# Patient Record
Sex: Male | Born: 1960 | Race: Asian | Hispanic: No | Marital: Single | Smoking: Current every day smoker
Health system: Southern US, Community
[De-identification: ages and names within clinical notes are randomized; demographics above are authoritative.]

## PROBLEM LIST (undated history)

## (undated) DIAGNOSIS — B20 Human immunodeficiency virus [HIV] disease: Secondary | ICD-10-CM

## (undated) DIAGNOSIS — A150 Tuberculosis of lung: Secondary | ICD-10-CM

## (undated) DIAGNOSIS — R519 Headache, unspecified: Secondary | ICD-10-CM

## (undated) DIAGNOSIS — M1990 Unspecified osteoarthritis, unspecified site: Secondary | ICD-10-CM

## (undated) DIAGNOSIS — Z21 Asymptomatic human immunodeficiency virus [HIV] infection status: Secondary | ICD-10-CM

## (undated) DIAGNOSIS — E119 Type 2 diabetes mellitus without complications: Secondary | ICD-10-CM

## (undated) HISTORY — DX: Type 2 diabetes mellitus without complications: E11.9

## (undated) HISTORY — DX: Unspecified osteoarthritis, unspecified site: M19.90

## (undated) HISTORY — DX: Headache, unspecified: R51.9

---

## 2002-12-25 ENCOUNTER — Encounter: Payer: Self-pay | Admitting: General Practice

## 2002-12-25 ENCOUNTER — Encounter: Admission: RE | Admit: 2002-12-25 | Discharge: 2002-12-25 | Payer: Self-pay | Admitting: General Practice

## 2008-01-08 ENCOUNTER — Inpatient Hospital Stay (HOSPITAL_COMMUNITY): Admission: EM | Admit: 2008-01-08 | Discharge: 2008-01-17 | Payer: Self-pay | Admitting: Family Medicine

## 2008-01-08 ENCOUNTER — Ambulatory Visit: Payer: Self-pay | Admitting: Pulmonary Disease

## 2008-01-08 DIAGNOSIS — R7611 Nonspecific reaction to tuberculin skin test without active tuberculosis: Secondary | ICD-10-CM | POA: Insufficient documentation

## 2008-01-09 ENCOUNTER — Encounter (INDEPENDENT_AMBULATORY_CARE_PROVIDER_SITE_OTHER): Payer: Self-pay | Admitting: Internal Medicine

## 2008-01-09 ENCOUNTER — Ambulatory Visit: Payer: Self-pay | Admitting: Infectious Diseases

## 2008-01-13 ENCOUNTER — Encounter: Payer: Self-pay | Admitting: Infectious Diseases

## 2008-01-14 ENCOUNTER — Encounter (INDEPENDENT_AMBULATORY_CARE_PROVIDER_SITE_OTHER): Payer: Self-pay | Admitting: Internal Medicine

## 2008-01-15 ENCOUNTER — Encounter (INDEPENDENT_AMBULATORY_CARE_PROVIDER_SITE_OTHER): Payer: Self-pay | Admitting: Pulmonary Disease

## 2008-02-04 ENCOUNTER — Encounter: Admission: RE | Admit: 2008-02-04 | Discharge: 2008-02-04 | Payer: Self-pay | Admitting: Infectious Diseases

## 2008-02-04 ENCOUNTER — Ambulatory Visit: Payer: Self-pay | Admitting: Infectious Diseases

## 2008-02-04 DIAGNOSIS — K219 Gastro-esophageal reflux disease without esophagitis: Secondary | ICD-10-CM

## 2008-02-04 DIAGNOSIS — F17200 Nicotine dependence, unspecified, uncomplicated: Secondary | ICD-10-CM | POA: Insufficient documentation

## 2008-02-04 LAB — CONVERTED CEMR LAB
ALT: 30 units/L (ref 0–53)
AST: 30 units/L (ref 0–37)
Albumin: 4.6 g/dL (ref 3.5–5.2)
Alkaline Phosphatase: 101 units/L (ref 39–117)
BUN: 20 mg/dL (ref 6–23)
CO2: 23 meq/L (ref 19–32)
Calcium: 9.5 mg/dL (ref 8.4–10.5)
Chloride: 104 meq/L (ref 96–112)
Creatinine, Ser: 0.93 mg/dL (ref 0.40–1.50)
Glucose, Bld: 82 mg/dL (ref 70–99)
HCT: 43.7 % (ref 39.0–52.0)
HIV 1 RNA Quant: 24700 copies/mL — ABNORMAL HIGH (ref <50)
HIV-1 RNA Quant, Log: 4.39 — ABNORMAL HIGH (ref <1.70)
Hemoglobin: 14.5 g/dL (ref 13.0–17.0)
MCHC: 33.2 g/dL (ref 30.0–36.0)
MCV: 81.8 fL (ref 78.0–100.0)
Platelets: 270 10*3/uL (ref 150–400)
Potassium: 4.3 meq/L (ref 3.5–5.3)
RBC: 5.34 M/uL (ref 4.22–5.81)
RDW: 14.6 % (ref 11.5–15.5)
Sodium: 137 meq/L (ref 135–145)
Total Bilirubin: 0.3 mg/dL (ref 0.3–1.2)
Total Protein: 8.5 g/dL — ABNORMAL HIGH (ref 6.0–8.3)
WBC: 11.2 10*3/uL — ABNORMAL HIGH (ref 4.0–10.5)

## 2008-02-22 ENCOUNTER — Encounter: Payer: Self-pay | Admitting: Infectious Diseases

## 2008-04-30 ENCOUNTER — Telehealth: Payer: Self-pay | Admitting: Infectious Diseases

## 2008-05-07 ENCOUNTER — Encounter: Payer: Self-pay | Admitting: Infectious Diseases

## 2008-05-09 ENCOUNTER — Encounter: Admission: RE | Admit: 2008-05-09 | Discharge: 2008-05-09 | Payer: Self-pay | Admitting: Infectious Diseases

## 2008-05-09 ENCOUNTER — Ambulatory Visit: Payer: Self-pay | Admitting: Infectious Diseases

## 2008-05-09 DIAGNOSIS — A150 Tuberculosis of lung: Secondary | ICD-10-CM | POA: Insufficient documentation

## 2008-05-09 LAB — CONVERTED CEMR LAB
ALT: 18 units/L (ref 0–53)
AST: 24 units/L (ref 0–37)
Basophils Absolute: 0 10*3/uL (ref 0.0–0.1)
Basophils Relative: 1 % (ref 0–1)
CO2: 26 meq/L (ref 19–32)
Chloride: 107 meq/L (ref 96–112)
HCV Ab: NEGATIVE
HIV 1 RNA Quant: 34000 copies/mL — ABNORMAL HIGH (ref <50)
Hep B S Ab: NEGATIVE
Lymphocytes Relative: 29 % (ref 12–46)
Neutro Abs: 2.9 10*3/uL (ref 1.7–7.7)
Platelets: 251 10*3/uL (ref 150–400)
RDW: 14.2 % (ref 11.5–15.5)
Sodium: 142 meq/L (ref 135–145)
Total Bilirubin: 0.3 mg/dL (ref 0.3–1.2)
Total Protein: 7.5 g/dL (ref 6.0–8.3)

## 2008-05-12 ENCOUNTER — Telehealth: Payer: Self-pay

## 2008-06-05 ENCOUNTER — Telehealth: Payer: Self-pay | Admitting: Infectious Diseases

## 2008-07-22 ENCOUNTER — Telehealth (INDEPENDENT_AMBULATORY_CARE_PROVIDER_SITE_OTHER): Payer: Self-pay | Admitting: *Deleted

## 2008-07-31 ENCOUNTER — Telehealth: Payer: Self-pay | Admitting: Infectious Diseases

## 2008-08-03 ENCOUNTER — Encounter: Payer: Self-pay | Admitting: Infectious Diseases

## 2008-08-05 ENCOUNTER — Telehealth: Payer: Self-pay

## 2008-08-06 ENCOUNTER — Ambulatory Visit: Payer: Self-pay | Admitting: Internal Medicine

## 2008-08-06 ENCOUNTER — Encounter: Payer: Self-pay | Admitting: Infectious Diseases

## 2008-08-06 DIAGNOSIS — R51 Headache: Secondary | ICD-10-CM

## 2008-08-06 DIAGNOSIS — R519 Headache, unspecified: Secondary | ICD-10-CM | POA: Insufficient documentation

## 2008-08-06 DIAGNOSIS — R42 Dizziness and giddiness: Secondary | ICD-10-CM

## 2008-08-14 ENCOUNTER — Ambulatory Visit (HOSPITAL_COMMUNITY): Admission: RE | Admit: 2008-08-14 | Discharge: 2008-08-14 | Payer: Self-pay | Admitting: Internal Medicine

## 2008-09-08 ENCOUNTER — Ambulatory Visit: Payer: Self-pay | Admitting: Infectious Diseases

## 2008-09-08 LAB — CONVERTED CEMR LAB
ALT: 23 units/L (ref 0–53)
AST: 27 units/L (ref 0–37)
Alkaline Phosphatase: 80 units/L (ref 39–117)
BUN: 17 mg/dL (ref 6–23)
Basophils Absolute: 0 10*3/uL (ref 0.0–0.1)
Basophils Relative: 1 % (ref 0–1)
Chloride: 102 meq/L (ref 96–112)
Creatinine, Ser: 0.97 mg/dL (ref 0.40–1.50)
Eosinophils Absolute: 0.3 10*3/uL (ref 0.0–0.7)
Eosinophils Relative: 5 % (ref 0–5)
Hemoglobin: 14.6 g/dL (ref 13.0–17.0)
MCHC: 32.8 g/dL (ref 30.0–36.0)
MCV: 84.1 fL (ref 78.0–100.0)
Monocytes Absolute: 0.6 10*3/uL (ref 0.1–1.0)
Monocytes Relative: 10 % (ref 3–12)
Neutro Abs: 2.7 10*3/uL (ref 1.7–7.7)
RDW: 14.1 % (ref 11.5–15.5)
Total Bilirubin: 0.4 mg/dL (ref 0.3–1.2)

## 2009-01-07 ENCOUNTER — Ambulatory Visit: Payer: Self-pay | Admitting: Infectious Diseases

## 2009-01-07 LAB — CONVERTED CEMR LAB
ALT: 11 units/L (ref 0–53)
AST: 21 units/L (ref 0–37)
Albumin: 4.6 g/dL (ref 3.5–5.2)
BUN: 18 mg/dL (ref 6–23)
Band Neutrophils: 0 % (ref 0–10)
CO2: 24 meq/L (ref 19–32)
Calcium: 9.4 mg/dL (ref 8.4–10.5)
Chloride: 104 meq/L (ref 96–112)
Cholesterol: 173 mg/dL (ref 0–200)
Creatinine, Ser: 1.08 mg/dL (ref 0.40–1.50)
Eosinophils Absolute: 0.4 10*3/uL (ref 0.0–0.7)
Eosinophils Relative: 6 % — ABNORMAL HIGH (ref 0–5)
HCT: 42.9 % (ref 39.0–52.0)
HDL: 36 mg/dL — ABNORMAL LOW (ref >39)
HIV-1 RNA Quant, Log: 4.55 — ABNORMAL HIGH (ref <1.68)
Hemoglobin: 14 g/dL (ref 13.0–17.0)
Hepatitis B Surface Ag: NEGATIVE
Lymphocytes Relative: 35 % (ref 12–46)
Lymphs Abs: 2.5 10*3/uL (ref 0.7–4.0)
MCHC: 32.6 g/dL (ref 30.0–36.0)
MCV: 83.6 fL (ref 78.0–100.0)
Monocytes Relative: 14 % — ABNORMAL HIGH (ref 3–12)
Potassium: 4.2 meq/L (ref 3.5–5.3)
RBC: 5.13 M/uL (ref 4.22–5.81)
WBC: 7.1 10*3/uL (ref 4.0–10.5)

## 2009-04-20 ENCOUNTER — Ambulatory Visit: Payer: Self-pay | Admitting: Infectious Diseases

## 2009-04-20 ENCOUNTER — Encounter (INDEPENDENT_AMBULATORY_CARE_PROVIDER_SITE_OTHER): Payer: Self-pay | Admitting: Licensed Clinical Social Worker

## 2009-04-20 LAB — CONVERTED CEMR LAB
ALT: 13 units/L (ref 0–53)
AST: 20 units/L (ref 0–37)
Albumin: 4.5 g/dL (ref 3.5–5.2)
Alkaline Phosphatase: 76 units/L (ref 39–117)
Basophils Absolute: 0 10*3/uL (ref 0.0–0.1)
Basophils Relative: 1 % (ref 0–1)
Eosinophils Absolute: 0.3 10*3/uL (ref 0.0–0.7)
Eosinophils Relative: 5 % (ref 0–5)
HCT: 41.3 % (ref 39.0–52.0)
HIV-1 RNA Quant, Log: 4.3 — ABNORMAL HIGH (ref <1.68)
Hemoglobin: 13.4 g/dL (ref 13.0–17.0)
Lymphocytes Relative: 28 % (ref 12–46)
MCHC: 32.4 g/dL (ref 30.0–36.0)
MCV: 82.1 fL (ref 78.0–100.0)
Monocytes Absolute: 0.7 10*3/uL (ref 0.1–1.0)
Platelets: 186 10*3/uL (ref 150–400)
Potassium: 4 meq/L (ref 3.5–5.3)
RDW: 14.1 % (ref 11.5–15.5)
Sodium: 141 meq/L (ref 135–145)
Total Bilirubin: 0.2 mg/dL — ABNORMAL LOW (ref 0.3–1.2)
Total Protein: 7.3 g/dL (ref 6.0–8.3)

## 2009-05-06 ENCOUNTER — Ambulatory Visit: Payer: Self-pay | Admitting: Infectious Diseases

## 2009-05-06 DIAGNOSIS — B20 Human immunodeficiency virus [HIV] disease: Secondary | ICD-10-CM | POA: Insufficient documentation

## 2009-06-15 ENCOUNTER — Ambulatory Visit: Payer: Self-pay | Admitting: Infectious Diseases

## 2009-06-16 ENCOUNTER — Ambulatory Visit: Payer: Self-pay | Admitting: Infectious Diseases

## 2009-09-09 ENCOUNTER — Ambulatory Visit: Payer: Self-pay | Admitting: Infectious Diseases

## 2009-09-09 DIAGNOSIS — R197 Diarrhea, unspecified: Secondary | ICD-10-CM

## 2009-09-09 DIAGNOSIS — K029 Dental caries, unspecified: Secondary | ICD-10-CM

## 2009-09-09 LAB — CONVERTED CEMR LAB
Albumin: 4.3 g/dL (ref 3.5–5.2)
Alkaline Phosphatase: 79 units/L (ref 39–117)
BUN: 11 mg/dL (ref 6–23)
Basophils Absolute: 0 10*3/uL (ref 0.0–0.1)
Calcium: 9 mg/dL (ref 8.4–10.5)
Chloride: 106 meq/L (ref 96–112)
Eosinophils Absolute: 0.2 10*3/uL (ref 0.0–0.7)
Eosinophils Relative: 3 % (ref 0–5)
Glucose, Bld: 93 mg/dL (ref 70–99)
HCT: 43.6 % (ref 39.0–52.0)
Hemoglobin: 13.7 g/dL (ref 13.0–17.0)
Lymphocytes Relative: 34 % (ref 12–46)
MCV: 85.3 fL (ref >=78.0)
Monocytes Absolute: 0.8 10*3/uL (ref 0.1–1.0)
Potassium: 4.2 meq/L (ref 3.5–5.3)
RDW: 13.6 % (ref 11.5–15.5)

## 2009-09-11 LAB — CONVERTED CEMR LAB: HIV-1 RNA Quant, Log: 4.98 — ABNORMAL HIGH (ref <1.68)

## 2009-09-15 ENCOUNTER — Ambulatory Visit: Payer: Self-pay | Admitting: Infectious Diseases

## 2009-12-09 ENCOUNTER — Ambulatory Visit: Payer: Self-pay | Admitting: Infectious Diseases

## 2009-12-09 LAB — CONVERTED CEMR LAB
AST: 22 units/L (ref 0–37)
Albumin: 4.2 g/dL (ref 3.5–5.2)
Alkaline Phosphatase: 76 units/L (ref 39–117)
BUN: 17 mg/dL (ref 6–23)
Bacteria, UA: NONE SEEN
Basophils Absolute: 0 10*3/uL (ref 0.0–0.1)
HDL: 39 mg/dL — ABNORMAL LOW (ref >39)
HIV 1 RNA Quant: 15500 copies/mL — ABNORMAL HIGH (ref <48)
HIV-1 RNA Quant, Log: 4.19 — ABNORMAL HIGH (ref <1.68)
Hemoglobin, Urine: NEGATIVE
Ketones, ur: NEGATIVE mg/dL
LDL Cholesterol: 96 mg/dL (ref 0–99)
Leukocytes, UA: NEGATIVE
Lymphocytes Relative: 44 % (ref 12–46)
Neutro Abs: 2.5 10*3/uL (ref 1.7–7.7)
Neutrophils Relative %: 37 % — ABNORMAL LOW (ref 43–77)
Platelets: 206 10*3/uL (ref 150–400)
Potassium: 4.4 meq/L (ref 3.5–5.3)
Protein, ur: NEGATIVE mg/dL
RBC / HPF: NONE SEEN (ref <3)
RDW: 14.3 % (ref 11.5–15.5)
Sodium: 140 meq/L (ref 135–145)
Total Protein: 7.3 g/dL (ref 6.0–8.3)
Urine Glucose: NEGATIVE mg/dL
WBC, UA: NONE SEEN cells/hpf (ref <3)

## 2009-12-23 ENCOUNTER — Encounter: Payer: Self-pay | Admitting: Infectious Diseases

## 2010-01-05 ENCOUNTER — Ambulatory Visit: Payer: Self-pay | Admitting: Infectious Diseases

## 2010-01-05 LAB — CONVERTED CEMR LAB: HIV-1 RNA Quant, Log: 3.51 — ABNORMAL HIGH (ref <1.68)

## 2010-01-06 ENCOUNTER — Ambulatory Visit: Payer: Self-pay | Admitting: Infectious Diseases

## 2010-01-07 ENCOUNTER — Encounter: Payer: Self-pay | Admitting: Infectious Diseases

## 2010-01-12 ENCOUNTER — Telehealth: Payer: Self-pay | Admitting: Infectious Diseases

## 2010-02-17 ENCOUNTER — Ambulatory Visit: Payer: Self-pay | Admitting: Infectious Diseases

## 2010-02-17 LAB — CONVERTED CEMR LAB
AST: 18 units/L (ref 0–37)
Alkaline Phosphatase: 81 units/L (ref 39–117)
BUN: 18 mg/dL (ref 6–23)
Basophils Absolute: 0 10*3/uL (ref 0.0–0.1)
Basophils Relative: 0 % (ref 0–1)
Calcium: 9 mg/dL (ref 8.4–10.5)
Creatinine, Ser: 0.87 mg/dL (ref 0.40–1.50)
Eosinophils Absolute: 0.3 10*3/uL (ref 0.0–0.7)
Eosinophils Relative: 3 % (ref 0–5)
Glucose, Bld: 80 mg/dL (ref 70–99)
HCT: 41.3 % (ref 39.0–52.0)
HIV-1 RNA Quant, Log: 4.71 — ABNORMAL HIGH (ref <1.68)
MCHC: 32.9 g/dL (ref 30.0–36.0)
MCV: 84.6 fL (ref 78.0–100.0)
Monocytes Absolute: 0.7 10*3/uL (ref 0.1–1.0)
Platelets: 176 10*3/uL (ref 150–400)
RDW: 13.7 % (ref 11.5–15.5)

## 2010-07-22 ENCOUNTER — Encounter (INDEPENDENT_AMBULATORY_CARE_PROVIDER_SITE_OTHER): Payer: Self-pay | Admitting: *Deleted

## 2010-11-21 ENCOUNTER — Encounter: Payer: Self-pay | Admitting: Infectious Diseases

## 2010-11-30 NOTE — Progress Notes (Signed)
Summary: [facility]- for assistance  Phone Note Outgoing Call   Call placed by: Tomasita Morrow RN,  January 12, 2010 9:52 AM Call placed to: [facility]- [facility] Summary of Call: Left message on voicemail:   Please call, we need you assistance in treating this patient. Initial call taken by: Tomasita Morrow RN,  January 12, 2010 9:53 AM  Follow-up for Phone Call        Made addition attempt to reach [facility] at [facility] . Pt will need  assistance with getting medication and treatment for parasites. Follow-up by: Tomasita Morrow RN,  January 15, 2010 1:52 PM  Additional Follow-up for Phone Call Additional follow up Details #1::        I spoke with [facility] from [facility].  The patient has completed his treatment and they no longer to see him.  She stated they would not be able to assit with the medication and should be called to a local pharmacy.  I will try to contact the patient again via phone.  Pt needs to be notified of lab results and med needs to be called to pharmacy of his choice.  Tomasita Morrow RN  January 25, 2010 10:01 AM     Additional Follow-up for Phone Call Additional follow up Details #2::    I have not been able to reach this patient. Multiple phone calls made and messages left.  He has not been treated for his parasites. Tomasita Morrow RN  April 05, 2010 11:19 AM

## 2010-11-30 NOTE — Assessment & Plan Note (Signed)
Summary: FU VISIT/DS-CARE MGT CALLED   CC:  follow-up visit and worrying about dx of hookworm.  Preventive Screening-Counseling & Management  Alcohol-Tobacco     Alcohol drinks/day: 0     Smoking Status: current     Smoking Cessation Counseling: yes     Packs/Day: 1.0     Year Quit: 2009     Tobacco Counseling: to quit use of tobacco products  Caffeine-Diet-Exercise     Caffeine use/day: coffee     Does Patient Exercise: yes     Type of exercise: walking     Exercise (avg: min/session): 30-60     Times/week: <3      Sexual History:  currently monogamous.        Drug Use:  crack cocaine.     Current Allergies (reviewed today): No known allergies  Social History: Drug Use:  crack cocaine Sexual History:  currently monogamous Flu Vaccine Consent Questions     Do you have a history of severe allergic reactions to this vaccine? no    Any prior history of allergic reactions to egg and/or gelatin? no    Do you have a sensitivity to the preservative Thimersol? no    Do you have a past history of Guillan-Barre Syndrome? no    Do you currently have an acute febrile illness? no    Have you ever had a severe reaction to latex? no    Vaccine information given and explained to patient? yes    Are you currently pregnant? no    Lot [id number] P   Exp Date:02/17/2010   Manufacturer: [employer]    Site Given  Left Deltoid IM Wendall Mola CMA ( AAMA)  January 05, 2010 9:52 AM   Vital Signs:  Patient profile:   50 year old male Height:      64 inches (162.56 cm) Weight:      110.3 pounds (50.14 kg) BMI:     19.00 Temp:     95.8 degrees F (35.44 degrees C) oral Pulse rate:   59 / minute BP sitting:   129 / 77  (left arm)  Vitals Entered By: Wendall Mola CMA Duncan Dull) (January 05, 2010 9:12 AM) CC: follow-up visit, worrying about dx of hookworm Is Patient Diabetic? No Pain Assessment Patient in pain? no      Nutritional Status BMI of < 19 =  underweight Nutritional Status Detail appetite "good"  Does patient need assistance? Functional Status Self care Ambulation Normal Comments missed meds one day    Other Orders: Admin 1st Vaccine (37628) Flu Vaccine 69yrs + (31517)

## 2010-11-30 NOTE — Miscellaneous (Signed)
Summary: RW update  Clinical Lists Changes  Observations: Added new observation of RWPARTICIP: Yes (07/22/2010 9:44)

## 2010-11-30 NOTE — Assessment & Plan Note (Signed)
Summary: 1 month f/u vs   CC:  1 month follow up.  History of Present Illness: 50 yo M Eric Jefferson who was adm to [facility] on 01-08-08 with a 10 day history of cough, yellow sputum and wt loss. He had a CXR and CT scan which showed a cavitrary LUL mass. He had 3 afbs negative as well as a bronchoscopy (01-15-08) which grew a pansensitive M tuberculosis. As part of his workup he had a HIV elisa that was + and VL 52,400 and CD4 510. Genotype y181c, l210w, t215d, m46l. Completed his TB therapy 11-09. Last CD4 520 and VL 15,500 (12-09-09).  Previously started on DRVr/TRV.  Noted to have tenia eggs in stool at last visit but did not pick up deworming agent.  has been doing well otherwise. would like more medicine as he only has 2 days left.   Preventive Screening-Counseling & Management  Alcohol-Tobacco     Alcohol drinks/day: 0     Smoking Status: current     Smoking Cessation Counseling: yes     Packs/Day: 1.0     Year Quit: 2009     Tobacco Counseling: to quit use of tobacco products  Caffeine-Diet-Exercise     Caffeine use/day: coffee     Does Patient Exercise: yes     Type of exercise: walking and soccer     Exercise (avg: min/session): 30-60     Times/week: <3  Safety-Violence-Falls     Seat Belt Use: yes   Updated Prior Medication List: PRILOSEC 40 MG CAPDR (OMEPRAZOLE) Take one (1) by mouth once a day NORVIR 100 MG CAPS (RITONAVIR) 1 by mouth once daily PREZISTA 400 MG TABS (DARUNAVIR ETHANOLATE) 2 tab by mouth once daily TRUVADA 200-300 MG TABS (EMTRICITABINE-TENOFOVIR) Take 1 tablet by mouth once a day BILTRICIDE 600 MG TABS (PRAZIQUANTEL) 2 tab by mouth x 1  Current Allergies (reviewed today): No known allergies  Past History:  Past medical, surgical, family and social histories (including risk factors) reviewed, and no changes noted (except as noted below).  Past Medical History: GERD HIV disease   Genotype 01-05-10 RT: Y181C,L210W,T215D;    PR:M46L Pulmonary TB  6-09 Tenia in Stool  Family History: Reviewed history from 02/04/2008 and no changes required. 7 children  Social History: Reviewed history from 02/04/2008 and no changes required. Married Current Smoker Alcohol use-no previously in [place] [facility] in [place] since 2003  Review of Systems       no dizzyness, having stomach pain, diarrhea.   Vital Signs:  Patient profile:   50 year old male Height:      64 inches (162.56 cm) Weight:      111.8 pounds (50.82 kg) BMI:     19.26 Temp:     98.0 degrees F (36.67 degrees C) oral Pulse rate:   69 / minute BP sitting:   121 / 84  (right arm)  Vitals Entered By: Baxter Hire) (February 17, 2010 9:31 AM) CC: 1 month follow up Pain Assessment Patient in pain? no      Nutritional Status BMI of 19 -24 = normal Nutritional Status Detail appetite is good per patient  Does patient need assistance? Functional Status Self care Ambulation Normal   Physical Exam  General:  well-developed, well-nourished, well-hydrated, and underweight appearing.   Eyes:  pupils equal, pupils round, and pupils reactive to light.   Mouth:  pharynx pink and moist and no exudates.   Neck:  no masses.   Lungs:  normal respiratory effort  and normal breath sounds.   Heart:  normal rate, regular rhythm, and no murmur.   Abdomen:  soft, non-tender, and normal bowel sounds.          Medication Adherence: 02/17/2010   Adherence to medications reviewed with patient. Counseling to provide adequate adherence provided   Prevention For Positives: 02/17/2010   Safe sex practices discussed with patient. Condoms offered.                             Impression & Recommendations:  Problem # 1:  HIV DISEASE (ICD-042) doing well clinically. he cont to be non-suppressed. ? add ISN. recheck his labs today.  given condoms. return to clinic 3-4 months  Problem # 2:  DIARRHEA (ICD-787.91) will give him rx for praziquantel as prev written.   Other  Orders: Est. Patient Level IV [id number]) T-CD4SP ([facility]) (CD4SP) T-HIV Viral Load [phone]) T-Comprehensive Metabolic Panel [id number]) T-CBC w/Diff ([id number])  Prescriptions: BILTRICIDE 600 MG TABS (PRAZIQUANTEL) 2 tab by mouth x 1  #2 x 0   Entered and Authorized by:   Johny Sax MD   Signed by:   Johny Sax MD on 02/17/2010   Method used:   Electronically to        [facility] [address] [phone]* (retail)       [address]       [place], [place]  [address]       Ph: [phone] or [phone]       Fax: [phone]   RxID:   [id number] TRUVADA 200-300 MG TABS (EMTRICITABINE-TENOFOVIR) Take 1 tablet by mouth once a day  #90 x 3   Entered and Authorized by:   Johny Sax MD   Signed by:   Johny Sax MD on 02/17/2010   Method used:   Electronically to        [facility] [address] [phone]* (retail)       [address]       [place], [place]  [address]       Ph: [phone] or [phone]       Fax: [phone]   RxID:   [id number] PREZISTA 400 MG TABS (DARUNAVIR ETHANOLATE) 2 tab by mouth once daily  #90 x 3   Entered and Authorized by:   Johny Sax MD   Signed by:   Johny Sax MD on 02/17/2010   Method used:   Electronically to        [facility] [address] [phone]* (retail)       [address]       [place], [place]  [address]       Ph: [phone] or [phone]       Fax: [phone]   RxID:   [id number] NORVIR 100 MG CAPS (RITONAVIR) 1 by mouth once daily  #90 x 3   Entered and Authorized by:   Johny Sax MD   Signed by:   Johny Sax MD on 02/17/2010   Method used:   Electronically to        [facility] [address] [phone]* (retail)       [address]       [place], [place]  [address]  Ph: [phone] or [phone]       Fax: [phone]   RxID:   [id number]  Process Orders Check Orders Results:     [facility]: ABN not required for this insurance Tests Sent for requisitioning (February 17, 2010 10:12 AM):     02/17/2010: [facility] -- T-HIV Viral Load [phone] (signed)     02/17/2010: [facility] -- T-Comprehensive Metabolic Panel [[id number]] (signed)     02/17/2010: [facility] -- [facility] w/Diff [[id number]] (signed)

## 2010-11-30 NOTE — Assessment & Plan Note (Signed)
Summary: 3 MONTH CHECKUP/CH   History of Present Illness: 50 yo M montangard who was adm to [facility] on 01-08-08 with a 10 day history of cough, yellow sputum and wt loss. He had a CXR and CT scan which showed a cavitrary LUL mass. He had 3 afbs negative as well as a bronchoscopy (01-15-08) which grew a pansensitive M tuberculosis. As part of his workup he had a HIV elisa that was + and VL 52,400 and CD4 510. Genotype y181c, l210w, t215d, m46l. Completed his TB therapy 11-09. Last CD4 520 and VL 15,500 (12-09-09).  Previously started on DRVr/TRV.  Was noted to have worm in stool after deworming medicine in August of this year. His interpreter would like this eval.  Apetite is better. No problems with HIV meds. No probs with BM. have some occas diarrhea.    Updated Prior Medication List: PRILOSEC 40 MG CAPDR (OMEPRAZOLE) Take one (1) by mouth once a day NORVIR 100 MG CAPS (RITONAVIR) 1 by mouth once daily PREZISTA 400 MG TABS (DARUNAVIR ETHANOLATE) 2 tab by mouth once daily TRUVADA 200-300 MG TABS (EMTRICITABINE-TENOFOVIR) Take 1 tablet by mouth once a day  Current Allergies: No known allergies  Past History:  Past medical, surgical, family and social histories (including risk factors) reviewed, and no changes noted (except as noted below).  Past Medical History: Reviewed history from 05/06/2009 and no changes required. GERD HIV disease Pulmonary TB 6-09  Current Medications (verified): 1)  Prilosec 40 Mg Capdr (Omeprazole) .... Take One (1) By Mouth Once A Day 2)  Norvir 100 Mg Caps (Ritonavir) .Marland Kitchen.. 1 By Mouth Once Daily 3)  Prezista 400 Mg Tabs (Darunavir Ethanolate) .... 2 Tab By Mouth Once Daily 4)  Truvada 200-300 Mg Tabs (Emtricitabine-Tenofovir) .... Take 1 Tablet By Mouth Once A Day  Allergies: No Known Drug Allergies   Family History: Reviewed history from 02/04/2008 and no changes required. 7 children  Social History: Reviewed history from 02/04/2008 and no changes  required. Married Current Smoker Alcohol use-no previously in [place] jail in [place] since 2003  Vital Signs:  Patient profile:   51 year old male Height:      64 inches Weight:      110.3 pounds BMI:     19.00 Temp:     95.8 degrees F Pulse rate:   59 / minute BP sitting:   129 / 77  (left arm)  Physical Exam  General:  underweight appearing.   Eyes:  pupils equal, pupils round, and pupils reactive to light.   Mouth:  pharynx pink and moist and no exudates.   Neck:  no masses.   Lungs:  normal respiratory effort and normal breath sounds.   Heart:  normal rate, regular rhythm, and no murmur.   Abdomen:  soft, non-tender, and normal bowel sounds.          Medication Adherence: 01/05/2010   Adherence to medications reviewed with patient. Counseling to provide adequate adherence provided   Prevention For Positives: 01/05/2010   Safe sex practices discussed with patient. Condoms offered.                             Impression & Recommendations:  Problem # 1:  HIV DISEASE (ICD-042)  will recheck his CD4  and VL as well as geno today. not sure if he is taking his meds. he is given condoms. needs flu shot. return to clinic 1 month  Orders:  T- * Misc. Laboratory test [phone]) T-Culture, Stool ([id number])  Problem # 2:  DENTAL CARIES (ICD-521.00) will try to get him appt made today.   Problem # 3:  DIARRHEA (ICD-787.91) will recheck his O & P. presumed 2dary to RTV?  Other Orders: T-CD4SP ([facility]) (CD4SP) T-HIV Genotype [phone]) T-HIV Viral Load [phone]) Est. Patient Level IV ([address])  Process Orders Check Orders Results:     [facility]: ABN not required for this insurance Tests Sent for requisitioning (January 05, 2010 9:43 AM):     12/23/2009: [facility] -- T-HIV Genotype [[id number]] (signed)     12/23/2009: [facility] -- T-HIV Viral Load [phone] (signed)     12/23/2009: [facility] -- T- * Misc. Laboratory test [phone] (signed)     12/23/2009: [facility] -- T-Culture, Stool [[id number]] (signed)   Appended Document: Orders Update    Clinical Lists Changes  Orders: Added new Service order of Est. Patient Level IV (60630) - Signed

## 2010-11-30 NOTE — Miscellaneous (Signed)
Summary: Lab orders  Clinical Lists Changes  Problems: Added new problem of ENCOUNTER FOR LONG-TERM USE OF OTHER MEDICATIONS (ICD-V58.69) Orders: Added new Test order of T-CBC w/Diff [phone]) - Signed Added new Test order of T-CD4SP [facility]) (CD4SP) - Signed Added new Test order of T-Chlamydia  Probe, urine [phone]) - Signed Added new Test order of T-GC Probe, urine [phone]) - Signed Added new Test order of T-Comprehensive Metabolic Panel [phone]) - Signed Added new Test order of T-HIV Viral Load [phone]) - Signed Added new Test order of T-Lipid Profile ([phone]) - Signed Added new Test order of T-RPR (Syphilis) ([id number]) - Signed Added new Test order of T-Urinalysis ([id number]) - Signed     Process Orders Check Orders Results:     [facility]: ABN not required for this insurance Order queued for requisitioning for [facility]: December 09, 2009 8:59 AM  Tests Sent for requisitioning (December 09, 2009 8:59 AM):     12/09/2009: [facility] -- T-CBC w/Diff [[id number]] (signed)     12/09/2009: [facility] -- [employer], urine [phone] (signed)     12/09/2009: [facility] -- T-GC Probe, urine [phone] (signed)     12/09/2009: [facility] -- T-Comprehensive Metabolic Panel [[id number]] (signed)     12/09/2009: [facility] -- T-HIV Viral Load [phone] (signed)     12/09/2009: [facility] -- T-Lipid Profile [phone] (signed)     12/09/2009: [facility] -- T-RPR (Syphilis) [phone] (signed)     12/09/2009: [facility] -- T-Urinalysis [[id number]] (signed)

## 2010-12-02 ENCOUNTER — Encounter (INDEPENDENT_AMBULATORY_CARE_PROVIDER_SITE_OTHER): Payer: Self-pay | Admitting: *Deleted

## 2010-12-08 ENCOUNTER — Encounter (INDEPENDENT_AMBULATORY_CARE_PROVIDER_SITE_OTHER): Payer: Self-pay | Admitting: *Deleted

## 2010-12-08 NOTE — Miscellaneous (Signed)
  Clinical Lists Changes  Observations: Added new observation of INCOMESOURCE: UNKNOWN (12/02/2010 14:02) Added new observation of HOUSEINCOME: 0  (12/02/2010 14:02) Added new observation of #CHILD<18 IN: Unknown  (12/02/2010 14:02) Added new observation of FAMILYSIZE: 1  (12/02/2010 14:02) Added new observation of HOUSING: Unknown  (12/02/2010 14:02) Added new observation of YEARLYEXPEN: 0  (12/02/2010 14:02)

## 2010-12-09 ENCOUNTER — Encounter (INDEPENDENT_AMBULATORY_CARE_PROVIDER_SITE_OTHER): Payer: Self-pay | Admitting: *Deleted

## 2010-12-16 NOTE — Miscellaneous (Signed)
  Clinical Lists Changes  Observations: Added new observation of PAYOR: No Insurance (12/09/2010 14:27)

## 2010-12-16 NOTE — Miscellaneous (Signed)
  Clinical Lists Changes 

## 2010-12-22 ENCOUNTER — Encounter (INDEPENDENT_AMBULATORY_CARE_PROVIDER_SITE_OTHER): Payer: Self-pay | Admitting: *Deleted

## 2010-12-23 ENCOUNTER — Encounter (INDEPENDENT_AMBULATORY_CARE_PROVIDER_SITE_OTHER): Payer: Self-pay | Admitting: *Deleted

## 2010-12-24 ENCOUNTER — Encounter: Payer: Self-pay | Admitting: Infectious Diseases

## 2010-12-28 NOTE — Miscellaneous (Signed)
  Clinical Lists Changes  Observations: Added new observation of HOUSING: stable/permanent (10/19/2010 10:16)

## 2010-12-28 NOTE — Miscellaneous (Signed)
  Clinical Lists Changes  Observations: Added new observation of HOUSING: Stable/permanent (12/22/2010 17:24)

## 2010-12-28 NOTE — Miscellaneous (Addendum)
  Clinical Lists Changes  Orders: Added new Test order of T-CBC w/Diff [phone]) - Signed Added new Test order of T-CD4SP [facility]) (CD4SP) - Signed Added new Test order of T-Comprehensive Metabolic Panel [phone]) - Signed Added new Test order of T-HIV Viral Load [phone]) - Signed     Process Orders Check Orders Results:     [facility]: ABN not required for this insurance Tests Sent for requisitioning (December 24, 2010 8:46 AM):     12/24/2010: [facility] -- T-CBC w/Diff [[id number]] (signed)     12/24/2010: [facility] -- T-Comprehensive Metabolic Panel [[id number]] (signed)     12/24/2010: [facility] -- T-HIV Viral Load [phone] (signed)

## 2011-01-10 ENCOUNTER — Other Ambulatory Visit: Payer: Self-pay

## 2011-01-10 ENCOUNTER — Ambulatory Visit: Payer: Self-pay

## 2011-01-11 ENCOUNTER — Encounter: Payer: Self-pay | Admitting: Infectious Disease

## 2011-01-18 LAB — T-HELPER CELL (CD4) - (RCID CLINIC ONLY): CD4 % Helper T Cell: 17 % — ABNORMAL LOW (ref 33–55)

## 2011-01-19 LAB — T-HELPER CELL (CD4) - (RCID CLINIC ONLY): CD4 T Cell Abs: 520 uL (ref 400–2700)

## 2011-01-23 LAB — T-HELPER CELL (CD4) - (RCID CLINIC ONLY): CD4 % Helper T Cell: 22 % — ABNORMAL LOW (ref 33–55)

## 2011-01-24 ENCOUNTER — Ambulatory Visit: Payer: Self-pay | Admitting: Infectious Diseases

## 2011-01-28 ENCOUNTER — Ambulatory Visit: Payer: Self-pay | Admitting: Infectious Diseases

## 2011-02-07 LAB — T-HELPER CELL (CD4) - (RCID CLINIC ONLY)
CD4 % Helper T Cell: 21 % — ABNORMAL LOW (ref 33–55)
CD4 T Cell Abs: 340 uL — ABNORMAL LOW (ref 400–2700)

## 2011-02-10 LAB — T-HELPER CELL (CD4) - (RCID CLINIC ONLY)
CD4 % Helper T Cell: 14 % — ABNORMAL LOW (ref 33–55)
CD4 T Cell Abs: 410 uL (ref 400–2700)

## 2011-03-15 NOTE — Consult Note (Signed)
NAME:  Eric Jefferson, Eric Jefferson NO.:  [id number]   MEDICAL RECORD NO.:  [id number]          PATIENT TYPE:  INP   LOCATION:  5114                         FACILITY:  [facility]   PHYSICIAN:  Felipa Evener, MD  DATE OF BIRTH:  26-Jul-1961   DATE OF CONSULTATION:  DATE OF DISCHARGE:                                 CONSULTATION   REASON FOR CONSULTATION:  Left upper lobe mass.   HISTORY OF PRESENT ILLNESS:  The patient is a 50 year old [place]  male patient with no significant past medical history who presents to  the hospital with pleuritic chest pains, some chills, night sweats with  no fever, also reports some cough productive of white to yellow sputum  with no hemoptysis, no weight loss, no sick contacts, no rashes, no  recent travel.  He denies any BCG immunization and does report some  night sweats.   PAST MEDICAL HISTORY:  Negative.   PAST SURGICAL HISTORY:  Negative.   MEDICATIONS:  None.   ALLERGIES:  No known drug allergies.   FAMILY HISTORY:  Noncontributory.   SOCIAL HISTORY:  Smokes one pack per day for the past 2-3 years but has  never been a smoker before.  No significant pulmonary environmental  exposures.  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   REVIEW OF SYSTEMS:  A 12-point review of systems was performed and was  negative other than the above.   PHYSICAL EXAMINATION:  GENERAL:  Well-appearing male lying comfortably  in bed in no acute distress.  VITAL SIGNS:  Temperature is 98.6, heart rate is 78, respiratory rate is  18, saturation 99% on room air, blood pressure 138/86.  HEENT:  Normocephalic atraumatic.  Pupils are equal, round, and reactive  to light.  Extraocular movements are intact.  Oral and nasal mucosa  within normal limits.  NECK:  Revealed no thyromegaly.  No lymphadenopathy.  Normal jugular  venous pressure.  HEART:  Regular rate and rhythm.  S1 S2.  No murmurs, rubs, or  gallops.  LUNGS:  Clear to auscultation bilaterally.  ABDOMEN:  Soft, nontender, nondistended.  Positive bowel sounds.  EXTREMITIES:  No edema.  No tenderness.  NEUROLOGIC:  Grossly intact.   LABORATORY STUDIES:  Reviewed significant for chest CT that was reviewed  personally showed the left upper lobe scattered lesions with evidence of  scarring in that area suspicious for old tuberculosis infection or  potentially an active one.  The chest CT is otherwise normal.  The  patient had 3 negative AFBs already.   ASSESSMENT/PLAN:  The patient is a 50 year old male with a negative past  medical history presenting with pleuritic chest pain, cough, and night  sweats concerning for tuberculosis, even though the patient has had  three negative AFBs.  We will still keep the patient in isolation.  PPD  could be placed.  The patient is to be taken to the bronchoscopy lab in  the morning for a BAL of the left upper lobe as a confirmatory test of  active tuberculosis.  Depending on the results of the PPD, we will give  further recommendations as the need for INH or further treatment.      Felipa Evener, MD  Electronically Signed     WJY/MEDQ  D:  01/14/2008  T:  01/14/2008  Job:  [id number]

## 2011-03-15 NOTE — Discharge Summary (Signed)
Eric Jefferson, HAMMAN NO.:  [id number]   MEDICAL RECORD NO.:  [id number]          PATIENT TYPE:  INP   LOCATION:  5114                         FACILITY:  [facility]   PHYSICIAN:  Hillery Aldo, M.D.   DATE OF BIRTH:  1961/08/18   DATE OF ADMISSION:  01/08/2008  DATE OF DISCHARGE:  01/17/2008                               DISCHARGE SUMMARY   PRIMARY CARE PHYSICIAN:  None.   INFECTIOUS DISEASE PHYSICIAN:  Referred to Dr. Ninetta Jefferson for outpatient  followup.   DISCHARGE DIAGNOSES:  1. Cavitary pneumonia.  2. Newly diagnosed human immunodeficiency virus with a CD4 count of      510.   DISCHARGE MEDICATION:  Augmentin 875 mg b.i.d. x2 weeks.   CONSULTATIONS:  1. Dr. Ninetta Jefferson of Infectious Disease.  2. Dr. Molli Knock of Pulmonary Critical Care Medicine.   BRIEF ADMISSION AND HISTORY OF PRESENT ILLNESS:  The patient is a 50-  year-old [place] male who presented to the hospital with a chief  complaint of pleuritic chest pain and cough.  He also complained of  anorexia, lethargy, and weight loss.  For the full details, please see  the dictated report done by Dr. [name].   PROCEDURES AND DIAGNOSTIC STUDIES:  1. Chest x-ray on January 08, 2008, showed indeterminate peripheral      pleural parenchymal density at the left apex, the acuteness of      which was uncertain.  2. CT scan of the chest on January 08, 2008, showed left upper lobe      apical pulmonary nodules, masses, and tree-in-bud micronodules.      These findings were suspicious for Mycobacterium tuberculosis.      Other considerations included nontuberculous granulomatous disease      such as fungal infection and pulmonary neoplasm.  3. Bronchoscopy with bronchial washings sent for cytology, routine      culture, AFB smears and cultures, and fungal smears and cultures      done on January 15, 2008, by Dr. Molli Knock.   DISCHARGE LABORATORY VALUES:  To date, the patient's cultures have  remained negative, but are still  pending in terms of the final report.  All AFB smears have been negative.  HIV RNA quantitative results were  52,400 with a log of 4.72.  Hepatitis studies were negative.  GC and  Chlamydia probes were negative.  Syphilis screening was negative.   HOSPITAL COURSE BY PROBLEM:  1. Cavitary pneumonia:  The patient was admitted and initially put on      Zosyn.  Due to concerns for possibility of pulmonary tuberculosis,      the patient was kept on respiratory isolation.  Three AFB sputum      smears were negative.  Given the high index of suspicion for      pulmonary TB, a pulmonary critical care consult was requested and      kindly proved by Dr. Molli Knock who took the patient for bronchoscopy      with bronchial washings.  Preliminary reports do show any evidence      of malignancy.  Final culture data is still pending, but to date      all data has been negative.  The patient has been transitioned over      to p.o. Avelox with Augmentin and will complete an additional 2      weeks of therapy with Augmentin.  He will follow up with Dr.      Ninetta Jefferson in the ID Clinic.  2. Newly diagnosed HIV:  The patient's CD4 count 510.  He does not      need PCP or MAC prophylaxis at this time.  He will follow up with      Dr. Ninetta Jefferson in the ID Clinic.  An appointment has been made for the      patient on February 04, 2008, at 11:15 a.m.  The interpreter service      will be used to ensure the patient understands these discharge      instructions.   DISPOSITION:  The patient is medically stable and cleared for discharge  by the infection disease consultant.      Hillery Aldo, M.D.  Electronically Signed     CR/MEDQ  D:  01/17/2008  T:  01/18/2008  Job:  [id number]   cc:   Lacretia Leigh. Eric Jefferson, M.D.

## 2011-03-15 NOTE — Discharge Summary (Signed)
Eric Jefferson, Eric Jefferson NO.:  [id number]   MEDICAL RECORD NO.:  [id number]          PATIENT TYPE:  INP   LOCATION:  5114                         FACILITY:  [facility]   PHYSICIAN:  Eduard Clos, MDDATE OF BIRTH:  [birth year]   DATE OF ADMISSION:  01/08/2008  DATE OF DISCHARGE:  01/15/2008                               DISCHARGE SUMMARY   COURSE IN HOSPITAL:  A 50 year old male with no significant past medical  history who presented to the ER complaining of chest pain.  The patient  had chest pain on the right side which was pleuritic in nature.  On  admission, the patient had a CT scan of the chest which showed  left  upper lobe apical pulmonary nodules, masses tree-in-bud nodules.  Findings were highly suspicious for Mycobacterium tuberculosis. Also  consider nontuberculous  disease such as fungal infection and Carcinoma.  The patient was admitted to isolation, and PPD was done . The patient's  PPD turned out to be positive . Infectious disease was consulted.  The  patient had sputum sent which all three turned out to be negative.  At  this time with sputum AFB being negative with left lung mass, pulmonary  consult was obtained.  The patient underwent bronchoscopy, and  bronchoalveolar fluid was sent for AFB culture tests which are all  pending now during his stay.  Also ordered patient HIV test which turned  out to be positive which results were conveyed to patient through an  interpreter, and now the patient is aware of these results.  At this  moment, we are awaiting bronchioalveolar lavage results.  Until then,  the patient will be in isolation.  The patient's cardiac enzymes were  negative during this stay.   ASSESSMENT:  1. 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  2. Human immunodeficiency virus positive.  3. PPD positive.  4. Atypical chest pain.   PLAN:  Awaiting bronchioalveolar lavage results, and further  recommendations will be done at that  time.      Eduard Clos, MD  Electronically Signed     ANK/MEDQ  D:  01/15/2008  T:  01/15/2008  Job:  [id number]

## 2011-03-15 NOTE — Op Note (Signed)
Eric Jefferson, OSEI NO.:  [id number]   MEDICAL RECORD NO.:  [id number]          PATIENT TYPE:  INP   LOCATION:  5114                         FACILITY:  [facility]   PHYSICIAN:  Felipa Evener, MD  DATE OF BIRTH:  1961/02/12   DATE OF PROCEDURE:  DATE OF DISCHARGE:                               OPERATIVE REPORT   PROCEDURE PERFORMED:  1. Fiberoptic bronchoscopy.  2. Bronchioalveolar lavage.   INDICATIONS FOR PROCEDURE:  Left upper lobe lesions in a patient with  positive PPD and from an endemic area.   The patient was placed in negative pressure room after explaining the  risks and benefits of the procedure to the patient including hemothorax,  pneumothorax, vocal cord trauma, and drug reaction, the patient signed  informed consent. The procedure was performed in the negative pressure  room in the bronchoscopy suite with __________ isolation. The patient  was given 2 mg IV Versed and 1 mcg IV Fentanyl.  Bronchoscope was passed  through the patient's right naris where 5 cc of 1% lidocaine was  instilled into the cord.  The appeared fluctuant within normal limits.  The bronchoscope was passed through the cord to the carina where 5 cc of  1% lidocaine was instilled into the carina.  The bronchoscope was then  passed into the left mainstem bronchus.  Left upper lobe lingula and  lower lobe all appeared to be within normal limits.  Bronchoscope was  wedged in the left upper lobe apical segment and bronchoalveolar lavage  was performed in that area. Secretions were suctioned, bronchoscope  withdrawn through the carina and back into the right mainstem bronchus.  Right upper lobe, middle and lower lobes all appeared to be within  normal limits. The bronchoscope was then wedged in the right upper lobe  and bronchoalveolar lavage was performed in that area. All secretions  were suctioned.  Bronchoscope was withdrawn to the carina outside the  patient. The patient  tolerated the procedure well with no complications.  Samples are to be sent for bacterial, fungal, AV stain and culture as  well as cell count with differential and cytologic evaluation.      Felipa Evener, MD  Electronically Signed     WJY/MEDQ  D:  01/15/2008  T:  01/15/2008  Job:  [id number]

## 2011-03-15 NOTE — H&P (Signed)
NAME:  Eric Jefferson, Eric Jefferson NO.:  [id number]   MEDICAL RECORD NO.:  [id number]          PATIENT TYPE:  EMS   LOCATION:  MAJO                         FACILITY:  [facility]   PHYSICIAN:  Hettie Holstein, D.O.    DATE OF BIRTH:  [birth year]   DATE OF ADMISSION:  01/08/2008  DATE OF DISCHARGE:                              HISTORY & PHYSICAL   PRIMARY CARE PHYSICIAN:  Unassigned.   CHIEF COMPLAINT:  Pleuritic chest pain and cough.   HISTORY OF PRESENT ILLNESS:  Please note that all history is obtained  through [facility] interpreters, though the equipment was malfunctioning in  the emergency department today.  Only one phone was working and  reception was very limited and poor.  However, it was discerned that Mr.  Jefferson had been having complaints for the past 10 days, with a productive  cough of yellow and whitish sputum and nonbloody.  This had been  occurring for about the past 10 days.  He also reported subjective  weight loss, anorexia and lethargy.  He denies any other medical  problems.  He has had some night sweats, but no fevers.  He denies any  recent travel or sick contacts.  He otherwise has had no other  complaints.  He denies ever having been immunized, treated or tested for  tuberculosis.   PAST MEDICAL HISTORY:  Unremarkable.   MEDICATIONS:  None.   ALLERGIES:  NO KNOWN DRUG ALLERGIES.   SOCIAL HISTORY:  He smokes tobacco.  He is married with children.  He  works for a Development worker, community.   FAMILY HISTORY:  Unremarkable.   REVIEW OF SYSTEMS:  He only reports night sweats, weight loss, decreased  appetite, the pleuritic chest pain as described above and decreased  energy.  Further review of systems is unremarkable.   PHYSICAL EXAMINATION:  In the emergency department his blood pressure is  121/78, heart rate 53, respirations 80, O2 saturation 100%, temperature  96.4.  HEENT:  His head is normocephalic and atraumatic.  His extraocular  muscles are  intact.  NECK:  Supple and nontender.  There was no palpable thyromegaly or mass.  CARDIOVASCULAR:  Exam reveals normal S1 and S2.  LUNGS:  Exhibits normal effort and there is no dullness to percussion.  He does exhibit some left-sided wheezing that is very mild.  ABDOMEN:  Soft.  EXTREMITIES:  Lower extremities reveal no edema.  SKIN:  Reveals multiple tattoos on his arms, as well as his chest and  sternum.   LABORATORY DATA:  WBC 7000, hemoglobin 45, platelet count 251, MCV 83.  Sodium 140, potassium 4.4, BUN 12, creatinine 1.0, glucose 85.   CT SCAN:  This revealed left apical lung masses, lung nodules and tree  and bud micro nodules; worrisome for tuberculosis, though the  differential includes a fungal infection or bronchogenic carcinoma.   ASSESSMENT:  1. Pleuritic chest pain.  2. Left apical lung mass suspicious for tuberculosis.  3. Weight loss.  4. Anorexia.   PLAN:  [name] will be admitted.  We will induce sputum  and obtain an  AFB smear to rule out active tuberculosis.  We will also obtain cytology  and proceed from workup, following 3 sets of sputum evaluations.  I have  already contacted Dr. Sampson Goon of Infectious Disease, who will also  provide any further input.  He will be placed in airborne precautions.      Hettie Holstein, D.O.  Electronically Signed     ESS/MEDQ  D:  01/08/2008  T:  01/09/2008  Job:  [id number]

## 2011-03-15 NOTE — Consult Note (Signed)
NAME:  DELSON, DULWORTH NO.:  [id number]   MEDICAL RECORD NO.:  [id number]          PATIENT TYPE:  INP   LOCATION:  5114                         FACILITY:  [facility]   PHYSICIAN:  Felipa Evener, MD  DATE OF BIRTH:  [birth year]   DATE OF CONSULTATION:  DATE OF DISCHARGE:                                 CONSULTATION   IDENTIFICATION:  The patient is a 50 year old [place] male with no  significant past medical history __________   DICTATION ENDED      Felipa Evener, MD     WJY/MEDQ  D:  01/14/2008  T:  01/14/2008  Job:  [id number]

## 2011-07-25 LAB — DIFFERENTIAL
Basophils Relative: 1
Eosinophils Absolute: 0.3
Neutro Abs: 3.8
Neutrophils Relative %: 51

## 2011-07-25 LAB — T-HELPER CELLS (CD4) COUNT (NOT AT ARMC)
CD4 % Helper T Cell: 26 — ABNORMAL LOW
CD4 T Cell Abs: 510

## 2011-07-25 LAB — EXPECTORATED SPUTUM ASSESSMENT W GRAM STAIN, RFLX TO RESP C

## 2011-07-25 LAB — BODY FLUID CELL COUNT WITH DIFFERENTIAL
Eos, Fluid: 4
Lymphs, Fluid: 11
Lymphs, Fluid: 12
Monocyte-Macrophage-Serous Fluid: 73
Neutrophil Count, Fluid: 15
Other Cells, Fluid: 0
Total Nucleated Cell Count, Fluid: 238

## 2011-07-25 LAB — I-STAT 8, (EC8 V) (CONVERTED LAB)
Acid-base deficit: 1
Bicarbonate: 27.7 — ABNORMAL HIGH
HCT: 51
Hemoglobin: 17.3 — ABNORMAL HIGH
Operator id: 234501
Potassium: 4.4
Sodium: 140
TCO2: 29

## 2011-07-25 LAB — CBC
HCT: 37.8 — ABNORMAL LOW
Hemoglobin: 12.5 — ABNORMAL LOW
MCHC: 32.5
MCHC: 33.2
Platelets: 231
Platelets: 251
RBC: 4.96
RBC: 5.31
RDW: 14.1
RDW: 14.3
WBC: 7.4

## 2011-07-25 LAB — BASIC METABOLIC PANEL
Calcium: 8.4
GFR calc non Af Amer: >60
Glucose, Bld: 105 — ABNORMAL HIGH
Sodium: 140

## 2011-07-25 LAB — AFB CULTURE WITH SMEAR (NOT AT ARMC)
Acid Fast Smear: NONE SEEN
Acid Fast Smear: NONE SEEN

## 2011-07-25 LAB — CULTURE, RESPIRATORY W GRAM STAIN
Culture: NORMAL
Culture: NO GROWTH

## 2011-07-25 LAB — CARDIAC PANEL(CRET KIN+CKTOT+MB+TROPI)
CK, MB: 1.4
Relative Index: INVALID
Total CK: 73
Total CK: 91

## 2011-07-25 LAB — TSH: TSH: 2.178

## 2011-07-25 LAB — COMPREHENSIVE METABOLIC PANEL
Albumin: 3.5
BUN: 12
Creatinine, Ser: 1.2
Total Protein: 7.2

## 2011-07-25 LAB — FUNGUS CULTURE W SMEAR: Fungal Smear: NONE SEEN

## 2011-07-25 LAB — HIV-1 RNA QUANT-NO REFLEX-BLD
HIV 1 RNA Quant: 52400 copies/mL — ABNORMAL HIGH (ref <50)
HIV-1 RNA Quant, Log: 4.72 — ABNORMAL HIGH (ref <1.70)

## 2011-07-25 LAB — HEPATITIS PANEL, ACUTE
HCV Ab: NEGATIVE
Hepatitis B Surface Ag: NEGATIVE

## 2011-07-25 LAB — LIPID PANEL
HDL: 26 — ABNORMAL LOW
Total CHOL/HDL Ratio: 6.1
VLDL: 33

## 2011-07-25 LAB — HIV ANTIBODY (ROUTINE TESTING W REFLEX): HIV: REACTIVE — AB

## 2011-07-25 LAB — APTT: aPTT: 29

## 2011-07-25 LAB — PROTIME-INR: Prothrombin Time: 13.2

## 2011-07-25 LAB — HIV-1 GENOTYPR PLUS

## 2011-07-26 LAB — T-HELPER CELL (CD4) - (RCID CLINIC ONLY)
CD4 % Helper T Cell: 20 — ABNORMAL LOW
CD4 T Cell Abs: 600

## 2011-08-02 LAB — T-HELPER CELL (CD4) - (RCID CLINIC ONLY): CD4 T Cell Abs: 430

## 2012-05-21 ENCOUNTER — Emergency Department (HOSPITAL_COMMUNITY)
Admission: EM | Admit: 2012-05-21 | Discharge: 2012-05-21 | Disposition: A | Discharge status: Home / Self Care | Payer: Medicaid Other | Attending: Emergency Medicine | Admitting: Emergency Medicine

## 2012-05-21 ENCOUNTER — Emergency Department (HOSPITAL_COMMUNITY): Payer: Medicaid Other

## 2012-05-21 DIAGNOSIS — R1032 Left lower quadrant pain: Secondary | ICD-10-CM | POA: Insufficient documentation

## 2012-05-21 DIAGNOSIS — R197 Diarrhea, unspecified: Secondary | ICD-10-CM | POA: Insufficient documentation

## 2012-05-21 DIAGNOSIS — R109 Unspecified abdominal pain: Secondary | ICD-10-CM

## 2012-05-21 DIAGNOSIS — Z21 Asymptomatic human immunodeficiency virus [HIV] infection status: Secondary | ICD-10-CM | POA: Insufficient documentation

## 2012-05-21 LAB — URINALYSIS, ROUTINE W REFLEX MICROSCOPIC
Glucose, UA: NEGATIVE mg/dL
Leukocytes, UA: NEGATIVE
Nitrite: NEGATIVE
pH: 6 (ref 5.0–8.0)

## 2012-05-21 LAB — URINE MICROSCOPIC-ADD ON

## 2012-05-21 LAB — CBC WITH DIFFERENTIAL/PLATELET
Basophils Absolute: 0.1 10*3/uL (ref 0.0–0.1)
Lymphocytes Relative: 37 % (ref 12–46)
Lymphs Abs: 1.9 10*3/uL (ref 0.7–4.0)
MCV: 80.8 fL (ref 78.0–100.0)
Neutro Abs: 2.7 10*3/uL (ref 1.7–7.7)
Neutrophils Relative %: 51 % (ref 43–77)
Platelets: 174 10*3/uL (ref 150–400)
RBC: 4.9 MIL/uL (ref 4.22–5.81)
WBC: 5.2 10*3/uL (ref 4.0–10.5)

## 2012-05-21 LAB — COMPREHENSIVE METABOLIC PANEL
Albumin: 3.5 g/dL (ref 3.5–5.2)
BUN: 16 mg/dL (ref 6–23)
Chloride: 102 mEq/L (ref 96–112)
Creatinine, Ser: 0.95 mg/dL (ref 0.50–1.35)
GFR calc Af Amer: >90 mL/min (ref >90)
GFR calc non Af Amer: >90 mL/min (ref >90)
Glucose, Bld: 76 mg/dL (ref 70–99)
Total Bilirubin: 0.2 mg/dL — ABNORMAL LOW (ref 0.3–1.2)

## 2012-05-21 MED ORDER — SODIUM CHLORIDE 0.9 % IV BOLUS (SEPSIS)
1000.0000 mL | Freq: Once | INTRAVENOUS | Status: AC
  Administered 2012-05-21: 1000 mL via INTRAVENOUS

## 2012-05-21 MED ORDER — MORPHINE SULFATE 4 MG/ML IJ SOLN
4.0000 mg | Freq: Once | INTRAMUSCULAR | Status: AC
  Administered 2012-05-21: 4 mg via INTRAVENOUS
  Filled 2012-05-21: qty 1

## 2012-05-21 MED ORDER — DICYCLOMINE HCL 20 MG PO TABS: 20.0000 mg | ORAL_TABLET | Freq: Two times a day (BID) | ORAL | Status: DC

## 2012-05-21 MED ORDER — IOHEXOL 300 MG/ML  SOLN
80.0000 mL | Freq: Once | INTRAMUSCULAR | Status: AC | PRN
  Administered 2012-05-21: 80 mL via INTRAVENOUS

## 2012-05-21 MED ORDER — IOHEXOL 300 MG/ML  SOLN
20.0000 mL | INTRAMUSCULAR | Status: AC
  Administered 2012-05-21 (×2): 20 mL via ORAL

## 2012-05-21 NOTE — ED Notes (Signed)
Daughter/translator stated, he has had stomach pain for 3 months.

## 2012-05-21 NOTE — ED Provider Notes (Signed)
History     CSN: [id number]  Arrival date & time 05/21/12  1059   First MD Initiated Contact with Patient 05/21/12 1252      Chief Complaint  Patient presents with  . Abdominal Pain    (Consider location/radiation/quality/duration/timing/severity/associated sxs/prior treatment) HPI Comments: Family member translated for patient.  States patient has had abdominal pain x 3 months, constant, described as burning cramping and sharp, worse with eating.  Pt was seen in [place] this spring for the same problem, has report with him in [place], was on medications at the time that he ran out of last week.  States these were helping.  Family member believes his diagnosis may have been ulcers - he was not supposed to drink coffee, soda, or eat spicy or sour foods.  Has occasional nausea, and frequent blood in his stool.  Denies fever, vomiting, urinary symptoms.  No hx abdominal surgeries.    Per chart, patient has history of HIV, last CD4 count was in April 2011, at that time was 340.    The history is provided by the patient and medical records. The history is limited by a language barrier. A language interpreter was used.    No past medical history on file.  No past surgical history on file.  No family history on file.  History  Substance Use Topics  . Smoking status: Not on file  . Smokeless tobacco: Not on file  . Alcohol Use: Not on file      Review of Systems  Constitutional: Negative for fever and chills.  Respiratory: Negative for cough and shortness of breath.   Cardiovascular: Negative for chest pain.  Gastrointestinal: Positive for abdominal pain, diarrhea and blood in stool. Negative for nausea, vomiting and constipation.  Genitourinary: Negative for dysuria, urgency and frequency.    Allergies  Review of patient's allergies indicates no known allergies.  Home Medications   Current Outpatient Rx  Name Route Sig Dispense Refill  . DARUNAVIR ETHANOLATE 400 MG PO  TABS Oral Take 400 mg by mouth. Take 2 tabs by mouth once daily     . EMTRICITABINE-TENOFOVIR 200-300 MG PO TABS Oral Take 1 tablet by mouth daily.      Marland Kitchen OMEPRAZOLE 40 MG PO CPDR Oral Take 40 mg by mouth daily.      Marland Kitchen PRAZIQUANTEL 600 MG PO TABS Oral Take 600 mg by mouth. Take 2 tabs by mouth once     . RITONAVIR 100 MG PO CAPS Oral Take 100 mg by mouth daily.        BP 94/71  Pulse 85  Temp 99.2 F (37.3 C) (Oral)  Resp 20  SpO2 97%  Physical Exam  Nursing note and vitals reviewed. Constitutional: He is oriented to person, place, and time. He appears well-developed and well-nourished. No distress.  HENT:  Head: Normocephalic and atraumatic.  Neck: Neck supple.  Cardiovascular: Normal rate, regular rhythm and normal heart sounds.   Pulmonary/Chest: Breath sounds normal. No respiratory distress. He has no wheezes. He has no rales. He exhibits no tenderness.  Abdominal: Soft. Bowel sounds are normal. He exhibits no distension and no mass. There is tenderness in the left lower quadrant. There is no rigidity, no rebound and no guarding.  Neurological: He is alert and oriented to person, place, and time.  Skin: He is not diaphoretic.    ED Course  Procedures (including critical care time)  Labs Reviewed  COMPREHENSIVE METABOLIC PANEL - Abnormal; Notable for the following:  Potassium 5.7 (*)     Total Protein 8.5 (*)     AST 38 (*)  HEMOLYSIS AT THIS LEVEL MAY AFFECT RESULT   Total Bilirubin 0.2 (*)     All other components within normal limits  URINALYSIS, ROUTINE W REFLEX MICROSCOPIC - Abnormal; Notable for the following:    Color, Urine AMBER (*)  BIOCHEMICALS MAY BE AFFECTED BY COLOR   Bilirubin Urine SMALL (*)     Protein, ur 30 (*)     All other components within normal limits  CBC WITH DIFFERENTIAL  URINE MICROSCOPIC-ADD ON   No results found.  1:24 PM Patient not in room.  3:54 PM Patient reports he is currently comfortable, no needs at this time, drinking  contrast for CT scan.  Concern for possible diverticulitis, colitis. Discussed patient with Dr Juleen China who assumes care of patient at change of shift.     No diagnosis found.    MDM  Patient with hx HIV with 3 months of lower abdominal pain, occasional blood diarrhea.  Pt with hx HIV, unclear whether he is still taking medications or not.  Last CD4 was in 2011.  TTP LLQ.  Pt is afebrile, nontoxic, tolerating PO.  Pt signed out to Dr Juleen China at change of shift pending CT abd/pelvis.          Dillard Cannon [place], [place] 05/21/12 [phone]

## 2012-05-21 NOTE — ED Notes (Signed)
Family at bedside. 

## 2012-05-27 NOTE — ED Provider Notes (Signed)
Medical screening examination/treatment/procedure(s) were performed by non-physician practitioner and as supervising physician I was immediately available for consultation/collaboration.   Gwyneth Sprout, MD 05/27/12 2134

## 2012-06-12 ENCOUNTER — Emergency Department (HOSPITAL_COMMUNITY): Payer: Medicaid Other

## 2012-06-12 ENCOUNTER — Emergency Department (HOSPITAL_COMMUNITY)
Admission: EM | Admit: 2012-06-12 | Discharge: 2012-06-12 | Disposition: A | Discharge status: Home / Self Care | Payer: Medicaid Other | Attending: Emergency Medicine | Admitting: Emergency Medicine

## 2012-06-12 ENCOUNTER — Encounter (HOSPITAL_COMMUNITY): Payer: Self-pay | Admitting: *Deleted

## 2012-06-12 DIAGNOSIS — Z21 Asymptomatic human immunodeficiency virus [HIV] infection status: Secondary | ICD-10-CM | POA: Insufficient documentation

## 2012-06-12 DIAGNOSIS — F17200 Nicotine dependence, unspecified, uncomplicated: Secondary | ICD-10-CM | POA: Insufficient documentation

## 2012-06-12 DIAGNOSIS — R51 Headache: Secondary | ICD-10-CM | POA: Insufficient documentation

## 2012-06-12 DIAGNOSIS — Z760 Encounter for issue of repeat prescription: Secondary | ICD-10-CM | POA: Insufficient documentation

## 2012-06-12 DIAGNOSIS — R109 Unspecified abdominal pain: Secondary | ICD-10-CM | POA: Insufficient documentation

## 2012-06-12 HISTORY — DX: Asymptomatic human immunodeficiency virus (hiv) infection status: Z21

## 2012-06-12 HISTORY — DX: Human immunodeficiency virus (HIV) disease: B20

## 2012-06-12 HISTORY — DX: Tuberculosis of lung: A15.0

## 2012-06-12 LAB — POCT I-STAT, CHEM 8
Calcium, Ion: 1.19 mmol/L (ref 1.12–1.23)
Chloride: 107 mEq/L (ref 96–112)
Creatinine, Ser: 1.1 mg/dL (ref 0.50–1.35)
Glucose, Bld: 105 mg/dL — ABNORMAL HIGH (ref 70–99)
HCT: 41 % (ref 39.0–52.0)
Hemoglobin: 13.9 g/dL (ref 13.0–17.0)

## 2012-06-12 LAB — CBC WITH DIFFERENTIAL/PLATELET
Basophils Absolute: 0 10*3/uL (ref 0.0–0.1)
Basophils Relative: 1 % (ref 0–1)
Eosinophils Absolute: 0.1 10*3/uL (ref 0.0–0.7)
HCT: 36.6 % — ABNORMAL LOW (ref 39.0–52.0)
Hemoglobin: 12.4 g/dL — ABNORMAL LOW (ref 13.0–17.0)
MCH: 26.9 pg (ref 26.0–34.0)
MCHC: 33.9 g/dL (ref 30.0–36.0)
Monocytes Absolute: 0.7 10*3/uL (ref 0.1–1.0)
Monocytes Relative: 14 % — ABNORMAL HIGH (ref 3–12)
Neutro Abs: 2.4 10*3/uL (ref 1.7–7.7)
RDW: 14.7 % (ref 11.5–15.5)

## 2012-06-12 MED ORDER — DICYCLOMINE HCL 20 MG PO TABS: 20.0000 mg | ORAL_TABLET | Freq: Two times a day (BID) | ORAL | Status: DC

## 2012-06-12 NOTE — ED Provider Notes (Signed)
History     CSN: [id number]  Arrival date & time 06/12/12  0910   First MD Initiated Contact with Patient 06/12/12 1244      Chief Complaint  Patient presents with  . Medication Refill  . Headache    (Consider location/radiation/quality/duration/timing/severity/associated sxs/prior treatment) HPI Comments: Family interpreting. Patient with h/o HIV, TB, no infectious disease visits since 2011 per records and family -- presents with c/o abdominal pain, HA. Patient was seen in 04/2012 for abdominal pain, had neg CT and was d/c home with bentyl. Bentyl improved symptoms, but patient is now out and is requesting refill. HA is all over head, dull. It is intermittent but everyday for 2 months. No fever, vomiting, neck pain. No confusion. It is associated with bilateral blurry vision, generalized weakness. No focal weakness in arms or legs. Onset of headache gradual. Course intermittent. Pain does not radiate. Tylenol helps temporarily. Nothing makes it worse.   Patient is a 51 y.o. male presenting with headaches. The history is provided by the patient and a relative.  Headache  This is a new problem. The current episode started more than 1 week ago. The problem occurs constantly. The problem has not changed since onset.The headache is associated with nothing. The pain is located in the frontal region. The quality of the pain is described as dull. The patient is experiencing no pain. The pain does not radiate. Pertinent negatives include no fever, no nausea and no vomiting. He has tried acetaminophen for the symptoms. The treatment provided mild relief.    Past Medical History  Diagnosis Date  . HIV (human immunodeficiency virus infection)   . TB (pulmonary tuberculosis)     History reviewed. No pertinent past surgical history.  No family history on file.  History  Substance Use Topics  . Smoking status: Current Everyday Smoker  . Smokeless tobacco: Not on file  . Alcohol Use: Yes   former      Review of Systems  Constitutional: Positive for fatigue. Negative for fever.  HENT: Negative for congestion, sore throat, rhinorrhea, neck pain, neck stiffness and dental problem.   Eyes: Positive for visual disturbance. Negative for photophobia.  Respiratory: Negative for cough.   Cardiovascular: Negative for chest pain.  Gastrointestinal: Positive for abdominal pain. Negative for nausea, vomiting and diarrhea.  Musculoskeletal: Negative for myalgias.  Skin: Negative for rash.  Neurological: Positive for headaches. Negative for facial asymmetry, speech difficulty, weakness and light-headedness.  Psychiatric/Behavioral: Negative for confusion.    Allergies  Review of patient's allergies indicates no known allergies.  Home Medications   Current Outpatient Rx  Name Route Sig Dispense Refill  . ACETAMINOPHEN 325 MG PO TABS Oral Take 325 mg by mouth every 6 (six) hours as needed. For headache    . OMEPRAZOLE 40 MG PO CPDR Oral Take 40 mg by mouth daily.      Marland Kitchen DICYCLOMINE HCL 20 MG PO TABS Oral Take 1 tablet (20 mg total) by mouth 2 (two) times daily. 30 tablet 0    BP 108/72  Pulse 56  Temp 98.2 F (36.8 C) (Oral)  Resp 16  SpO2 100%  Physical Exam  Nursing note and vitals reviewed. Constitutional: He is oriented to person, place, and time. He appears well-developed. He appears cachectic. No distress.  HENT:  Head: Normocephalic and atraumatic.  Eyes: Conjunctivae are normal. Pupils are equal, round, and reactive to light. Right eye exhibits no discharge. Left eye exhibits no discharge.  Neck: Normal range of motion. Neck  supple.       No meningeal signs  Cardiovascular: Normal rate, regular rhythm and normal heart sounds.   No murmur heard. Pulmonary/Chest: Effort normal and breath sounds normal.  Abdominal: Soft. Bowel sounds are normal. There is no tenderness. There is no rebound and no guarding.  Musculoskeletal: He exhibits no edema and no tenderness.    Neurological: He is alert and oriented to person, place, and time. He has normal strength. No cranial nerve deficit or sensory deficit. Coordination normal. GCS eye subscore is 4. GCS verbal subscore is 5. GCS motor subscore is 6.  Skin: Skin is warm and dry. He is not diaphoretic.  Psychiatric: He has a normal mood and affect.    ED Course  Procedures (including critical care time)  Labs Reviewed  POCT I-STAT, CHEM 8 - Abnormal; Notable for the following:    Glucose, Bld 105 (*)     All other components within normal limits  CBC WITH DIFFERENTIAL - Abnormal; Notable for the following:    Hemoglobin 12.4 (*)     HCT 36.6 (*)     Monocytes Relative 14 (*)     All other components within normal limits   Ct Head Wo Contrast  06/12/2012  *RADIOLOGY REPORT*  Clinical Data: Headache for 2 months.  History of HIV  CT HEAD WITHOUT CONTRAST  Technique:  Contiguous axial images were obtained from the base of the skull through the vertex without contrast.  Comparison: Brain MRI, 08/14/2008  Findings: Ventricles are normal in size and configuration.  There are no parenchymal masses or mass effect.  There are no areas of abnormal parenchymal attenuation.  No extra-axial masses or abnormal fluid collections.  No evidence of a recent infarct or intracranial hemorrhage.  The visualized sinuses and mastoid air cells are clear.  IMPRESSION: Normal unenhanced CT scan of the brain.  Original Report Authenticated By: Domenic Moras, M.D.     1. Headache   2. Abdominal pain     1:12 PM Patient seen and examined. Previous records reviewed. Patient discussed with Dr. Rhunette Croft. CT ordered. CBC ordered.    Vital signs reviewed and are as follows: Filed Vitals:   06/12/12 1210  BP: 108/72  Pulse: 56  Temp:   Resp:   BP 108/72  Pulse 56  Temp 98.2 F (36.8 C) (Oral)  Resp 16  SpO2 100%  3:41 PM Findings reviewed with Dr. Rhunette Croft and patient/family.   Will refill bentyl.   Urged follow-up as  soon as possible with Dr. [name]. Referral info given.   Urged follow-up with ophthalmologist to evaluate vision change. Referral given.   The patient was urged to return to the Emergency Department immediately with worsening of current symptoms, worsening abdominal pain, persistent vomiting, blood noted in stools, fever, or any other concerns. The patient/family verbalized understanding.     MDM  Patient with HIV, ?AIDS, apparently lost to ID follow-up x 2 years.   Refill for bentyl: benign exam and labs reassuring. Possibly related to ? H/o GERD/PUD. No concerns for acute or infectious process today.  HA: Not patient's primary complaint but evaluated with CT due to h/o immunocompromise. CT neg. Normal neurological exam here. No thunderclap or concern for meningitis. Symptoms for 2 months. This can be followed by PCP. LP not indicated today.         Renne Crigler, [place] 06/12/12 1545

## 2012-06-12 NOTE — ED Notes (Signed)
PT has been having general headache for 2 months and reports blurry vision for 2 months.  Medication refill for Bentyl.

## 2012-06-12 NOTE — ED Notes (Signed)
Patient returned from CT

## 2012-06-12 NOTE — ED Notes (Signed)
Patient transported to CT 

## 2012-06-13 ENCOUNTER — Other Ambulatory Visit: Payer: Self-pay | Admitting: Infectious Diseases

## 2012-06-13 DIAGNOSIS — Z113 Encounter for screening for infections with a predominantly sexual mode of transmission: Secondary | ICD-10-CM

## 2012-06-13 DIAGNOSIS — B20 Human immunodeficiency virus [HIV] disease: Secondary | ICD-10-CM

## 2012-06-13 DIAGNOSIS — Z79899 Other long term (current) drug therapy: Secondary | ICD-10-CM

## 2012-06-14 NOTE — ED Provider Notes (Signed)
Medical screening examination/treatment/procedure(s) were conducted as a shared visit with non-physician practitioner(s) and myself.  I personally evaluated the patient during the encounter  Derwood Kaplan, MD 06/14/12 1709

## 2012-06-20 ENCOUNTER — Other Ambulatory Visit (HOSPITAL_COMMUNITY)
Admission: RE | Admit: 2012-06-20 | Discharge: 2012-06-20 | Disposition: A | Discharge status: Home / Self Care | Payer: Medicaid Other | Source: Ambulatory Visit | Attending: Infectious Diseases | Admitting: Infectious Diseases

## 2012-06-20 ENCOUNTER — Other Ambulatory Visit: Payer: Self-pay | Admitting: Infectious Diseases

## 2012-06-20 ENCOUNTER — Other Ambulatory Visit: Payer: Medicaid Other

## 2012-06-20 DIAGNOSIS — Z113 Encounter for screening for infections with a predominantly sexual mode of transmission: Secondary | ICD-10-CM | POA: Insufficient documentation

## 2012-06-20 DIAGNOSIS — B20 Human immunodeficiency virus [HIV] disease: Secondary | ICD-10-CM

## 2012-06-20 DIAGNOSIS — Z79899 Other long term (current) drug therapy: Secondary | ICD-10-CM

## 2012-06-21 LAB — T-HELPER CELL (CD4) - (RCID CLINIC ONLY)
CD4 % Helper T Cell: 14 % — ABNORMAL LOW (ref 33–55)
CD4 T Cell Abs: 250 uL — ABNORMAL LOW (ref 400–2700)

## 2012-06-21 LAB — RPR

## 2012-06-21 LAB — LIPID PANEL
Cholesterol: 192 mg/dL (ref 0–200)
VLDL: 54 mg/dL — ABNORMAL HIGH (ref 0–40)

## 2012-06-21 LAB — HIV-1 RNA ULTRAQUANT REFLEX TO GENTYP+: HIV-1 RNA Quant, Log: 4.76 {Log} — ABNORMAL HIGH (ref <1.30)

## 2012-06-27 LAB — HIV-1 GENOTYPR PLUS

## 2012-07-04 ENCOUNTER — Ambulatory Visit: Payer: Medicaid Other | Admitting: Infectious Diseases

## 2012-08-01 ENCOUNTER — Ambulatory Visit (INDEPENDENT_AMBULATORY_CARE_PROVIDER_SITE_OTHER): Payer: Medicaid Other | Admitting: Infectious Diseases

## 2012-08-01 ENCOUNTER — Encounter: Payer: Self-pay | Admitting: Infectious Diseases

## 2012-08-01 VITALS — BP 102/70 | HR 82 | Temp 98.2°F | Wt 104.0 lb

## 2012-08-01 DIAGNOSIS — R51 Headache: Secondary | ICD-10-CM

## 2012-08-01 DIAGNOSIS — R109 Unspecified abdominal pain: Secondary | ICD-10-CM | POA: Insufficient documentation

## 2012-08-01 DIAGNOSIS — Z23 Encounter for immunization: Secondary | ICD-10-CM

## 2012-08-01 DIAGNOSIS — B20 Human immunodeficiency virus [HIV] disease: Secondary | ICD-10-CM

## 2012-08-01 MED ORDER — ELVITEG-COBIC-EMTRICIT-TENOFDF 150-150-200-300 MG PO TABS: 1.0000 | ORAL_TABLET | Freq: Every day | ORAL | Status: DC

## 2012-08-01 NOTE — Progress Notes (Signed)
  Subjective:    Patient ID: Eric Jefferson, male    DOB: [birth year], 51 y.o.   MRN: [id number]  HPI 51 y.o. M montangard who was adm to [facility] on 01-08-08 with a 10 day history of cough, yellow sputum and wt loss. He had a CXR and CT scan which showed a cavitrary LUL mass. He had 3 afbs negative as well as a bronchoscopy (01-15-08) which grew a pansensitive M tuberculosis. As part of his workup he had a HIV elisa that was + and VL 52,400 and CD4 510. Genotype y181c, l210w, t215d, m46l.  Completed his TB therapy 11-09.  Previously started on DRVr/TRV. Noted to have tenia eggs in stool previously. Today complains of headache and back pain. Has tried no OTCs. Was seen in ED for this, given tylenol. CT head: neagtive.  No diarrhea, has had some abd pain. Was seen in Ed (July 2013) for this and given  CT abd: 1. No acute findings identified within the abdomen or pelvis.  2. Borderline enlarged retrocrural lymph node measuring 1.1 cm. Similar to 01/08/2008 He appears to have been given bentyl for this.    Review of Systems     Objective:   Physical Exam  Constitutional: He appears cachectic.  HENT:  Mouth/Throat: No oropharyngeal exudate.  Eyes: EOM are normal. Pupils are equal, round, and reactive to light.  Neck: Neck supple.  Cardiovascular: Normal rate, regular rhythm and normal heart sounds.   Pulmonary/Chest: Effort normal and breath sounds normal.  Abdominal: Soft. Bowel sounds are normal. He exhibits no distension. There is tenderness. There is no rebound.       Diffuse tenderness, voluntary guarding.   Lymphadenopathy:    He has no cervical adenopathy.          Assessment & Plan:

## 2012-08-01 NOTE — Assessment & Plan Note (Signed)
Continue prn OTC

## 2012-08-01 NOTE — Assessment & Plan Note (Signed)
Not sure he can get complera with Y181C. Will start him on stribilid. See him back in 6 weeks. He is offered/refused condoms. He gets flu shot today.

## 2012-08-01 NOTE — Assessment & Plan Note (Signed)
Etiology of this is unclear. i am not sure if he has IBS (he responded to Bentyl). There is a vague hx of ulcers previously. Will have him seen by GI.

## 2012-08-07 ENCOUNTER — Other Ambulatory Visit: Payer: Self-pay | Admitting: *Deleted

## 2012-08-07 DIAGNOSIS — B20 Human immunodeficiency virus [HIV] disease: Secondary | ICD-10-CM

## 2012-08-07 MED ORDER — DICYCLOMINE HCL 20 MG PO TABS: 20.0000 mg | ORAL_TABLET | Freq: Two times a day (BID) | ORAL | Status: DC

## 2012-08-07 MED ORDER — ELVITEG-COBIC-EMTRICIT-TENOFDF 150-150-200-300 MG PO TABS: 1.0000 | ORAL_TABLET | Freq: Every day | ORAL | Status: DC

## 2012-11-01 ENCOUNTER — Other Ambulatory Visit: Payer: Medicaid Other

## 2012-11-14 ENCOUNTER — Ambulatory Visit: Payer: Medicaid Other | Admitting: Infectious Diseases

## 2012-11-14 ENCOUNTER — Other Ambulatory Visit (INDEPENDENT_AMBULATORY_CARE_PROVIDER_SITE_OTHER): Payer: Medicaid Other

## 2012-11-14 DIAGNOSIS — B20 Human immunodeficiency virus [HIV] disease: Secondary | ICD-10-CM

## 2012-11-14 LAB — COMPREHENSIVE METABOLIC PANEL
CO2: 24 mEq/L (ref 19–32)
Glucose, Bld: 73 mg/dL (ref 70–99)
Sodium: 137 mEq/L (ref 135–145)
Total Bilirubin: 0.3 mg/dL (ref 0.3–1.2)
Total Protein: 8.7 g/dL — ABNORMAL HIGH (ref 6.0–8.3)

## 2012-11-15 LAB — CBC WITH DIFFERENTIAL/PLATELET
Lymphocytes Relative: 31 % (ref 12–46)
Lymphs Abs: 1.4 10*3/uL (ref 0.7–4.0)
MCV: 75.9 fL — ABNORMAL LOW (ref 78.0–100.0)
Neutrophils Relative %: 55 % (ref 43–77)
Platelets: 324 10*3/uL (ref 150–400)
RBC: 4.56 MIL/uL (ref 4.22–5.81)
WBC: 4.5 10*3/uL (ref 4.0–10.5)

## 2012-11-15 LAB — T-HELPER CELL (CD4) - (RCID CLINIC ONLY): CD4 T Cell Abs: 230 uL — ABNORMAL LOW (ref 400–2700)

## 2012-12-05 ENCOUNTER — Telehealth: Payer: Self-pay | Admitting: *Deleted

## 2012-12-05 ENCOUNTER — Ambulatory Visit (INDEPENDENT_AMBULATORY_CARE_PROVIDER_SITE_OTHER): Payer: Medicaid Other | Admitting: Infectious Diseases

## 2012-12-05 ENCOUNTER — Ambulatory Visit
Admission: RE | Admit: 2012-12-05 | Discharge: 2012-12-05 | Disposition: A | Discharge status: Home / Self Care | Payer: Medicaid Other | Source: Ambulatory Visit | Attending: Infectious Diseases | Admitting: Infectious Diseases

## 2012-12-05 ENCOUNTER — Encounter: Payer: Self-pay | Admitting: Infectious Diseases

## 2012-12-05 VITALS — BP 108/72 | HR 96 | Temp 97.8°F | Ht 62.0 in | Wt 102.0 lb

## 2012-12-05 DIAGNOSIS — A150 Tuberculosis of lung: Secondary | ICD-10-CM

## 2012-12-05 DIAGNOSIS — B20 Human immunodeficiency virus [HIV] disease: Secondary | ICD-10-CM

## 2012-12-05 DIAGNOSIS — R05 Cough: Secondary | ICD-10-CM | POA: Insufficient documentation

## 2012-12-05 MED ORDER — ELVITEG-COBIC-EMTRICIT-TENOFDF 150-150-200-300 MG PO TABS: 1.0000 | ORAL_TABLET | Freq: Every day | ORAL | Status: DC

## 2012-12-05 MED ORDER — AZITHROMYCIN 250 MG PO TABS: ORAL_TABLET | ORAL | Status: DC

## 2012-12-05 NOTE — Assessment & Plan Note (Signed)
Will check CXR, sputum Cx, start anbx.

## 2012-12-05 NOTE — Assessment & Plan Note (Addendum)
His adherence is unclear. Will try to have bridge/adherence counselor go see him. Encouraged him to take his ART, will continue his stribilid. Will see him back in 3 months.

## 2012-12-05 NOTE — Telephone Encounter (Signed)
Called patient via interpreter line to inform patient of appointment with Dr. Sherene Sires at [facility] for Friday 12/07/12 at 3:00 pm. Patient was unsure of directions and is going to have his own interpreter, a family member, call back to get directions. Eric Jefferson

## 2012-12-05 NOTE — Progress Notes (Signed)
  Subjective:    Patient ID: Eric Jefferson, male    DOB: Nov 22, 1960, 52 y.o.   MRN: [id number]  HPI 52 yo M Montangard who was adm to [facility] on 01-08-08 with a 10 day history of cough, yellow sputum and wt loss. He had a CXR and CT scan which showed a cavitrary LUL mass. He had 3 afbs negative as well as a bronchoscopy (01-15-08) which grew a pansensitive M tuberculosis. As part of his workup he had a HIV elisa that was + and VL 52,400 and CD4 510. Genotype y181c, l210w, t215d, m46l. (repeat 05-2012 showed same).  Completed his TB therapy 11-09.  Started on DRVr/TRV. Switched to stribilid for 1 month then switched back.  Today feels poorly, has been coughing for 3 weeks. Non-productive. No f/c. No LAN since last month.   HIV 1 RNA Quant (copies/mL)  Date Value  11/14/2012 56998*  06/20/2012 14782*  02/17/2010 51800*     CD4 T Cell Abs (cmm)  Date Value  11/14/2012 230*  06/20/2012 250*  02/17/2010 340*     Review of Systems     Objective:   Physical Exam  Constitutional: He appears well-developed and well-nourished.  HENT:  Mouth/Throat: No oropharyngeal exudate.  Eyes: EOM are normal. Pupils are equal, round, and reactive to light.  Neck: Neck supple.  Cardiovascular: Normal rate, regular rhythm and normal heart sounds.   Pulmonary/Chest: Effort normal and breath sounds normal. No respiratory distress.  Abdominal: Soft. Bowel sounds are normal. There is no tenderness. There is no rebound.  Lymphadenopathy:    He has no cervical adenopathy.          Assessment & Plan:

## 2012-12-06 ENCOUNTER — Other Ambulatory Visit: Payer: Self-pay | Admitting: Infectious Diseases

## 2012-12-06 DIAGNOSIS — A150 Tuberculosis of lung: Secondary | ICD-10-CM

## 2012-12-06 NOTE — Addendum Note (Signed)
Addended by: Mariea Clonts D on: 12/06/2012 04:32 PM   Modules accepted: Orders

## 2012-12-07 ENCOUNTER — Encounter: Payer: Self-pay | Admitting: Internal Medicine

## 2012-12-07 ENCOUNTER — Ambulatory Visit (INDEPENDENT_AMBULATORY_CARE_PROVIDER_SITE_OTHER): Payer: Medicaid Other | Admitting: Internal Medicine

## 2012-12-07 VITALS — BP 100/64 | HR 94 | Temp 98.4°F | Ht 64.0 in | Wt 105.8 lb

## 2012-12-07 DIAGNOSIS — R918 Other nonspecific abnormal finding of lung field: Secondary | ICD-10-CM

## 2012-12-07 MED ORDER — FAMOTIDINE 20 MG PO TABS: ORAL_TABLET | ORAL | Status: DC

## 2012-12-07 MED ORDER — TRAMADOL HCL 50 MG PO TABS: ORAL_TABLET | ORAL | Status: DC

## 2012-12-07 MED ORDER — OMEPRAZOLE 20 MG PO CPDR: 20.0000 mg | DELAYED_RELEASE_CAPSULE | Freq: Every day | ORAL | Status: DC

## 2012-12-07 NOTE — Patient Instructions (Addendum)
Omeprazole 20 mg (called in)  before breakfast and pepcid 20 mg one at bedtime as long as coughing   Take delsym two tsp every 12 hours and supplement if needed with  tramadol 50 mg (called in ) up to 1 every 4 hours to suppress the urge to cough. Swallowing water or using ice chips/non mint and menthol containing candies (such as lifesavers or sugarless jolly ranchers) are also effective.  You should rest your voice and avoid activities that you know make you cough.  Once you have eliminated the cough for 3 straight days try reducing the tramadol first,  then the delsym as tolerated.    I will Dr [name] and discuss with workup which may include a repeat bronchoscopy by Dr Molli Knock.

## 2012-12-07 NOTE — Progress Notes (Signed)
  Subjective:    Patient ID: Eric Jefferson, male    DOB: 20-Jul-1961  MRN: [id number]  HPI  52 yo vietnamese male with HIV and prev M TB dx at fob by Molli Knock 01/15/2008  quit smoking in 10/2012 referred by [name] for eval of cough  12/07/2012 1st pulmonary eval cc abrupt onset cough x 1 month esp at hs and early am yellow mucus assoc with chest tightness and doe but not at rest.  Extremely difficult hx even through interpreter as to what meds if any he has or takes at this point but note extremely low CD4 count in ID clinic 12/05/12 and cxr c/w nodular lung dz > upper than lower lobes.  Initial sputum afb neg  No obvious daytime variabilty or assoc  subjective wheeze overt sinus or hb symptoms. No unusual exp hx or h/o childhood pna/ asthma or premature birth to his knowledge.     Also denies any obvious fluctuation of symptoms with weather or environmental changes or other aggravating or alleviating factors except as outlined above     Review of Systems  Constitutional: Negative for fever and unexpected weight change.  HENT: Negative for ear pain, nosebleeds, congestion, sore throat, rhinorrhea, sneezing, trouble swallowing, dental problem, postnasal drip and sinus pressure.   Eyes: Negative for redness and itching.  Respiratory: Positive for cough. Negative for chest tightness, shortness of breath and wheezing.   Cardiovascular: Positive for chest pain. Negative for palpitations and leg swelling.  Gastrointestinal: Positive for abdominal pain. Negative for nausea and vomiting.  Genitourinary: Negative for dysuria.  Musculoskeletal: Negative for joint swelling.  Skin: Negative for rash.  Neurological: Positive for headaches.  Hematological: Does not bruise/bleed easily.  Psychiatric/Behavioral: Negative for dysphoric mood. The patient is not nervous/anxious.        Objective:   Physical Exam  Thin asian male nad at rest Wt Readings from Last 3 Encounters:  12/07/12 105 lb 12.8 oz (47.991 kg)   12/05/12 102 lb (46.267 kg)  08/01/12 104 lb (47.174 kg)    HEENT: nl dentition, turbinates, and orophanx. Nl external ear canals without cough reflex   NECK :  without JVD/Nodes/TM/ nl carotid upstrokes bilaterally   LUNGS: no acc muscle use, clear to A and P bilaterally without cough on insp or exp maneuvers   CV:  RRR  no s3 or murmur or increase in P2, no edema   ABD:  soft and nontender with nl excursion in the supine position. No bruits or organomegaly, bowel sounds nl  MS:  warm without deformities, calf tenderness, cyanosis or clubbing  SKIN: warm and dry without lesions    NEURO:  alert, approp, no deficits     cxr 12/05/12 Extensive bilateral airspace nodules with an upper lobe  distribution. This is most compatible with an inflammatory  process. Disseminated TB infection is in the differential  diagnosis.     Assessment & Plan:

## 2012-12-08 DIAGNOSIS — B392 Pulmonary histoplasmosis capsulati, unspecified: Secondary | ICD-10-CM | POA: Insufficient documentation

## 2012-12-08 NOTE — Assessment & Plan Note (Addendum)
Given extremely low cd 4 count ddx is atypical TB vs fungal vs met ca but note absence of fever (at least as far as I could tell by hx) - since initial smears are neg, which is what happened previously with dx of M TB which is obviously easier to dx than atypical TB,  rec proceeding to outpt fob but will defer this to Dr Molli Knock / [name] to work out and only rec add gerd rx for cough (to prevent/ treat cyclical cough ) for now.  See instructions for specific recommendations which were reviewed directly with the patient (through interpreter) and was given a copy with highlighter outlining the key components.

## 2012-12-09 LAB — RESPIRATORY CULTURE OR RESPIRATORY AND SPUTUM CULTURE

## 2012-12-11 ENCOUNTER — Ambulatory Visit: Payer: Medicaid Other | Admitting: Pharmacist Clinician (PhC)/ Clinical Pharmacy Specialist

## 2012-12-11 ENCOUNTER — Telehealth: Payer: Self-pay | Admitting: Internal Medicine

## 2012-12-11 DIAGNOSIS — B20 Human immunodeficiency virus [HIV] disease: Secondary | ICD-10-CM

## 2012-12-11 NOTE — Telephone Encounter (Signed)
Will forward to MW since looks like he wanted to speak with Dr. [name] according to pt's AVS

## 2012-12-12 NOTE — Telephone Encounter (Signed)
Will forward to Dr Molli Knock for admit and fob if he agrees

## 2012-12-17 NOTE — Progress Notes (Signed)
HPI: Eric Jefferson is a 52 y.o. male with HIV who is here to discuss problem with his meds  Allergies: No Known Allergies  Vitals:    Past Medical History: Past Medical History  Diagnosis Date  . HIV (human immunodeficiency virus infection)   . TB (pulmonary tuberculosis)   . Diabetes     Social History: History   Social History  . Marital Status: Single    Spouse Name: N/A    Number of Children: 6  . Years of Education: N/A   Occupational History  .      [employer]   Social History Main Topics  . Smoking status: Former Smoker -- 1.00 packs/day for 30 years    Types: Cigarettes    Start date: 10/01/2012  . Smokeless tobacco: Current User  . Alcohol Use: No     Comment: former  . Drug Use: No  . Sexually Active: Not on file   Other Topics Concern  . Not on file   Social History Narrative  . No narrative on file    Home Medications:  (Not in a hospital admission)  Current Regimen: None  Labs: HIV 1 RNA Quant (copies/mL)  Date Value  11/14/2012 56998*  06/20/2012 40981*  02/17/2010 51800*     CD4 T Cell Abs (cmm)  Date Value  12/05/2012 230*  11/14/2012 230*  06/20/2012 250*     Hep B S Ab (no units)  Date Value  05/09/2008 Negative      Hepatitis B Surface Ag (no units)  Date Value  01/07/2009 NEG      HCV Ab (no units)  Date Value  05/09/2008 Negative     CrCl: The CrCl is unknown because both a height and weight (above a minimum accepted value) are required for this calculation.  Lipids:    Component Value Date/Time   CHOL 192 06/20/2012 1439   TRIG 270* 06/20/2012 1439   HDL 25* 06/20/2012 1439   CHOLHDL 7.7 06/20/2012 1439   VLDL 54* 06/20/2012 1439   LDLCALC 113* 06/20/2012 1439    Assessment: Pt with HIV who was previously treated for TB. Dr [name] referred him to med for meds education. He showed up with a [facility] interpreter but he does speak vietnamese so I was able to communicate without any issues. He still doesn't have his stribild  because he told me his medicaid doesn't cover it. I called the pharmacy to confirm and they also told me this. I call the local medicaid office and they told me that he is under the family care plan which doesn't cover any meds. I discussed the case with Britta Mccreedy to see if he can apply for ADAP. He is going to bring in his paperwork next week to get his ADAP application processed.   Recommendations: F/u next week for ADAP application  Clide Cliff, PharmD Clinical Infectious Disease Pharmacist [facility] 12/17/2012, 11:11 PM'

## 2012-12-18 ENCOUNTER — Ambulatory Visit: Payer: Medicaid Other

## 2012-12-19 ENCOUNTER — Other Ambulatory Visit: Payer: Self-pay | Admitting: *Deleted

## 2012-12-19 MED ORDER — ELVITEG-COBIC-EMTRICIT-TENOFDF 150-150-200-300 MG PO TABS: 1.0000 | ORAL_TABLET | Freq: Every day | ORAL | Status: DC

## 2012-12-23 ENCOUNTER — Inpatient Hospital Stay (HOSPITAL_COMMUNITY)
Admission: EM | Admit: 2012-12-23 | Discharge: 2012-12-29 | DRG: 974 | Disposition: A | Discharge status: Home / Self Care | Payer: Medicaid Other | Attending: Internal Medicine | Admitting: Internal Medicine

## 2012-12-23 DIAGNOSIS — B392 Pulmonary histoplasmosis capsulati, unspecified: Secondary | ICD-10-CM | POA: Diagnosis present

## 2012-12-23 DIAGNOSIS — E876 Hypokalemia: Secondary | ICD-10-CM | POA: Diagnosis not present

## 2012-12-23 DIAGNOSIS — Z87891 Personal history of nicotine dependence: Secondary | ICD-10-CM

## 2012-12-23 DIAGNOSIS — R918 Other nonspecific abnormal finding of lung field: Secondary | ICD-10-CM

## 2012-12-23 DIAGNOSIS — Z79899 Other long term (current) drug therapy: Secondary | ICD-10-CM

## 2012-12-23 DIAGNOSIS — J189 Pneumonia, unspecified organism: Secondary | ICD-10-CM | POA: Diagnosis present

## 2012-12-23 DIAGNOSIS — Z791 Long term (current) use of non-steroidal anti-inflammatories (NSAID): Secondary | ICD-10-CM

## 2012-12-23 DIAGNOSIS — A150 Tuberculosis of lung: Secondary | ICD-10-CM | POA: Diagnosis present

## 2012-12-23 DIAGNOSIS — E46 Unspecified protein-calorie malnutrition: Secondary | ICD-10-CM | POA: Diagnosis present

## 2012-12-23 DIAGNOSIS — B59 Pneumocystosis: Secondary | ICD-10-CM | POA: Diagnosis present

## 2012-12-23 DIAGNOSIS — I951 Orthostatic hypotension: Secondary | ICD-10-CM | POA: Diagnosis present

## 2012-12-23 DIAGNOSIS — K029 Dental caries, unspecified: Secondary | ICD-10-CM | POA: Diagnosis present

## 2012-12-23 DIAGNOSIS — J9600 Acute respiratory failure, unspecified whether with hypoxia or hypercapnia: Secondary | ICD-10-CM

## 2012-12-23 DIAGNOSIS — R109 Unspecified abdominal pain: Secondary | ICD-10-CM | POA: Diagnosis present

## 2012-12-23 DIAGNOSIS — K219 Gastro-esophageal reflux disease without esophagitis: Secondary | ICD-10-CM

## 2012-12-23 DIAGNOSIS — D649 Anemia, unspecified: Secondary | ICD-10-CM | POA: Diagnosis present

## 2012-12-23 DIAGNOSIS — R197 Diarrhea, unspecified: Secondary | ICD-10-CM | POA: Diagnosis present

## 2012-12-23 DIAGNOSIS — R634 Abnormal weight loss: Secondary | ICD-10-CM | POA: Diagnosis present

## 2012-12-23 DIAGNOSIS — E119 Type 2 diabetes mellitus without complications: Secondary | ICD-10-CM | POA: Diagnosis present

## 2012-12-23 DIAGNOSIS — Z681 Body mass index (BMI) 19 or less, adult: Secondary | ICD-10-CM

## 2012-12-23 DIAGNOSIS — Z8611 Personal history of tuberculosis: Secondary | ICD-10-CM

## 2012-12-23 DIAGNOSIS — F17200 Nicotine dependence, unspecified, uncomplicated: Secondary | ICD-10-CM

## 2012-12-23 DIAGNOSIS — B20 Human immunodeficiency virus [HIV] disease: Principal | ICD-10-CM

## 2012-12-24 ENCOUNTER — Inpatient Hospital Stay (HOSPITAL_COMMUNITY): Payer: Medicaid Other

## 2012-12-24 ENCOUNTER — Emergency Department (HOSPITAL_COMMUNITY): Payer: Medicaid Other

## 2012-12-24 DIAGNOSIS — A419 Sepsis, unspecified organism: Secondary | ICD-10-CM

## 2012-12-24 DIAGNOSIS — J189 Pneumonia, unspecified organism: Secondary | ICD-10-CM

## 2012-12-24 DIAGNOSIS — R634 Abnormal weight loss: Secondary | ICD-10-CM | POA: Diagnosis present

## 2012-12-24 DIAGNOSIS — J9600 Acute respiratory failure, unspecified whether with hypoxia or hypercapnia: Secondary | ICD-10-CM

## 2012-12-24 DIAGNOSIS — B20 Human immunodeficiency virus [HIV] disease: Secondary | ICD-10-CM

## 2012-12-24 DIAGNOSIS — R6521 Severe sepsis with septic shock: Secondary | ICD-10-CM

## 2012-12-24 LAB — BASIC METABOLIC PANEL
CO2: 16 mEq/L — ABNORMAL LOW (ref 19–32)
Calcium: 5.9 mg/dL — CL (ref 8.4–10.5)
Potassium: 3.6 mEq/L (ref 3.5–5.1)
Sodium: 136 mEq/L (ref 135–145)

## 2012-12-24 LAB — POCT I-STAT 3, ART BLOOD GAS (G3+)
Acid-base deficit: 5 mmol/L — ABNORMAL HIGH (ref 0.0–2.0)
Bicarbonate: 18 mEq/L — ABNORMAL LOW (ref 20.0–24.0)
O2 Saturation: 94 %
Patient temperature: 98.6
pO2, Arterial: 64 mmHg — ABNORMAL LOW (ref 80.0–100.0)

## 2012-12-24 LAB — TYPE AND SCREEN
ABO/RH(D): O POS
Antibody Screen: NEGATIVE

## 2012-12-24 LAB — CREATININE, SERUM
Creatinine, Ser: 0.96 mg/dL (ref 0.50–1.35)
GFR calc Af Amer: >90 mL/min (ref >90)
GFR calc non Af Amer: >90 mL/min (ref >90)

## 2012-12-24 LAB — URINALYSIS, ROUTINE W REFLEX MICROSCOPIC
Leukocytes, UA: NEGATIVE
Nitrite: NEGATIVE
Specific Gravity, Urine: 1.007 (ref 1.005–1.030)
Urobilinogen, UA: 0.2 mg/dL (ref 0.0–1.0)

## 2012-12-24 LAB — CBC
MCH: 25.3 pg — ABNORMAL LOW (ref 26.0–34.0)
MCH: 25.4 pg — ABNORMAL LOW (ref 26.0–34.0)
MCHC: 33.9 g/dL (ref 30.0–36.0)
MCV: 74.5 fL — ABNORMAL LOW (ref 78.0–100.0)
Platelets: 201 10*3/uL (ref 150–400)
Platelets: 211 10*3/uL (ref 150–400)
RBC: 3.66 MIL/uL — ABNORMAL LOW (ref 4.22–5.81)
RBC: 4 MIL/uL — ABNORMAL LOW (ref 4.22–5.81)
WBC: 8.4 10*3/uL (ref 4.0–10.5)

## 2012-12-24 LAB — BLOOD GAS, ARTERIAL
Acid-base deficit: 10.9 mmol/L — ABNORMAL HIGH (ref 0.0–2.0)
Bicarbonate: 14 mEq/L — ABNORMAL LOW (ref 20.0–24.0)
FIO2: 0.6 %
O2 Saturation: 98.9 %
RATE: 22 resp/min
pO2, Arterial: 125 mmHg — ABNORMAL HIGH (ref 80.0–100.0)

## 2012-12-24 LAB — POCT I-STAT, CHEM 8
BUN: 17 mg/dL (ref 6–23)
Calcium, Ion: 1 mmol/L — ABNORMAL LOW (ref 1.12–1.23)
Chloride: 107 mEq/L (ref 96–112)
Glucose, Bld: 93 mg/dL (ref 70–99)
TCO2: 20 mmol/L (ref 0–100)

## 2012-12-24 LAB — GLUCOSE, CAPILLARY: Glucose-Capillary: 120 mg/dL — ABNORMAL HIGH (ref 70–99)

## 2012-12-24 LAB — CARBOXYHEMOGLOBIN
Carboxyhemoglobin: 0.5 % (ref 0.5–1.5)
Methemoglobin: 1.2 % (ref 0.0–1.5)
Total hemoglobin: 9.5 g/dL — ABNORMAL LOW (ref 13.5–18.0)

## 2012-12-24 LAB — GRAM STAIN

## 2012-12-24 LAB — LACTIC ACID, PLASMA: Lactic Acid, Venous: 1.1 mmol/L (ref 0.5–2.2)

## 2012-12-24 LAB — PHOSPHORUS: Phosphorus: 3 mg/dL (ref 2.3–4.6)

## 2012-12-24 LAB — APTT: aPTT: 33 seconds (ref 24–37)

## 2012-12-24 LAB — STREP PNEUMONIAE URINARY ANTIGEN: Strep Pneumo Urinary Antigen: NEGATIVE

## 2012-12-24 LAB — FIBRINOGEN: Fibrinogen: 331 mg/dL (ref 204–475)

## 2012-12-24 MED ORDER — SODIUM CHLORIDE 0.9 % IV BOLUS (SEPSIS)
500.0000 mL | INTRAVENOUS | Status: DC | PRN
  Administered 2012-12-24 (×2): 500 mL via INTRAVENOUS

## 2012-12-24 MED ORDER — PRO-STAT SUGAR FREE PO LIQD
30.0000 mL | Freq: Two times a day (BID) | ORAL | Status: DC
  Administered 2012-12-24 – 2012-12-26 (×6): 30 mL
  Filled 2012-12-24 (×8): qty 30

## 2012-12-24 MED ORDER — LIDOCAINE HCL (CARDIAC) 20 MG/ML IV SOLN
INTRAVENOUS | Status: AC
  Filled 2012-12-24: qty 5

## 2012-12-24 MED ORDER — SULFAMETHOXAZOLE-TMP DS 800-160 MG PO TABS
1.0000 | ORAL_TABLET | Freq: Once | ORAL | Status: AC
  Administered 2012-12-24: 1 via ORAL
  Filled 2012-12-24: qty 1

## 2012-12-24 MED ORDER — MIDAZOLAM HCL 2 MG/2ML IJ SOLN: 4.0000 mg | Freq: Once | INTRAMUSCULAR | Status: DC

## 2012-12-24 MED ORDER — PHENYLEPHRINE HCL 10 MG/ML IJ SOLN
30.0000 ug/min | Freq: Once | INTRAVENOUS | Status: AC
  Administered 2012-12-24: 30 ug/min via INTRAVENOUS
  Filled 2012-12-24: qty 1

## 2012-12-24 MED ORDER — ETOMIDATE 2 MG/ML IV SOLN
INTRAVENOUS | Status: AC
  Administered 2012-12-24: 12 mg
  Filled 2012-12-24: qty 20

## 2012-12-24 MED ORDER — NOREPINEPHRINE BITARTRATE 1 MG/ML IJ SOLN
2.0000 ug/min | INTRAVENOUS | Status: DC | PRN
  Administered 2012-12-24: 15.013 ug/min via INTRAVENOUS
  Filled 2012-12-24 (×2): qty 4

## 2012-12-24 MED ORDER — PANTOPRAZOLE SODIUM 40 MG IV SOLR
40.0000 mg | Freq: Every day | INTRAVENOUS | Status: DC
  Administered 2012-12-24: 40 mg via INTRAVENOUS
  Filled 2012-12-24 (×2): qty 40

## 2012-12-24 MED ORDER — POTASSIUM CHLORIDE 20 MEQ/15ML (10%) PO LIQD
40.0000 meq | Freq: Once | ORAL | Status: AC
  Administered 2012-12-24: 40 meq
  Filled 2012-12-24: qty 30

## 2012-12-24 MED ORDER — MIDAZOLAM HCL 5 MG/5ML IJ SOLN
INTRAMUSCULAR | Status: DC | PRN
  Administered 2012-12-24: 10 mg via INTRAVENOUS

## 2012-12-24 MED ORDER — SODIUM CHLORIDE 0.9 % IV BOLUS (SEPSIS)
1000.0000 mL | Freq: Once | INTRAVENOUS | Status: AC
  Administered 2012-12-24: 1000 mL via INTRAVENOUS

## 2012-12-24 MED ORDER — LEVOFLOXACIN IN D5W 500 MG/100ML IV SOLN
500.0000 mg | INTRAVENOUS | Status: DC
  Administered 2012-12-24 – 2012-12-27 (×4): 500 mg via INTRAVENOUS
  Filled 2012-12-24 (×5): qty 100

## 2012-12-24 MED ORDER — SODIUM CHLORIDE 0.9 % IV BOLUS (SEPSIS): 1000.0000 mL | INTRAVENOUS | Status: DC | PRN

## 2012-12-24 MED ORDER — DEXTROSE 5 % IV SOLN
2.0000 g | INTRAVENOUS | Status: DC
  Filled 2012-12-24: qty 2

## 2012-12-24 MED ORDER — MIDAZOLAM HCL 2 MG/2ML IJ SOLN
INTRAMUSCULAR | Status: AC
  Administered 2012-12-24: 2 mg
  Filled 2012-12-24: qty 2

## 2012-12-24 MED ORDER — OSELTAMIVIR PHOSPHATE 75 MG PO CAPS
75.0000 mg | ORAL_CAPSULE | Freq: Two times a day (BID) | ORAL | Status: DC
  Filled 2012-12-24 (×2): qty 1

## 2012-12-24 MED ORDER — MIDAZOLAM HCL 2 MG/2ML IJ SOLN
INTRAMUSCULAR | Status: AC
  Filled 2012-12-24: qty 4

## 2012-12-24 MED ORDER — DEXTROSE 5 % IV SOLN: 500.0000 mg | INTRAVENOUS | Status: DC

## 2012-12-24 MED ORDER — ROCURONIUM BROMIDE 50 MG/5ML IV SOLN
INTRAVENOUS | Status: AC
  Filled 2012-12-24: qty 2

## 2012-12-24 MED ORDER — HYDROCORTISONE SOD SUCCINATE 100 MG IJ SOLR
50.0000 mg | Freq: Four times a day (QID) | INTRAMUSCULAR | Status: DC
  Administered 2012-12-24 – 2012-12-25 (×7): 50 mg via INTRAVENOUS
  Filled 2012-12-24 (×9): qty 1

## 2012-12-24 MED ORDER — ACETAMINOPHEN 325 MG PO TABS
650.0000 mg | ORAL_TABLET | Freq: Once | ORAL | Status: AC
  Administered 2012-12-24: 650 mg via ORAL
  Filled 2012-12-24: qty 2

## 2012-12-24 MED ORDER — OSELTAMIVIR PHOSPHATE 6 MG/ML PO SUSR
75.0000 mg | Freq: Two times a day (BID) | ORAL | Status: DC
  Administered 2012-12-24 – 2012-12-25 (×4): 75 mg via ORAL
  Filled 2012-12-24 (×6): qty 12.5

## 2012-12-24 MED ORDER — MAGNESIUM SULFATE 40 MG/ML IJ SOLN
2.0000 g | Freq: Once | INTRAMUSCULAR | Status: AC
  Administered 2012-12-24: 2 g via INTRAVENOUS
  Filled 2012-12-24: qty 50

## 2012-12-24 MED ORDER — INSULIN ASPART 100 UNIT/ML ~~LOC~~ SOLN
2.0000 [IU] | SUBCUTANEOUS | Status: DC
  Administered 2012-12-24 (×2): 2 [IU] via SUBCUTANEOUS
  Administered 2012-12-25 (×2): 4 [IU] via SUBCUTANEOUS
  Administered 2012-12-25 (×4): 2 [IU] via SUBCUTANEOUS
  Administered 2012-12-26: 4 [IU] via SUBCUTANEOUS
  Administered 2012-12-26 (×2): 2 [IU] via SUBCUTANEOUS

## 2012-12-24 MED ORDER — PROPOFOL 10 MG/ML IV EMUL
INTRAVENOUS | Status: AC
  Administered 2012-12-24: 1000 mg
  Filled 2012-12-24: qty 100

## 2012-12-24 MED ORDER — PIPERACILLIN-TAZOBACTAM 3.375 G IVPB
3.3750 g | Freq: Once | INTRAVENOUS | Status: AC
  Administered 2012-12-24: 3.375 g via INTRAVENOUS
  Filled 2012-12-24: qty 50

## 2012-12-24 MED ORDER — CHLORHEXIDINE GLUCONATE 0.12 % MT SOLN
15.0000 mL | Freq: Two times a day (BID) | OROMUCOSAL | Status: DC
  Administered 2012-12-24 – 2012-12-27 (×7): 15 mL via OROMUCOSAL
  Filled 2012-12-24 (×8): qty 15

## 2012-12-24 MED ORDER — HYDROCORTISONE SOD SUCCINATE 100 MG IJ SOLR
50.0000 mg | Freq: Four times a day (QID) | INTRAMUSCULAR | Status: DC
  Filled 2012-12-24: qty 1

## 2012-12-24 MED ORDER — VANCOMYCIN HCL IN DEXTROSE 1-5 GM/200ML-% IV SOLN
1000.0000 mg | Freq: Once | INTRAVENOUS | Status: AC
  Administered 2012-12-24: 1000 mg via INTRAVENOUS
  Filled 2012-12-24: qty 200

## 2012-12-24 MED ORDER — DEXTROSE 5 % IV SOLN
2.0000 g | INTRAVENOUS | Status: DC
  Administered 2012-12-24: 2 g via INTRAVENOUS
  Filled 2012-12-24: qty 2

## 2012-12-24 MED ORDER — HEPARIN SODIUM (PORCINE) 5000 UNIT/ML IJ SOLN
5000.0000 [IU] | Freq: Three times a day (TID) | INTRAMUSCULAR | Status: DC
  Administered 2012-12-24 – 2012-12-29 (×16): 5000 [IU] via SUBCUTANEOUS
  Filled 2012-12-24 (×19): qty 1

## 2012-12-24 MED ORDER — SODIUM CHLORIDE 0.9 % IV SOLN
1.0000 mg/h | INTRAVENOUS | Status: DC
  Administered 2012-12-24 (×2): 2 mg/h via INTRAVENOUS
  Administered 2012-12-25: 1 mg/h via INTRAVENOUS
  Filled 2012-12-24 (×3): qty 10

## 2012-12-24 MED ORDER — OXEPA PO LIQD
1000.0000 mL | ORAL | Status: DC
  Administered 2012-12-24 – 2012-12-25 (×2): 1000 mL
  Filled 2012-12-24 (×4): qty 1000

## 2012-12-24 MED ORDER — LIDOCAINE HCL (PF) 1 % IJ SOLN
INTRAMUSCULAR | Status: AC
  Administered 2012-12-24: 5 mL
  Filled 2012-12-24: qty 10

## 2012-12-24 MED ORDER — MIDAZOLAM BOLUS VIA INFUSION
1.0000 mg | INTRAVENOUS | Status: DC | PRN
  Filled 2012-12-24: qty 2

## 2012-12-24 MED ORDER — BIOTENE DRY MOUTH MT LIQD
15.0000 mL | Freq: Four times a day (QID) | OROMUCOSAL | Status: DC
  Administered 2012-12-24 – 2012-12-29 (×18): 15 mL via OROMUCOSAL

## 2012-12-24 MED ORDER — FENTANYL CITRATE 0.05 MG/ML IJ SOLN
50.0000 ug | INTRAMUSCULAR | Status: DC | PRN
  Administered 2012-12-24: 100 ug via INTRAVENOUS
  Administered 2012-12-24 – 2012-12-25 (×5): 50 ug via INTRAVENOUS
  Filled 2012-12-24: qty 2
  Filled 2012-12-24: qty 6
  Filled 2012-12-24 (×3): qty 2

## 2012-12-24 MED ORDER — SUCCINYLCHOLINE CHLORIDE 20 MG/ML IJ SOLN
INTRAMUSCULAR | Status: AC
  Administered 2012-12-24: 60 mg
  Filled 2012-12-24: qty 1

## 2012-12-24 MED ORDER — SODIUM CHLORIDE 0.9 % IV SOLN: 250.0000 mL | INTRAVENOUS | Status: DC | PRN

## 2012-12-24 MED ORDER — PROPOFOL 10 MG/ML IV BOLUS
INTRAVENOUS | Status: AC
  Filled 2012-12-24: qty 20

## 2012-12-24 MED ORDER — SULFAMETHOXAZOLE-TRIMETHOPRIM 400-80 MG/5ML IV SOLN: 15.0000 mg/kg/d | Freq: Three times a day (TID) | INTRAVENOUS | Status: DC

## 2012-12-24 MED ORDER — DOBUTAMINE IN D5W 4-5 MG/ML-% IV SOLN
2.5000 ug/kg/min | INTRAVENOUS | Status: DC | PRN
  Filled 2012-12-24: qty 250

## 2012-12-24 MED ORDER — SODIUM CHLORIDE 0.9 % IV BOLUS (SEPSIS): 500.0000 mL | Freq: Once | INTRAVENOUS | Status: DC

## 2012-12-24 MED ORDER — ADULT MULTIVITAMIN LIQUID CH
5.0000 mL | Freq: Every day | ORAL | Status: DC
  Administered 2012-12-24 – 2012-12-26 (×3): 5 mL
  Filled 2012-12-24 (×4): qty 5

## 2012-12-24 MED ORDER — SULFAMETHOXAZOLE-TRIMETHOPRIM 400-80 MG/5ML IV SOLN
240.0000 mg | Freq: Three times a day (TID) | INTRAVENOUS | Status: DC
  Administered 2012-12-24 – 2012-12-27 (×10): 240 mg via INTRAVENOUS
  Filled 2012-12-24 (×18): qty 15

## 2012-12-24 MED ORDER — PHENYLEPHRINE HCL 10 MG/ML IJ SOLN
30.0000 ug/min | INTRAVENOUS | Status: DC
  Administered 2012-12-24: 30 ug/min via INTRAVENOUS
  Administered 2012-12-25: 25 ug/min via INTRAVENOUS
  Filled 2012-12-24 (×2): qty 1

## 2012-12-24 MED ORDER — MAGNESIUM SULFATE 50 % IJ SOLN: 2.0000 g | Freq: Once | INTRAVENOUS | Status: DC

## 2012-12-24 NOTE — ED Notes (Signed)
The patient says he has had intermittent bouts of flu-like symptoms for 6 months.  He states that the current symptoms have been present x 1 month.  The patient also reports feeling febrile, but he has never taken his temperature.

## 2012-12-24 NOTE — Procedures (Signed)
Central Venous Catheter Insertion Procedure Note Eric Jefferson [id number] 03/28/1961  Procedure: Insertion of Central Venous Catheter Indications: Assessment of intravascular volume, Drug and/or fluid administration and Frequent blood sampling  Procedure Details Consent: Unable to obtain consent because of emergent medical necessity. Time Out: Verified patient identification, verified procedure, site/side was marked, verified correct patient position, special equipment/implants available, medications/allergies/relevent history reviewed, required imaging and test results available.  Performed  Maximum sterile technique was used including antiseptics, cap, gloves, gown, hand hygiene, mask and sheet. Skin prep: Chlorhexidine; local anesthetic administered A antimicrobial bonded/coated triple lumen catheter was placed in the left subclavian vein using the Seldinger technique.  Evaluation Blood flow good Complications: No apparent complications Patient did tolerate procedure well. Chest X-ray ordered to verify placement.  CXR: normal.  Overton Mam, M.D. Pulmonary and Critical Care Medicine Call E-link with questions [phone] 12/24/2012, 3:32 AM

## 2012-12-24 NOTE — Progress Notes (Signed)
INITIAL NUTRITION ASSESSMENT  DOCUMENTATION CODES Per approved criteria  -Not Applicable   INTERVENTION: Initiate Oxepa @ 20 ml/hr via OG tube and increase by 10 ml every 4 hours to goal rate of 40 ml/hr. 30 ml Prostat BID.  At goal rate, tube feeding regimen will provide 1640 kcal (100% of needs), 90 grams of protein (>100% of needs), and 753 ml of H2O.   NUTRITION DIAGNOSIS: Inadequate oral intake related to inability to eat as evidenced by NPO status.  Goal: Pt to meet >/= 90% of their estimated nutrition needs.   Monitor:  Vent status, weight, labs, plan of care  Reason for Assessment: Ventilator  52 y.o. male  Admitting Dx: Shortness of breath and fever  ASSESSMENT: Patient is currently intubated on ventilator support for acute hypoxemic respiratory failure. Pt on sepsis protocol. Pneumonia in severely immunocompromised pt due to HIV. Pt also with hx of tuberculosis.  MV: 13.1 Temp:Temp (24hrs), Avg:99.3 F (37.4 C), Min:97.2 F (36.2 C), Max:102.3 F (39.1 C)  Propofol: none   Height: Ht Readings from Last 1 Encounters:  12/24/12 5\' 4"  (1.626 m)    Weight: Wt Readings from Last 1 Encounters:  12/24/12 110 lb 3.7 oz (50 kg)    Ideal Body Weight: 59 kg  % Ideal Body Weight: 85%  Wt Readings from Last 10 Encounters:  12/24/12 110 lb 3.7 oz (50 kg)  12/07/12 105 lb 12.8 oz (47.991 kg)  12/05/12 102 lb (46.267 kg)  08/01/12 104 lb (47.174 kg)  02/17/10 111 lb 12.8 oz (50.712 kg)  01/05/10 110 lb 4.8 oz (50.032 kg)  12/23/09 110 lb 4.8 oz (50.032 kg)  09/09/09 110 lb 14.4 oz (50.304 kg)  06/15/09 107 lb 1.6 oz (48.58 kg)  05/06/09 105 lb 8 oz (47.854 kg)    Usual Body Weight: 102-111 lb  % Usual Body Weight: within 100%  BMI:  Body mass index is 18.91 kg/(m^2).  Estimated Nutritional Needs: Kcal: 1644 Protein: 75-90 grams Fluid: > 1.7 L/day  Skin: no issues noted  Diet Order: NPO  EDUCATION NEEDS: -No education needs identified at this  time   Intake/Output Summary (Last 24 hours) at 12/24/12 0935 Last data filed at 12/24/12 0840  Gross per 24 hour  Intake 1496.02 ml  Output   1410 ml  Net  86.02 ml    Last BM: PTA   Labs:   Recent Labs Lab 12/24/12 0039 12/24/12 0232 12/24/12 0500  NA 139  --  136  K 3.5  --  3.6  CL 107  --  112  CO2  --   --  16*  BUN 17  --  13  CREATININE 1.20 0.96 0.97  CALCIUM  --   --  5.9*  MG  --   --  1.2*  PHOS  --   --  3.0  GLUCOSE 93  --  123*    CBG (last 3)   Recent Labs  12/24/12 0504 12/24/12 0818  GLUCAP 120* 108*    Scheduled Meds: . antiseptic oral rinse  15 mL Mouth Rinse QID  . cefTRIAXone (ROCEPHIN)  IV  2 g Intravenous Q24H  . chlorhexidine  15 mL Mouth Rinse BID  . heparin  5,000 Units Subcutaneous Q8H  . hydrocortisone sodium succinate  50 mg Intravenous Q6H  . insulin aspart  2-6 Units Subcutaneous Q4H  . levofloxacin (LEVAQUIN) IV  500 mg Intravenous Q24H  . lidocaine (cardiac) 100 mg/28ml      . midazolam      .  midazolam  4 mg Intravenous Once  . oseltamivir  75 mg Oral BID  . pantoprazole (PROTONIX) IV  40 mg Intravenous QHS  . phenylephrine (NEO-SYNEPHRINE) Adult infusion  30-200 mcg/min Intravenous Once  . potassium chloride  40 mEq Per Tube Once  . rocuronium      . sodium chloride  500 mL Intravenous Once  . sulfamethoxazole-trimethoprim  240 mg Intravenous Q8H    Continuous Infusions: . midazolam (VERSED) infusion Stopped (12/24/12 0830)    Past Medical History  Diagnosis Date  . HIV (human immunodeficiency virus infection)   . TB (pulmonary tuberculosis)   . Diabetes     No past surgical history on file.  Kendell Bane RD, LDN, CNSC [phone] Pager [phone] After Hours Pager

## 2012-12-24 NOTE — Progress Notes (Signed)
CRITICAL VALUE ALERT  Critical value received: Calcium Level 5.9  Date of notification:  12-24-12  Time of notification:  0555  Critical value read back:yes  Nurse who received alert:  Jamesetta So RN  MD notified (1st page):  Dr Bea Laura. [place]  Time of first page:  [phone]  Time MD responded:  [phone]

## 2012-12-24 NOTE — H&P (Signed)
PULMONARY  / CRITICAL CARE MEDICINE  Name: Eric Jefferson MRN: [id number] DOB: 15-Mar-1961    ADMISSION DATE:  12/23/2012   CHIEF COMPLAINT:  Shortness of breath and fever  BRIEF PATIENT DESCRIPTION:  52 years old vietnamese man with HIV, DM and history of pulmonary TB, presents with 2 weeks of SOB and fever.  SIGNIFICANT EVENTS / STUDIES:  - Chest X ray with bilateral diffuse interstitial and nodular infiltrates very suggestive of PCP pneumonia  LINES / TUBES: - Left subclavian CVC (12/24/12)  CULTURES: - Blood (12/24/12) - Urine (12/24/12) - Sputum (12/24/12)  ANTIBIOTICS: - Zosyn (12/24/12) - Vancomycin (12/24/12) - Bactrim (12/24/12)  HISTORY OF PRESENT ILLNESS:   52 years old vietnamese man with HIV on antiretroviral therapy, DM and history of pulmonary TB, presents with 2 weeks of SOB and fever. History limited due to poor english speaking. History taken through a family member. Patient unable to speak due to severe respiratory distress. Apparently he has been deteriorating over the last few months but two weeks ago started having fever, productive cough and progressive SOB. At admission hypotensive, tachycardic and hypoxic with PO2 of 64 on 2 L/min via [place]. In severe respiratory distress with RR of 38 to 40. Last viral load 56998 on 11/14/12.  PAST MEDICAL HISTORY :  Past Medical History  Diagnosis Date  . HIV (human immunodeficiency virus infection)   . TB (pulmonary tuberculosis)   . Diabetes    No past surgical history on file. Prior to Admission medications   Medication Sig Start Date End Date Taking? Authorizing Provider  ibuprofen (ADVIL,MOTRIN) 200 MG tablet Take 200 mg by mouth every 6 (six) hours as needed for fever.   Yes Historical Provider, MD  dicyclomine (BENTYL) 20 MG tablet Take 1 tablet (20 mg total) by mouth 2 (two) times daily. 08/07/12 08/07/13  Ginnie Smart, MD  elvitegravir-cobicistat-emtricitabine-tenofovir (STRIBILD) 150-150-200-300 MG TABS Take 1 tablet by  mouth daily with breakfast. 12/19/12   Randall Hiss, MD  famotidine (PEPCID) 20 MG tablet One at bedtime 12/07/12   Nyoka Cowden, MD  omeprazole (PRILOSEC) 20 MG capsule Take 1 capsule (20 mg total) by mouth daily. 12/07/12   Nyoka Cowden, MD  traMADol Janean Sark) 50 MG tablet 1-2 every 4 hours as needed for cough or pain 12/07/12   Nyoka Cowden, MD   No Known Allergies  FAMILY HISTORY:  No family history on file. SOCIAL HISTORY:  reports that he has quit smoking. His smoking use included Cigarettes. He started smoking about 2 months ago. He has a 30 pack-year smoking history. He uses smokeless tobacco. He reports that he does not drink alcohol or use illicit drugs.  REVIEW OF SYSTEMS:  Severe respiratory distress, unable to provide.  SUBJECTIVE:   VITAL SIGNS: Temp:  [102.3 F (39.1 C)] 102.3 F (39.1 C) (02/24 0006) Pulse Rate:  [99-123] 104 (02/24 0301) Resp:  [16-33] 16 (02/24 0301) BP: (63-102)/(38-71) 102/71 mmHg (02/24 0301) SpO2:  [94 %-100 %] 96 % (02/24 0301) FiO2 (%):  [100 %] 100 % (02/24 0301) Weight:  [100 lb (45.36 kg)] 100 lb (45.36 kg) (02/24 0006) HEMODYNAMICS:   VENTILATOR SETTINGS: Vent Mode:  [-] PRVC FiO2 (%):  [100 %] 100 % Set Rate:  [16 bmp] 16 bmp Vt Set:  [480 mL] 480 mL PEEP:  [5 cmH20] 5 cmH20 Plateau Pressure:  [22 cmH20] 22 cmH20 INTAKE / OUTPUT: Intake/Output   None     PHYSICAL EXAMINATION: General: Awake, in severe  respiratory distress. Eyes: Anicteric sclerae. ENT: Oropharynx clear. Dry mucous membranes. No thrush Lymph: No cervical, supraclavicular, or axillary lymphadenopathy. Heart: Normal S1, S2. No murmurs, rubs, or gallops appreciated. No bruits, equal pulses. Lungs: Using accessory respiratory muscles with intercostal retraction, no dullness to percussion. Good air movement bilaterally, without wheezes or crackles. Normal upper airway sounds without evidence of stridor. Abdomen: Abdomen soft, non-tender and not distended,  normoactive bowel sounds. No hepatosplenomegaly or masses. Musculoskeletal: No clubbing or synovitis. Skin: No rashes or lesions Neuro: No focal neurologic deficits. LABS:  Recent Labs Lab 12/24/12 0021 12/24/12 0039 12/24/12 0054  HGB 10.1* 10.9*  --   WBC 10.0  --   --   PLT 211  --   --   NA  --  139  --   K  --  3.5  --   CL  --  107  --   GLUCOSE  --  93  --   BUN  --  17  --   CREATININE  --  1.20  --   LATICACIDVEN  --  1.08  --   TROPONINI <0.30  --   --   PHART  --   --  7.449  PCO2ART  --   --  26.0*  PO2ART  --   --  64.0*   No results found for this basename: GLUCAP,  in the last 168 hours  CXR:  - Bilateral diffuse interstitial and nodular infiltrates suggestive of PCP pneumonia  ASSESSMENT / PLAN:  PULMONARY A: 1) Acute hypoxemic respiratory failure  2) Pneumonia in severely immunocompromised patient due to HIV. Chest X ray suggestive of PCP pneumonia 3) History of tuberculosis P:   - Admit to ICU - - Mechanical ventilation   - PRVC, Vt: 8cc/kg, PEEP: 5, RR: 16, FiO2: 100% and adjust to keep O2 sat > 94% - VAP order set - Albuterol PRN - Pneumocystis DFA, consider bronchoscopy with BAL in am. - Zosyn - Vancomycin - Bactrim - IV steroids for PCP pneumonia and hypoxemia.  CARDIOVASCULAR A:  1) Septic shock, normal lactic acid at admission. P:  - Sepsis protocol ordered - IVF resuscitation per sepsis protocol - Pressors per protocol  RENAL A:   1) No issues   GASTROINTESTINAL A:   1) No issues P:   - GI prophylaxis with protonix - NPO  HEMATOLOGIC A:   1) Anemia P:  - No indication for blood transfusion  INFECTIOUS A:   1) HIV, viral load from January 47829 2) Bilateral pneumonia, likely PCP  3) ID consult in am P:   - Will follow blood, urine and sputum cultures - Pneumocystis DFA. Consider bronchoscopy with BAL - Antibiotics as above - Sepsis protocol  ENDOCRINE A:   1) DM   P:   - ICU hyperglycemia protocol with  SQ insulin  NEUROLOGIC A:   1) No issues P:   - Continuous sedation with propofol   I have personally obtained a history, examined the patient, evaluated laboratory and imaging results, formulated the assessment and plan and placed orders. CRITICAL CARE: The patient is critically ill with multiple organ systems failure and requires high complexity decision making for assessment and support, frequent evaluation and titration of therapies, application of advanced monitoring technologies and extensive interpretation of multiple databases. Critical Care Time devoted to patient care services described in this note is 90 minutes.   Overton Mam MD Pulmonary and Critical Care Medicine [facility] Pager: [phone]  12/24/2012, 3:32  AM

## 2012-12-24 NOTE — ED Provider Notes (Signed)
History     CSN: [id number]  Arrival date & time 12/23/12  2347   First MD Initiated Contact with Patient 12/24/12 0008      Chief Complaint  Patient presents with  . URI  . Hypotension    (Consider location/radiation/quality/duration/timing/severity/associated sxs/prior treatment) HPI  History provided by family bedside to translate her patient. He is HIV positive with history of TB, low C4 counts and followed by infectious disease Dr. [name]. Family is unable to say how compliant he has or has not been with his medications. He has been feeling poorly for last 6 months but worse or lasts 24 hours with high fever and difficulty breathing. No chest pain. No vomiting. No rash. No abdominal pain. Symptoms moderate to severe, persistent and worsening tonight.  Past Medical History  Diagnosis Date  . HIV (human immunodeficiency virus infection)   . TB (pulmonary tuberculosis)   . Diabetes     No past surgical history on file.  No family history on file.  History  Substance Use Topics  . Smoking status: Former Smoker -- 1.00 packs/day for 30 years    Types: Cigarettes    Start date: 10/01/2012  . Smokeless tobacco: Current User  . Alcohol Use: No     Comment: former      Review of Systems  Constitutional: Positive for fever and chills.  HENT: Negative for neck pain.   Eyes: Negative for pain.  Respiratory: Positive for cough and shortness of breath.   Cardiovascular: Negative for leg swelling.  Gastrointestinal: Negative for vomiting and abdominal pain.  Genitourinary: Negative for flank pain.  Musculoskeletal: Negative for back pain.  Skin: Negative for rash.  Neurological: Negative for seizures and syncope.  All other systems reviewed and are negative.    Allergies  Review of patient's allergies indicates no known allergies.  Home Medications   Current Outpatient Rx  Name  Route  Sig  Dispense  Refill  . ibuprofen (ADVIL,MOTRIN) 200 MG tablet   Oral  Take 200 mg by mouth every 6 (six) hours as needed for fever.         . dicyclomine (BENTYL) 20 MG tablet   Oral   Take 1 tablet (20 mg total) by mouth 2 (two) times daily.   30 tablet   0   . elvitegravir-cobicistat-emtricitabine-tenofovir (STRIBILD) 150-150-200-300 MG TABS   Oral   Take 1 tablet by mouth daily with breakfast.   30 tablet   3   . famotidine (PEPCID) 20 MG tablet      One at bedtime   30 tablet   2   . omeprazole (PRILOSEC) 20 MG capsule   Oral   Take 1 capsule (20 mg total) by mouth daily.   30 capsule   11   . traMADol (ULTRAM) 50 MG tablet      1-2 every 4 hours as needed for cough or pain   40 tablet   0     BP 76/50  Pulse 103  Temp(Src) 102.3 F (39.1 C) (Oral)  Ht 5\' 4"  (1.626 m)  Wt 100 lb (45.36 kg)  BMI 17.16 kg/m2  SpO2 100%  Physical Exam  Constitutional:  Cachectic  HENT:  Head: Normocephalic and atraumatic.  Mildly dry mucous membranes  Eyes: EOM are normal. Pupils are equal, round, and reactive to light.  Neck: Neck supple.  Cardiovascular: Regular rhythm and intact distal pulses.   Tachycardic  Pulmonary/Chest: No stridor. He exhibits no tenderness.  Tachypnea with mild respiratory  distress and coarse bilateral breath sounds  Abdominal: Soft. He exhibits no distension. There is no tenderness.  Musculoskeletal: Normal range of motion. He exhibits no edema and no tenderness.  Neurological:  Awake, alert and interactive without focal deficits  Skin: Skin is warm and dry.    ED Course  Procedures (including critical care time)  Results for orders placed during the hospital encounter of 12/23/12  CBC      Result Value Range   WBC 10.0  4.0 - 10.5 K/uL   RBC 4.00 (*) 4.22 - 5.81 MIL/uL   Hemoglobin 10.1 (*) 13.0 - 17.0 g/dL   HCT 96.0 (*) 45.4 - 09.8 %   MCV 74.5 (*) 78.0 - 100.0 fL   MCH 25.3 (*) 26.0 - 34.0 pg   MCHC 33.9  30.0 - 36.0 g/dL   RDW 11.9  14.7 - 82.9 %   Platelets 211  150 - 400 K/uL  LACTATE  DEHYDROGENASE      Result Value Range   LDH 289 (*) 94 - 250 U/L  TROPONIN I      Result Value Range   Troponin I <0.30  <0.30 ng/mL  POCT I-STAT, CHEM 8      Result Value Range   Sodium 139  135 - 145 mEq/L   Potassium 3.5  3.5 - 5.1 mEq/L   Chloride 107  96 - 112 mEq/L   BUN 17  6 - 23 mg/dL   Creatinine, Ser 5.62  0.50 - 1.35 mg/dL   Glucose, Bld 93  70 - 99 mg/dL   Calcium, Ion 1.30 (*) 1.12 - 1.23 mmol/L   TCO2 20  0 - 100 mmol/L   Hemoglobin 10.9 (*) 13.0 - 17.0 g/dL   HCT 86.5 (*) 78.4 - 69.6 %  CG4 I-STAT (LACTIC ACID)      Result Value Range   Lactic Acid, Venous 1.08  0.5 - 2.2 mmol/L  POCT I-STAT 3, BLOOD GAS (G3+)      Result Value Range   pH, Arterial 7.449  7.350 - 7.450   pCO2 arterial 26.0 (*) 35.0 - 45.0 mmHg   pO2, Arterial 64.0 (*) 80.0 - 100.0 mmHg   Bicarbonate 18.0 (*) 20.0 - 24.0 mEq/L   TCO2 19  0 - 100 mmol/L   O2 Saturation 94.0     Acid-base deficit 5.0 (*) 0.0 - 2.0 mmol/L   Patient temperature 98.6 F     Collection site RADIAL, ALLEN'S TEST ACCEPTABLE     Drawn by RT     Sample type ARTERIAL       Dg Chest Portable 1 View  12/24/2012  *RADIOLOGY REPORT*  Clinical Data: Shortness of breath. History of pulmonary tuberculosis and HIV.  PORTABLE CHEST - 1 VIEW  Comparison: 12/05/2012 and prior chest radiographs  Findings: Diffuse bilateral interstitial/airspace nodular opacities are again noted and relatively unchanged. The cardiomediastinal silhouette is stable. There is no evidence of pleural effusion or pneumothorax. No acute bony abnormalities are present.  IMPRESSION: Unchanged diffuse bilateral interstitial/airspace nodular opacities likely representing inflammatory or infectious process.   Original Report Authenticated By: Harmon Pier, M.D.    CRITICAL CARE Performed by: Sunnie Nielsen   Total critical care time: 45  Critical care time was exclusive of separately billable procedures and treating other patients.  Critical care was necessary  to treat or prevent imminent or life-threatening deterioration.  Critical care was time spent personally by me on the following activities: development of treatment plan with patient and/or surrogate  as well as nursing, discussions with consultants, evaluation of patient's response to treatment, examination of patient, obtaining history from patient or surrogate, ordering and performing treatments and interventions, ordering and review of laboratory studies, ordering and review of radiographic studies, pulse oximetry and re-evaluation of patient's condition. Blood cultures/ broad-spectrum IV antibiotics including Bactrim for clinical PCP. code sepsis called. After 2 L IV fluids blood pressure improving, records reviewed and PCCM consult requated  1:35 AM discussed with critical care medicine will evaluate in the ED  PCCM bedside and BP now systolic 60s after 3L, plan elective intubation and PCCM admit  MDM  Respiratory failure HIV positive with fever, hypotension, and bilateral infiltrates on chest x-ray - code sepsis ICU admit        Sunnie Nielsen, MD 12/24/12 [phone]

## 2012-12-24 NOTE — Procedures (Signed)
Bronchoscopy Procedure Note Levii Hairfield [id number] [birth year]  Procedure: Bronchoscopy Indications: HIV positive patient with diffuse pulmonary infiltrates. Bronchoscopy for diagnostic reasons  Procedure Details Consent: Obtained from the daughter the patient is intubated Time Out: Verified patient identification, verified procedure, site/side was marked, verified correct patient position, special equipment/implants available, medications/allergies/relevent history reviewed, required imaging and test results available.  Performed  In preparation for procedure, patient was given 100% FiO2. Sedation: Fentanyl and propofol and topical lidocaine  Airway entered and the following bronchi were examined: RML.  - 20 x 5 of normal saline lavage done with return of clear 20 cc fluid. After this area exam was done on the right side and it looked normal.    Bronchoscope removed.  , Patient placed back on 100% FiO2 at conclusion of procedure.    Evaluation Hemodynamic Status: BP stable throughout; O2 sats: stable throughout Patient's Current Condition: stable Specimens:  Cell Gram stain, cytology, culture of bacteria, fungus and AFB. PCR of respiratory viruses including influenza. Pneumocystis and Legionella stains   Complications: No apparent complications Patient did tolerate procedure well.      Dr. Kalman Shan, M.D., [facility].C.P Pulmonary and Critical Care Medicine Staff Physician [facility] Pager: [phone], If no answer or between  15:00h - 7:00h: call [phone]  12/24/2012 1:39 PM

## 2012-12-24 NOTE — ED Notes (Signed)
Assisted moving pt from A4 to A11.

## 2012-12-24 NOTE — H&P (Signed)
PULMONARY  / CRITICAL CARE MEDICINE  Name: Eric Jefferson MRN: [id number] DOB: 1961/08/16    ADMISSION DATE:  12/23/2012   CHIEF COMPLAINT:  Shortness of breath and fever  Brief:   52 years old [place] man with HIV on antiretroviral therapy, DM and history of pulmonary TB, presents with 2 weeks of SOB and fever. History limited due to poor english speaking. History taken through a family member. Patient unable to speak due to severe respiratory distress. Apparently he has been deteriorating over the last few months but two weeks ago started having fever, productive cough and progressive SOB. At admission hypotensive, tachycardic and hypoxic with PO2 of 64 on 2 L/min via [place]. In severe respiratory distress with RR of 38 to 40. Last viral load 56998 on 11/14/12.   LINES / TUBES: - Left subclavian CVC (12/24/12)  CULTURES:  Recent Labs Lab 12/24/12 0039 12/24/12 0236  LATICACIDVEN 1.08 1.1  PROCALCITON  --  4.66    - MRSA PCR 12/24/2012 > NEG - Blood (12/24/12) - Urine (12/24/12) - Sputum (12/24/12) - Bronchoalveolar lavage 12/24/2012  - Virus PCR  - Pneumocystis and Legionella  - AFB smear and culture  - Fungal smear and culture  - Bacterial Gram stain and culture  ANTIBIOTICS: -  Anti-infectives   Start     Dose/Rate Route Frequency Ordered Stop   12/24/12 1045  cefTRIAXone (ROCEPHIN) 2 g in dextrose 5 % 50 mL IVPB     2 g 100 mL/hr over 30 Minutes Intravenous Every 24 hours 12/24/12 1037     12/24/12 1015  oseltamivir (TAMIFLU) 6 MG/ML suspension 75 mg     75 mg Oral 2 times daily 12/24/12 1005 12/29/12 0959   12/24/12 1000  oseltamivir (TAMIFLU) capsule 75 mg  Status:  Discontinued     75 mg Oral 2 times daily 12/24/12 0900 12/24/12 1005   12/24/12 1000  levofloxacin (LEVAQUIN) IVPB 500 mg     500 mg 100 mL/hr over 60 Minutes Intravenous Every 24 hours 12/24/12 0924     12/24/12 0930  cefTRIAXone (ROCEPHIN) 2 g in dextrose 5 % 50 mL IVPB  Status:  Discontinued     2 g 100  mL/hr over 30 Minutes Intravenous Every 24 hours 12/24/12 0900 12/24/12 1037   12/24/12 0915  azithromycin (ZITHROMAX) 500 mg in dextrose 5 % 250 mL IVPB  Status:  Discontinued     500 mg 250 mL/hr over 60 Minutes Intravenous Every 24 hours 12/24/12 0900 12/24/12 0923   12/24/12 0600  sulfamethoxazole-trimethoprim (BACTRIM) 227.2 mg in dextrose 5 % 250 mL IVPB  Status:  Discontinued     15 mg/kg/day  45.4 kg 264.2 mL/hr over 60 Minutes Intravenous 3 times per day 12/24/12 0239 12/24/12 0452   12/24/12 0600  sulfamethoxazole-trimethoprim (BACTRIM) 240 mg in dextrose 5 % 250 mL IVPB     240 mg 265 mL/hr over 60 Minutes Intravenous 3 times per day 12/24/12 0453     12/24/12 0045  vancomycin (VANCOCIN) IVPB 1000 mg/200 mL premix     1,000 mg 200 mL/hr over 60 Minutes Intravenous  Once 12/24/12 0038 12/24/12 0306   12/24/12 0045  piperacillin-tazobactam (ZOSYN) IVPB 3.375 g     3.375 g 12.5 mL/hr over 240 Minutes Intravenous  Once 12/24/12 0038 12/24/12 0204   12/24/12 0045  sulfamethoxazole-trimethoprim (BACTRIM DS) 800-160 MG per tablet 1 tablet     1 tablet Oral  Once 12/24/12 0038 12/24/12 0121       SIGNIFICANT EVENTS /  STUDIES:  - Chest X ray with bilateral diffuse interstitial and nodular infiltrates very suggestive of PCP pneumonia   SUBJECTIVE:  12/24/2012 is pressor dependent. On 40% oxygen but does not meet spontaneous breathing trial criteria  VITAL SIGNS: Temp:  [97.2 F (36.2 C)-102.3 F (39.1 C)] 97.2 F (36.2 C) (02/24 0840) Pulse Rate:  [28-123] 108 (02/24 1200) Resp:  [16-35] 25 (02/24 1200) BP: (63-127)/(38-89) 125/68 mmHg (02/24 1200) SpO2:  [94 %-100 %] 99 % (02/24 1200) FiO2 (%):  [40 %-100 %] 40 % (02/24 1134) Weight:  [45.36 kg (100 lb)-50 kg (110 lb 3.7 oz)] 50 kg (110 lb 3.7 oz) (02/24 0500) HEMODYNAMICS: CVP:  [6 mmHg-8 mmHg] 8 mmHg VENTILATOR SETTINGS: Vent Mode:  [-] PRVC FiO2 (%):  [40 %-100 %] 40 % Set Rate:  [16 bmp] 16 bmp Vt Set:  [480 mL]  480 mL PEEP:  [5 cmH20] 5 cmH20 Plateau Pressure:  [21 cmH20-24 cmH20] 21 cmH20 INTAKE / OUTPUT: Intake/Output     02/23 0701 - 02/24 0700 02/24 0701 - 02/25 0700   I.V. (mL/kg) 185 (3.7) 138.6 (2.8)   IV Piggyback 1265 50   Total Intake(mL/kg) 1450 (29) 188.6 (3.8)   Urine (mL/kg/hr) 810 1670 (5)   Total Output 810 1670   Net +640 -1481.4          PHYSICAL EXAMINATION: General: Awake intubated looks critically ill  Eyes: Anicteric sclerae. ENT: Oropharynx clear. Dry mucous membranes. No thrush Lymph: No cervical, supraclavicular, or axillary lymphadenopathy. Heart: Normal S1, S2. No murmurs, rubs, or gallops appreciated. No bruits, equal pulses. Lungs: Using accessory respiratory muscles with intercostal retraction, no dullness to percussion. Good air movement bilaterally, without wheezes or crackles. Normal upper airway sounds without evidence of stridor. Abdomen: Abdomen soft, non-tender and not distended, normoactive bowel sounds. No hepatosplenomegaly or masses. Musculoskeletal: No clubbing or synovitis. Skin: No rashes or lesions Neuro: No focal neurologic deficits. LABS:  Recent Labs Lab 12/24/12 0021 12/24/12 0039 12/24/12 0054 12/24/12 0232 12/24/12 0236 12/24/12 0454 12/24/12 0500  HGB 10.1* 10.9*  --   --   --   --  9.3*  WBC 10.0  --   --   --   --   --  8.4  PLT 211  --   --   --   --   --  201  NA  --  139  --   --   --   --  136  K  --  3.5  --   --   --   --  3.6  CL  --  107  --   --   --   --  112  CO2  --   --   --   --   --   --  16*  GLUCOSE  --  93  --   --   --   --  123*  BUN  --  17  --   --   --   --  13  CREATININE  --  1.20  --  0.96  --   --  0.97  CALCIUM  --   --   --   --   --   --  5.9*  MG  --   --   --   --   --   --  1.2*  PHOS  --   --   --   --   --   --  3.0  APTT  --   --   --  33  --   --   --   INR  --   --   --  1.28  --   --   --   LATICACIDVEN  --  1.08  --   --  1.1  --   --   TROPONINI <0.30  --   --   --   --   --    --   PROCALCITON  --   --   --   --  4.66  --   --   PHART  --   --  7.449  --   --  7.319*  --   PCO2ART  --   --  26.0*  --   --  28.0*  --   PO2ART  --   --  64.0*  --   --  125.0*  --     Recent Labs Lab 12/24/12 0504 12/24/12 0818 12/24/12 1249  GLUCAP 120* 108* 136*   imaging x0.48 hours   Dg Chest Portable 1 View  12/24/2012  *RADIOLOGY REPORT*  Clinical Data: Central line placement.  Endotracheal tube placement.  Shortness of breath.  Hypotension.  PORTABLE CHEST - 1 VIEW  Comparison: 12/24/2012 at 0024 hours.  Findings: Interval placement endotracheal tube with tip about 3.8 cm above the carina.  Interval placement of left central venous catheter with tip over the low SVC region.  No pneumothorax.  No blunting of costophrenic angles.  Normal heart size and pulmonary vascularity.  Diffuse airspace and interstitial changes throughout the lungs consistent with edema, infection, or inflammatory process.  No significant change since previous study in the parenchymal pattern.  IMPRESSION: Appliances appear to be in satisfactory location.  No pneumothorax. Persistent bilateral pulmonary infiltrates without change.   Original Report Authenticated By: Burman Nieves, M.D.    Dg Chest Portable 1 View  12/24/2012  *RADIOLOGY REPORT*  Clinical Data: Shortness of breath. History of pulmonary tuberculosis and HIV.  PORTABLE CHEST - 1 VIEW  Comparison: 12/05/2012 and prior chest radiographs  Findings: Diffuse bilateral interstitial/airspace nodular opacities are again noted and relatively unchanged. The cardiomediastinal silhouette is stable. There is no evidence of pleural effusion or pneumothorax. No acute bony abnormalities are present.  IMPRESSION: Unchanged diffuse bilateral interstitial/airspace nodular opacities likely representing inflammatory or infectious process.   Original Report Authenticated By: Harmon Pier, M.D.       ASSESSMENT / PLAN:     PULMONARY  Recent Labs Lab  12/24/12 0039 12/24/12 0054 12/24/12 0454 12/24/12 0525  PHART  --  7.449 7.319*  --   PCO2ART  --  26.0* 28.0*  --   PO2ART  --  64.0* 125.0*  --   HCO3  --  18.0* 14.0*  --   TCO2 20 19 14.8  --   O2SAT  --  94.0 98.9 79.6    A: 1) Acute hypoxemic respiratory failure  2) Pneumonia in severely immunocompromised patient due to HIV. Chest X ray suggestive of PCP pneumonia 3) History of tuberculosis that was treated successfully in the past   - 12/24/2012: Pulmonary infiltrates persist. Does not meet his sbt criteria due to vasopressor dependence P:   - - - Mechanical ventilation   - PRVC, Vt: 8cc/kg, PEEP: 5, RR: 16, FiO2: 100% and adjust to keep O2 sat > 94% - VAP order set - Status post bronchoscopy   CARDIOVASCULAR  Recent Labs Lab 12/24/12 0039 12/24/12 0236  LATICACIDVEN 1.08 1.1  PROCALCITON  --  4.66  No results found for this basename: PROBNP,  in the last 168 hours   Recent Labs Lab 12/24/12 0021  TROPONINI <0.30    A:  1) Septic shock,  P:  - Sepsis protocol ordered - IVF resuscitation per sepsis protocol - Pressors per protocol  RENAL  Recent Labs Lab 12/24/12 0039 12/24/12 0232 12/24/12 0500  NA 139  --  136  K 3.5  --  3.6  CL 107  --  112  CO2  --   --  16*  GLUCOSE 93  --  123*  BUN 17  --  13  CREATININE 1.20 0.96 0.97  CALCIUM  --   --  5.9*  MG  --   --  1.2*  PHOS  --   --  3.0    A:   1) hypomagnesemia  GASTROINTESTINAL  Recent Labs Lab 12/24/12 0232  INR 1.28    A:   1) No issues P:   - GI prophylaxis with protonix Start tube feeds  HEMATOLOGIC  Recent Labs Lab 12/24/12 0021 12/24/12 0039 12/24/12 0500  HGB 10.1* 10.9* 9.3*  HCT 29.8* 32.0* 27.7*  WBC 10.0  --  8.4  PLT 211  --  201    A:   1) Anemia P:  - No indication for blood transfusion - - PRBC for hgb </= 6.9gm%    - exceptions are   -  if ACS susepcted/confirmed then transfuse for hgb </= 8.0gm%,  or    -  If septic shock first 24h and  scvo2 < 70% then transfuse for hgb </= 9.0gm%   - active bleeding with hemodynamic instability, then transfuse regardless of hemoglobin value   At at all times try to transfuse 1 unit prbc as possible with exception of active hemorrhage   INFECTIOUS  Recent Labs Lab 12/24/12 0039 12/24/12 0236  LATICACIDVEN 1.08 1.1  PROCALCITON  --  4.66    A:   1) HIV, viral load from January 47829 and CD4 count > 200 2) Bilateral pneumonia, likely PCP  3) ID consult in am P:   -  Antibiotics as above - Sepsis protocol - Start Tamiflu - Antibiotics as above - Dr. Cliffton Asters infectious disease is consulted  ENDOCRINE A:   1) DM   P:   - ICU hyperglycemia protocol with SQ insulin  NEUROLOGIC A:   1) No issues P:   - Continuous sedation with propofol  Global  - Daughter updated over the phone   I have personally obtained a history, examined the patient, evaluated laboratory and imaging results, formulated the assessment and plan and placed orders. CRITICAL CARE: The patient is critically ill with multiple organ systems failure and requires high complexity decision making for assessment and support, frequent evaluation and titration of therapies, application of advanced monitoring technologies and extensive interpretation of multiple databases. Critical Care Time devoted to patient care services described in this note is 45 minutes.  Independent of procedure time     Dr. Kalman Shan, M.D., [facility].C.P Pulmonary and Critical Care Medicine Staff Physician [facility] [place] [facility] Pager: [phone], If no answer or between  15:00h - 7:00h: call [phone]  12/24/2012 1:47 PM

## 2012-12-24 NOTE — Consult Note (Signed)
[facility]    Date of Admission:  12/23/2012   Day 2 ceftriaxone         Day 2 levofloxacin        Day 2 trimethoprim sulfamethoxazole        Day 2 oseltamivir       Reason for Consult: Subacute, bilateral nodular infiltrates with acute respiratory failure   Referring Physician: Dr. Kalman Shan  Active Problems:   PULMONARY TUBERCULOSIS   HIV DISEASE   TOBACCO ABUSE   DENTAL CARIES   GERD   Bilateral pulmonary infiltrates on CXR   Loss of weight   . antiseptic oral rinse  15 mL Mouth Rinse QID  . cefTRIAXone (ROCEPHIN)  IV  2 g Intravenous Q24H  . chlorhexidine  15 mL Mouth Rinse BID  . feeding supplement (OXEPA)  1,000 mL Per Tube Q24H  . feeding supplement  30 mL Per Tube BID  . heparin  5,000 Units Subcutaneous Q8H  . hydrocortisone sodium succinate  50 mg Intravenous Q6H  . insulin aspart  2-6 Units Subcutaneous Q4H  . levofloxacin (LEVAQUIN) IV  500 mg Intravenous Q24H  . magnesium sulfate 1 - 4 g bolus IVPB  2 g Intravenous Once  . midazolam      . midazolam  4 mg Intravenous Once  . multivitamin  5 mL Per Tube Daily  . oseltamivir  75 mg Oral BID  . pantoprazole (PROTONIX) IV  40 mg Intravenous QHS  . phenylephrine (NEO-SYNEPHRINE) Adult infusion  30-200 mcg/min Intravenous Once  . sodium chloride  500 mL Intravenous Once  . sulfamethoxazole-trimethoprim  240 mg Intravenous Q8H    Recommendations: 1. Continue levofloxacin, trimethoprim sulfamethoxazole and oseltamivir 2. Discontinue ceftriaxone 3. Await results of bronchoscopy 4. Check CD4 count and HIV viral load   Assessment: His pulmonary symptoms and x-ray changes are relatively chronic making routine bacterial pneumonia as an influenza unlikely. Of course, we have to worry about relapse of tuberculosis, nontuberculous mycobacterial infection, fungal infection and malignancy. Hopefully, today's bronchoscopy will shed light on the etiology of the problem. With  recent CD4 count of 230, opportunistic infection related to HIV he is relatively unlikely but I will need to repeat labs now. I will follow with you.    HPI: Eric Jefferson is a 52 y.o. male who is followed by my partner, Dr. Enedina Finner, for HIV infection. He was diagnosed in 2009 around the same time he was found to have active pulmonary tuberculosis. The available notes indicate that he received a complete course of therapy for TB, finishing in November of 2009. He has been bothered by increasing cough, shortness of breath, fevers and unintentional weight loss since around Christmas of last year. He he was seen in our clinic on February 5. Chest x-ray at that time showed diffuse bilateral, nodular infiltrates. Sputum Gram stain showed gram-negative rods and gram-positive cocci in pairs. Routine cultures grew only normal flora. AFB and fungal stains were negative and those cultures are negative to date. He had progressive symptoms and was admitted yesterday and intubated. Chest x-ray is relatively unchanged.  His CD4 count on February 5 was 230 and his HIV viral load was 56,998. He was just recently learned that he has been unable to obtain his [place]. Applications have been sent him for patient assistance to the [facility] AIDS drug assistance program.   Review of Systems: Review of systems not obtained due to patient  factors.  Past Medical History  Diagnosis Date  . HIV (human immunodeficiency virus infection)   . TB (pulmonary tuberculosis)   . Diabetes     History  Substance Use Topics  . Smoking status: Former Smoker -- 1.00 packs/day for 30 years    Types: Cigarettes    Start date: 10/01/2012  . Smokeless tobacco: Current User  . Alcohol Use: No     Comment: former    No family history on file. No Known Allergies  OBJECTIVE: Blood pressure 125/68, pulse 108, temperature 97 F (36.1 C), temperature source Oral, resp. rate 25, height 5\' 4"  (1.626 m), weight 50 kg (110 lb 3.7  oz), SpO2 99.00%. General: He is in the ICU on the ventilator. He opens eyes to voice Skin: No rash Lymph nodes: No palpable adenopathy Lungs: Clear anteriorly Cor: Regular S1 and S2 no murmurs Abdomen: Soft and not obviously tender Joints and extremities: No acute abnormalities  HIV 1 RNA Quant (copies/mL)  Date Value  11/14/2012 56998*  06/20/2012 56965*  02/17/2010 51800*     CD4 T Cell Abs (cmm)  Date Value  12/05/2012 230*  11/14/2012 230*  06/20/2012 250*   Microbiology: Recent Results (from the past 240 hour(s))  GRAM STAIN     Status: None   Collection Time    12/24/12  3:35 AM      Result Value Range Status   Specimen Description SPUTUM   Final   Special Requests NONE   Final   Gram Stain     Final   Value: MODERATE WBC PRESENT,BOTH PMN AND MONONUCLEAR     MODERATE GRAM NEGATIVE RODS     FEW GRAM POSITIVE COCCI IN PAIRS   Report Status 12/24/2012 FINAL   Final  MRSA PCR SCREENING     Status: None   Collection Time    12/24/12  5:06 AM      Result Value Range Status   MRSA by PCR NEGATIVE  NEGATIVE Final   Comment:            The GeneXpert MRSA Assay (FDA     approved for NASAL specimens     only), is one component of a     comprehensive MRSA colonization     surveillance program. It is not     intended to diagnose MRSA     infection nor to guide or     monitor treatment for     MRSA infections.    Cliffton Asters, MD [facility] [phone] pager   [phone] cell 12/24/2012, 3:12 PM

## 2012-12-24 NOTE — ED Notes (Signed)
Advised EDP and charge RN that the patient needs an isolation room for possible PCP.  Advised to move the patient to room 11 once the patient is discharged.

## 2012-12-24 NOTE — Progress Notes (Signed)
ANTIBIOTIC CONSULT NOTE - INITIAL  Pharmacy Consult for vancomycin, Zosyn Indication: rule out pneumonia  No Known Allergies  Patient Measurements: Height: 5\' 4"  (162.6 cm) Weight: 100 lb (45.36 kg) IBW/kg (Calculated) : 59.2  Vital Signs: Temp: 102.3 F (39.1 C) (02/24 0006) Temp src: Oral (02/24 0006) BP: 102/71 mmHg (02/24 0301) Pulse Rate: 104 (02/24 0301) Intake/Output from previous day:   Intake/Output from this shift:    Labs:  Recent Labs  12/24/12 0021 12/24/12 0039  WBC 10.0  --   HGB 10.1* 10.9*  PLT 211  --   CREATININE  --  1.20   Estimated Creatinine Clearance: 45.2 ml/min (by C-G formula based on Cr of 1.2). No results found for this basename: VANCOTROUGH, VANCOPEAK, VANCORANDOM, GENTTROUGH, GENTPEAK, GENTRANDOM, TOBRATROUGH, TOBRAPEAK, TOBRARND, AMIKACINPEAK, AMIKACINTROU, AMIKACIN,  in the last 72 hours   Microbiology: No results found for this or any previous visit (from the past 720 hour(s)).  Medical History: Past Medical History  Diagnosis Date  . HIV (human immunodeficiency virus infection)   . TB (pulmonary tuberculosis)   . Diabetes     Medications:  Scheduled:  . [COMPLETED] acetaminophen  650 mg Oral Once  . [COMPLETED] etomidate      . heparin  5,000 Units Subcutaneous Q8H  . hydrocortisone sodium succinate  50 mg Intravenous Q6H  . insulin aspart  2-6 Units Subcutaneous Q4H  . lidocaine (cardiac) 100 mg/49ml      . [COMPLETED] midazolam      . pantoprazole (PROTONIX) IV  40 mg Intravenous QHS  . rocuronium      . [COMPLETED] succinylcholine      . [COMPLETED] sulfamethoxazole-trimethoprim  1 tablet Oral Once   Assessment: 52 yo male with h/o HIV admitted with r/o pneumonia. Pharmacy to manage vancomycin and Zosyn. Patient has already received vancomycin 1gm x 1 and Zosyn 3.375gm IV x 1. Patient is also to receive Bactrim 5mg /kg Q8H for possible PCP.   Goal of Therapy:  Vancomycin trough level 15-20 mcg/ml  Plan:  1.  Vancomycin 1gm IV Q24H.  2. Zosyn 3.375gm IV Q8H (4 hr infusion).   Emeline Gins 12/24/2012,3:07 AM

## 2012-12-24 NOTE — Procedures (Signed)
Intubation Procedure Note Eric Jefferson [id number] [birth year]  Procedure: Intubation Indications: Respiratory insufficiency  Procedure Details Consent: Unable to obtain consent because of emergent medical necessity. Time Out: Verified patient identification, verified procedure, site/side was marked, verified correct patient position, special equipment/implants available, medications/allergies/relevent history reviewed, required imaging and test results available.  Performed  Medications used: etomidate and midazolam and succinylcholine Equipment used: Glidescope Grade I View Correct placement confirmed by by auscultation, by CXR and ETCO2 monitor Tube secured at 23 cm at the lip  Evaluation Hemodynamic Status: BP stable throughout; O2 sats: stable throughout Patient's Current Condition: stable Complications: No apparent complications Patient did tolerate procedure well. Chest X-ray ordered to verify placement.  CXR: tube position acceptable.   Overton Mam, M.D. Pulmonary and Critical Care Medicine Call E-link with questions [phone] 12/24/2012

## 2012-12-24 NOTE — Progress Notes (Signed)
Pt brought from POD A to be intubated due to work of breathing.  Pt given sedatives and intubated with a 7.5 tube in trauma room B by MD.  No complications at this time.

## 2012-12-25 ENCOUNTER — Inpatient Hospital Stay (HOSPITAL_COMMUNITY): Payer: Medicaid Other

## 2012-12-25 LAB — CBC WITH DIFFERENTIAL/PLATELET
Basophils Absolute: 0 10*3/uL (ref 0.0–0.1)
Basophils Relative: 0 % (ref 0–1)
Eosinophils Absolute: 0 10*3/uL (ref 0.0–0.7)
HCT: 31.4 % — ABNORMAL LOW (ref 39.0–52.0)
Hemoglobin: 10.4 g/dL — ABNORMAL LOW (ref 13.0–17.0)
Lymphocytes Relative: 9 % — ABNORMAL LOW (ref 12–46)
Lymphs Abs: 1.4 10*3/uL (ref 0.7–4.0)
MCH: 24.9 pg — ABNORMAL LOW (ref 26.0–34.0)
MCHC: 33.1 g/dL (ref 30.0–36.0)
MCV: 75.1 fL — ABNORMAL LOW (ref 78.0–100.0)
Neutro Abs: 13.6 10*3/uL — ABNORMAL HIGH (ref 1.7–7.7)
RDW: 16 % — ABNORMAL HIGH (ref 11.5–15.5)

## 2012-12-25 LAB — RESPIRATORY VIRUS PANEL
Influenza A H1: NOT DETECTED
Influenza A H3: NOT DETECTED
Metapneumovirus: NOT DETECTED
Parainfluenza 1: NOT DETECTED
Parainfluenza 3: NOT DETECTED
Respiratory Syncytial Virus A: NOT DETECTED
Rhinovirus: NOT DETECTED

## 2012-12-25 LAB — GLUCOSE, CAPILLARY
Glucose-Capillary: 163 mg/dL — ABNORMAL HIGH (ref 70–99)
Glucose-Capillary: 127 mg/dL — ABNORMAL HIGH (ref 70–99)
Glucose-Capillary: 131 mg/dL — ABNORMAL HIGH (ref 70–99)
Glucose-Capillary: 159 mg/dL — ABNORMAL HIGH (ref 70–99)

## 2012-12-25 LAB — BASIC METABOLIC PANEL
BUN: 15 mg/dL (ref 6–23)
CO2: 18 mEq/L — ABNORMAL LOW (ref 19–32)
Chloride: 111 mEq/L (ref 96–112)
Creatinine, Ser: 0.8 mg/dL (ref 0.50–1.35)
GFR calc Af Amer: >90 mL/min (ref >90)
Glucose, Bld: 134 mg/dL — ABNORMAL HIGH (ref 70–99)
Potassium: 4.1 mEq/L (ref 3.5–5.1)

## 2012-12-25 LAB — LEGIONELLA ANTIGEN, URINE: Legionella Antigen, Urine: NEGATIVE

## 2012-12-25 LAB — PNEUMOCYSTIS JIROVECI SMEAR BY DFA

## 2012-12-25 LAB — URINE CULTURE

## 2012-12-25 LAB — HIV-1 RNA QUANT-NO REFLEX-BLD: HIV 1 RNA Quant: 67721 copies/mL — ABNORMAL HIGH (ref <20)

## 2012-12-25 LAB — MAGNESIUM: Magnesium: 2.1 mg/dL (ref 1.5–2.5)

## 2012-12-25 MED ORDER — PANTOPRAZOLE SODIUM 40 MG PO PACK
40.0000 mg | PACK | Freq: Every day | ORAL | Status: DC
  Administered 2012-12-25 – 2012-12-26 (×2): 40 mg
  Filled 2012-12-25 (×3): qty 20

## 2012-12-25 MED ORDER — PHENYLEPHRINE HCL 10 MG/ML IJ SOLN
30.0000 ug/min | INTRAVENOUS | Status: DC
  Administered 2012-12-25: 15 ug/min via INTRAVENOUS
  Filled 2012-12-25 (×2): qty 4

## 2012-12-25 MED ORDER — SODIUM CHLORIDE 0.9 % IV SOLN
1.0000 g | Freq: Once | INTRAVENOUS | Status: AC
  Administered 2012-12-25: 1 g via INTRAVENOUS
  Filled 2012-12-25: qty 10

## 2012-12-25 NOTE — Clinical Social Work Psychosocial (Signed)
     Clinical Social Work Department BRIEF PSYCHOSOCIAL ASSESSMENT 12/25/2012  Patient:  Eric Jefferson, Eric Jefferson     Account Number:  [id number]     Admit date:  12/23/2012  Clinical Social Worker:  Margaree Mackintosh  Date/Time:  12/25/2012 03:33 PM  Referred by:  RN  Date Referred:  12/25/2012 Referred for  Other - See comment   Other Referral:   Financial Assistance.   Interview type:  Family Other interview type:   Pt currently intubated and sedated.    PSYCHOSOCIAL DATA Living Status:  FAMILY Admitted from facility:   Level of care:   Primary support name:  Waynette Buttery: [phone] Primary support relationship to patient:  NONE Degree of support available:   No Relationship Listed.    CURRENT CONCERNS Current Concerns  Financial Resources   Other Concerns:    SOCIAL WORK ASSESSMENT / PLAN Clinical Social Worker recieved referral from Rn indicating pt's family has questions related to his Medicaid Application.  CSW reviewed chart and met wtih family in waiting area.  CSW introduced self, explained role, and provided support.  CSW provided active listening.  Dtr inquired about pt applying for Medicaid as pt does not currently have insurance; per dtr.  CSW provided resource information to pt's dtr.  Dtr receptive.    Of note: CSW staffed case with MD during progression. Pt does not speak English; pt's dtr does.  Pt currently intubated and sedated and unable to participate in assessment.  MD stated understanding to need of interpreter for pt once pt is able to participate.   Assessment/plan status:  Information/Referral to [employer] Other assessment/ plan:   Information/referral to community resources:   Health visitor.    PATIENTS/FAMILYS RESPONSE TO PLAN OF CARE: Pt currently intubated and sedated; unable to fully participate in assessment.  Family was pleasant and engaged in conversation. Family thanked CSW for intervention.

## 2012-12-25 NOTE — Progress Notes (Signed)
PULMONARY  / CRITICAL CARE MEDICINE  Name: Eric Jefferson MRN: [id number] DOB: Sep 23, 1961    ADMISSION DATE:  12/23/2012  BRIEF PATIENT DESCRIPTION: 52 years old [place] man with HIV on antiretroviral therapy, DM and history of pulmonary TB, presents with 2 weeks of SOB and fever. History limited due to poor english speaking. History taken through a family member. Patient unable to speak due to severe respiratory distress. Apparently he has been deteriorating over the last few months but two weeks ago started having fever, productive cough and progressive SOB. At admission hypotensive, tachycardic and hypoxic with PO2 of 64 on 2 L/min via [place]. In severe respiratory distress with RR of 38 to 40. Last viral load 56998 on 11/14/12.  LINES / TUBES: - Left subclavian CVC (12/24/12) - Left IJ CVC (12/24/12) - PIV left and Right - Foley (12/24/12)  CULTURES:  Recent Labs Lab 12/24/12 0039 12/24/12 0236  LATICACIDVEN 1.08 1.1  PROCALCITON  --  4.66   - MRSA PCR 12/24/2012 > NEG - Blood (12/24/12)>> negative - Urine (12/24/12)>> negative - Sputum (12/24/12)>> reincubated - Bronchoalveolar lavage 12/24/2012  - Virus PCR  - Pneumocystis and Legionella >> PCP pending, Legionella negative  - AFB smear and culture  - Fungal smear and culture  - Bacterial Gram stain and culture >> negative  ANTIBIOTICS: 2/24 Ceftriaxone>> 2/24 2/24 Azythromicin>> 2/24 2/24 Vancomycin >> 2/24 2/24 Zosyn>> 2/24 2/24 Tamiflu>> 2/24 Levofloxacin>> 2/24 Bactrim >>  SIGNIFICANT EVENTS / STUDIES:  - Chest X ray with bilateral diffuse interstitial and nodular infiltrates very suggestive of PCP pneumonia (12/24/2012) - Intubated on mechanical vent  12/24/2012 - On pressors 12/24/2012 Neo- pt developed bradycardia. Changed to Phenylephrine.   SUBJECTIVE:  12/25/2012 is pressor dependent. On 40% oxygen but does not meet spontaneous breathing trial criteria  VITAL SIGNS: Temp:  [97 F (36.1 C)-97.7 F (36.5 C)] 97.4 F  (36.3 C) (02/25 0800) Pulse Rate:  [28-118] 39 (02/25 0830) Resp:  [19-28] 19 (02/25 0830) BP: (79-135)/(54-93) 96/67 mmHg (02/25 0830) SpO2:  [99 %-100 %] 100 % (02/25 0830) FiO2 (%):  [40 %] 40 % (02/25 0801) Weight:  [107 lb 12.9 oz (48.9 kg)] 107 lb 12.9 oz (48.9 kg) (02/25 0459) HEMODYNAMICS:   VENTILATOR SETTINGS: Vent Mode:  [-] PRVC FiO2 (%):  [40 %] 40 % Set Rate:  [16 bmp-22 bmp] 22 bmp Vt Set:  [480 mL] 480 mL PEEP:  [5 cmH20] 5 cmH20 Plateau Pressure:  [12 cmH20-21 cmH20] 14 cmH20  INTAKE / OUTPUT: Intake/Output     02/24 0701 - 02/25 0700 02/25 0701 - 02/26 0700   I.V. (mL/kg) 1142.2 (23.4) 42.5 (0.9)   NG/GT 635 70   IV Piggyback 971    Total Intake(mL/kg) 2748.2 (56.2) 112.5 (2.3)   Urine (mL/kg/hr) 4170 (3.6) 425 (4.7)   Total Output 4170 425   Net -1421.9 -312.5         PHYSICAL EXAMINATION: General: Asleep during examination. Intubated. Critically ill  Eyes: Anicteric sclerae. Lymph: No cervical, supraclavicular, or axillary lymphadenopathy. Heart: Normal S1, S2. No murmurs, rubs, or gallops appreciated. No bruits, equal pulses. Lungs: Good air movement bilaterally, without wheezes or crackles. Normal upper airway sounds without evidence of stridor. Abdomen: Abdomen soft, non-tender and not distended, normoactive bowel sounds. Musculoskeletal: No clubbing or synovitis. Skin: No rashes or lesions Neuro: sedated, intubated.  LABS:  Recent Labs Lab 12/24/12 0021  12/24/12 0039 12/24/12 0054 12/24/12 0232 12/24/12 0236 12/24/12 0454 12/24/12 0500 12/25/12 0410  HGB 10.1*  --  10.9*  --   --   --   --  9.3* 10.4*  WBC 10.0  --   --   --   --   --   --  8.4 16.0*  PLT 211  --   --   --   --   --   --  201 238  NA  --   --  139  --   --   --   --  136 137  K  --   < > 3.5  --   --   --   --  3.6 4.1  CL  --   --  107  --   --   --   --  112 111  CO2  --   --   --   --   --   --   --  16* 18*  GLUCOSE  --   --  93  --   --   --   --  123* 134*   BUN  --   --  17  --   --   --   --  13 15  CREATININE  --   < > 1.20  --  0.96  --   --  0.97 0.80  CALCIUM  --   --   --   --   --   --   --  5.9* 7.3*  MG  --   --   --   --   --   --   --  1.2* 2.1  PHOS  --   --   --   --   --   --   --  3.0 2.9  APTT  --   --   --   --  33  --   --   --   --   INR  --   --   --   --  1.28  --   --   --   --   LATICACIDVEN  --   --  1.08  --   --  1.1  --   --   --   TROPONINI <0.30  --   --   --   --   --   --   --   --   PROCALCITON  --   --   --   --   --  4.66  --   --   --   PHART  --   --   --  7.449  --   --  7.319*  --   --   PCO2ART  --   --   --  26.0*  --   --  28.0*  --   --   PO2ART  --   --   --  64.0*  --   --  125.0*  --   --   < > = values in this interval not displayed.  Recent Labs Lab 12/24/12 1521 12/24/12 1920 12/25/12 0006 12/25/12 0406 12/25/12 0813  GLUCAP 139* 117* 163* 131* 127*   imaging x0.48 hours   Dg Chest Port 1 View  12/25/2012  *RADIOLOGY REPORT*  Clinical Data: Check endotracheal tube  PORTABLE CHEST - 1 VIEW  Comparison: Chest radiograph 12/24/2012  Findings: Endotracheal tube and left central venous line are unchanged.  Stable cardiac silhouette.  There is some improvement in the diffuse fine bilateral air space disease.  No pneumothorax. No acute osseous abnormality.  IMPRESSION: 1.  Stable support apparatus.  2.  Improvement in diffuse fine air space disease.   Original Report Authenticated By: Genevive Bi, M.D.    Dg Chest Portable 1 View  12/24/2012  *RADIOLOGY REPORT*  Clinical Data: Central line placement.  Endotracheal tube placement.  Shortness of breath.  Hypotension.  PORTABLE CHEST - 1 VIEW  Comparison: 12/24/2012 at 0024 hours.  Findings: Interval placement endotracheal tube with tip about 3.8 cm above the carina.  Interval placement of left central venous catheter with tip over the low SVC region.  No pneumothorax.  No blunting of costophrenic angles.  Normal heart size and pulmonary  vascularity.  Diffuse airspace and interstitial changes throughout the lungs consistent with edema, infection, or inflammatory process.  No significant change since previous study in the parenchymal pattern.  IMPRESSION: Appliances appear to be in satisfactory location.  No pneumothorax. Persistent bilateral pulmonary infiltrates without change.   Original Report Authenticated By: Burman Nieves, M.D.    Dg Chest Portable 1 View  12/24/2012  *RADIOLOGY REPORT*  Clinical Data: Shortness of breath. History of pulmonary tuberculosis and HIV.  PORTABLE CHEST - 1 VIEW  Comparison: 12/05/2012 and prior chest radiographs  Findings: Diffuse bilateral interstitial/airspace nodular opacities are again noted and relatively unchanged. The cardiomediastinal silhouette is stable. There is no evidence of pleural effusion or pneumothorax. No acute bony abnormalities are present.  IMPRESSION: Unchanged diffuse bilateral interstitial/airspace nodular opacities likely representing inflammatory or infectious process.   Original Report Authenticated By: Harmon Pier, M.D.    ASSESSMENT / PLAN:  PULMONARY  Recent Labs Lab 12/24/12 0039 12/24/12 0054 12/24/12 0454 12/24/12 0525  PHART  --  7.449 7.319*  --   PCO2ART  --  26.0* 28.0*  --   PO2ART  --  64.0* 125.0*  --   HCO3  --  18.0* 14.0*  --   TCO2 20 19 14.8  --   O2SAT  --  94.0 98.9 79.6   A: 1) Acute hypoxemic respiratory failure  2) Pneumonia in severely immunocompromised patient due to HIV. Chest X ray suggestive of PCP pneumonia 3) History of tuberculosis that was treated successfully in the past   - 12/25/2012: Pulmonary infiltrates mildly impoved. Still does not meet his sbt criteria due to vasopressor dependence. P:   - Mechanical ventilation   - PRVC, Vt: 8cc/kg, PEEP: 5, RR: 16, FiO2: 100% and adjust to keep O2 sat > 94% - VAP order set -  Follow up bronchoscopy lavage results.  CARDIOVASCULAR  Recent Labs Lab 12/24/12 0039  12/24/12 0236  LATICACIDVEN 1.08 1.1  PROCALCITON  --  4.66  No results found for this basename: PROBNP,  in the last 168 hours    Recent Labs Lab 12/24/12 0021  TROPONINI <0.30   A:  1) Septic shock,  - 12/20/2012: [Attending physician statement: Levophed changed toe Neo-Synephrine on account of bradycardia] P:  - Sepsis protocol ordered - Pressors per protocol - On stress dose steroid. Continue until pt is off pressors. - Trend procalcitonin  RENAL   Recent Labs Lab 12/24/12 0039 12/24/12 0232 12/24/12 0500 12/25/12 0410  NA 139  --  136 137  K 3.5  --  3.6 4.1  CL 107  --  112 111  CO2  --   --  16* 18*  GLUCOSE 93  --  123* 134*  BUN 17  --  13 15  CREATININE 1.20 0.96 0.97 0.80  CALCIUM  --   --  5.9* 7.3*  MG  --   --  1.2* 2.1  PHOS  --   --  3.0 2.9    A:   1) hypomagnesemia>> resolved 2) hypocalcemia P: - Ca repleated - Trend BMP, Phos, Mag  GASTROINTESTINAL  Recent Labs Lab 12/24/12 0232  INR 1.28    A:   1) No issues P:   - GI prophylaxis with protonix - Tube Feeds  HEMATOLOGIC  Recent Labs Lab 12/24/12 0021 12/24/12 0039 12/24/12 0500 12/25/12 0410  HGB 10.1* 10.9* 9.3* 10.4*  HCT 29.8* 32.0* 27.7* 31.4*  WBC 10.0  --  8.4 16.0*  PLT 211  --  201 238    A:   1) Anemia P:  - No indication for blood transfusion - - PRBC for hgb </= 6.9gm%    - exceptions are   -  if ACS susepcted/confirmed then transfuse for hgb </= 8.0gm%,  or    -  If septic shock first 24h and scvo2 < 70% then transfuse for hgb </= 9.0gm%   - active bleeding with hemodynamic instability, then transfuse regardless of hemoglobin value   At at all times try to transfuse 1 unit prbc as possible with exception of active hemorrhage  INFECTIOUS  Recent Labs Lab 12/24/12 0039 12/24/12 0236  LATICACIDVEN 1.08 1.1  PROCALCITON  --  4.66   A:   1) HIV, viral load from January 16109 and CD4 count > 200 2) Bilateral pneumonia, likely PCP  3) ID consult  in am P:   - Antibiotics as above - Sepsis protocol- On stress dose steroid. Continue until pt is off pressors. - Trend procalcitonin - Dr. Cliffton Asters infectious disease is consulted  ENDOCRINE A:   1) DM   P:   - ICU hyperglycemia protocol with SQ insulin  NEUROLOGIC A:   1) No issues P:   - Continuous sedation with propofol  Signed D. [place] Rolene Arbour, MD Family Medicine  PGY-2  12/25/2012 8:52 AM   STAFF NOTE: I, Dr Lavinia Sharps have personally reviewed patient's available data, including medical history, events of note, physical examination and test results as part of my evaluation. I have discussed with resident/NP and other care providers such as pharmacist, RN and RRT.  In addition,  I personally evaluated patient and elicited key findings of  acute respiratory failure, septic shock in HIV patient. Does not meet spontaneous breathing trial criteria today due to continued septic shock. Microbiology evaluation still pending. Appreciate infectious diseases consultation. No family at bedside on 12/25/2012.  Rest per NP/medical resident whose note is outlined above and that I agree with  The patient is critically ill with multiple organ systems failure and requires high complexity decision making for assessment and support, frequent evaluation and titration of therapies, application of advanced monitoring technologies and extensive interpretation of multiple databases.   Critical Care Time devoted to patient care services described in this note is  31  Minutes independent of teaching time  Dr. Kalman Shan, M.D., [facility].C.P Pulmonary and Critical Care Medicine Staff Physician [facility] Pulmonary and Critical Care Pager: [phone], If no answer or between  15:00h - 7:00h: call [phone]  12/25/2012 2:19 PM

## 2012-12-25 NOTE — Progress Notes (Signed)
Patient ID: Eric Jefferson, male   DOB: 1961-01-31, 52 y.o.   MRN: [id number]         [facility]    Date of Admission:  12/23/2012           Day 3 levofloxacin        Day 3 trimethoprim sulfamethoxazole        Day 3 oseltamivir Active Problems:   PULMONARY TUBERCULOSIS   HIV DISEASE   TOBACCO ABUSE   DENTAL CARIES   GERD   Bilateral pulmonary infiltrates on CXR   Loss of weight   . antiseptic oral rinse  15 mL Mouth Rinse QID  . chlorhexidine  15 mL Mouth Rinse BID  . feeding supplement (OXEPA)  1,000 mL Per Tube Q24H  . feeding supplement  30 mL Per Tube BID  . heparin  5,000 Units Subcutaneous Q8H  . hydrocortisone sodium succinate  50 mg Intravenous Q6H  . insulin aspart  2-6 Units Subcutaneous Q4H  . levofloxacin (LEVAQUIN) IV  500 mg Intravenous Q24H  . midazolam  4 mg Intravenous Once  . multivitamin  5 mL Per Tube Daily  . oseltamivir  75 mg Oral BID  . pantoprazole sodium  40 mg Per Tube Daily  . sulfamethoxazole-trimethoprim  240 mg Intravenous Q8H   Objective: Temp:  [97.4 F (36.3 C)-97.8 F (36.6 C)] 97.8 F (36.6 C) (02/25 1554) Pulse Rate:  [33-68] 40 (02/25 1730) Resp:  [17-27] 19 (02/25 1730) BP: (87-132)/(56-85) 99/58 mmHg (02/25 1700) SpO2:  [100 %] 100 % (02/25 1730) FiO2 (%):  [40 %] 40 % (02/25 1612) Weight:  [48.9 kg (107 lb 12.9 oz)] 48.9 kg (107 lb 12.9 oz) (02/25 0459)  General: Remains sedated on the ventilator Skin: No rash Lungs: Clear anteriorly Cor: Regular S1-S2 no murmurs  Lab Results Lab Results  Component Value Date   WBC 16.0* 12/25/2012   HGB 10.4* 12/25/2012   HCT 31.4* 12/25/2012   MCV 75.1* 12/25/2012   PLT 238 12/25/2012    Lab Results  Component Value Date   CREATININE 0.80 12/25/2012   BUN 15 12/25/2012   NA 137 12/25/2012   K 4.1 12/25/2012   CL 111 12/25/2012   CO2 18* 12/25/2012    Lab Results  Component Value Date   ALT 15 11/14/2012   AST 26 11/14/2012   ALKPHOS 93 11/14/2012   BILITOT 0.3  11/14/2012      Microbiology: Recent Results (from the past 240 hour(s))  CULTURE, BLOOD (ROUTINE X 2)     Status: None   Collection Time    12/24/12 12:25 AM      Result Value Range Status   Specimen Description BLOOD RIGHT ARM   Final   Special Requests BOTTLES DRAWN AEROBIC ONLY 4CC   Final   Culture  Setup Time 12/24/2012 12:57   Final   Culture     Final   Value:        BLOOD CULTURE RECEIVED NO GROWTH TO DATE CULTURE WILL BE HELD FOR 5 DAYS BEFORE ISSUING A FINAL NEGATIVE REPORT   Report Status PENDING   Incomplete  CULTURE, BLOOD (ROUTINE X 2)     Status: None   Collection Time    12/24/12 12:30 AM      Result Value Range Status   Specimen Description BLOOD RIGHT ARM   Final   Special Requests BOTTLES DRAWN AEROBIC ONLY 3CC   Final   Culture  Setup Time 12/24/2012 12:57  Final   Culture     Final   Value:        BLOOD CULTURE RECEIVED NO GROWTH TO DATE CULTURE WILL BE HELD FOR 5 DAYS BEFORE ISSUING A FINAL NEGATIVE REPORT   Report Status PENDING   Incomplete  CULTURE, RESPIRATORY (NON-EXPECTORATED)     Status: None   Collection Time    12/24/12  3:35 AM      Result Value Range Status   Specimen Description SPUTUM   Final   Special Requests NONE   Final   Gram Stain     Final   Value: FEW WBC PRESENT,BOTH PMN AND MONONUCLEAR     RARE SQUAMOUS EPITHELIAL CELLS PRESENT     FEW GRAM NEGATIVE RODS     FEW GRAM POSITIVE COCCI IN PAIRS     IN CLUSTERS RARE GRAM POSITIVE RODS   Culture Culture reincubated for better growth   Final   Report Status PENDING   Incomplete  GRAM STAIN     Status: None   Collection Time    12/24/12  3:35 AM      Result Value Range Status   Specimen Description SPUTUM   Final   Special Requests NONE   Final   Gram Stain     Final   Value: MODERATE WBC PRESENT,BOTH PMN AND MONONUCLEAR     MODERATE GRAM NEGATIVE RODS     FEW GRAM POSITIVE COCCI IN PAIRS   Report Status 12/24/2012 FINAL   Final  MRSA PCR SCREENING     Status: None   Collection  Time    12/24/12  5:06 AM      Result Value Range Status   MRSA by PCR NEGATIVE  NEGATIVE Final   Comment:            The GeneXpert MRSA Assay (FDA     approved for NASAL specimens     only), is one component of a     comprehensive MRSA colonization     surveillance program. It is not     intended to diagnose MRSA     infection nor to guide or     monitor treatment for     MRSA infections.  URINE CULTURE     Status: None   Collection Time    12/24/12  5:07 AM      Result Value Range Status   Specimen Description URINE, CATHETERIZED   Final   Special Requests Immunocompromised   Final   Culture  Setup Time 12/24/2012 05:52   Final   Colony Count NO GROWTH   Final   Culture NO GROWTH   Final   Report Status 12/25/2012 FINAL   Final  AFB CULTURE WITH SMEAR     Status: None   Collection Time    12/24/12 11:16 AM      Result Value Range Status   Specimen Description BRONCHIAL ALVEOLAR LAVAGE   Final   Special Requests Immunocompromised   Final   ACID FAST SMEAR NO ACID FAST BACILLI SEEN   Final   Culture     Final   Value: CULTURE WILL BE EXAMINED FOR 6 WEEKS BEFORE ISSUING A FINAL REPORT   Report Status PENDING   Incomplete  CULTURE, BAL-QUANTITATIVE     Status: None   Collection Time    12/24/12 11:16 AM      Result Value Range Status   Specimen Description BRONCHIAL ALVEOLAR LAVAGE   Final   Special Requests Immunocompromised  Final   Gram Stain     Final   Value: FEW WBC PRESENT,BOTH PMN AND MONONUCLEAR     NO SQUAMOUS EPITHELIAL CELLS SEEN     NO ORGANISMS SEEN   Colony Count PENDING   Incomplete   Culture NO GROWTH 1 DAY   Final   Report Status PENDING   Incomplete  FUNGUS CULTURE W SMEAR     Status: None   Collection Time    12/24/12 11:16 AM      Result Value Range Status   Specimen Description BRONCHIAL ALVEOLAR LAVAGE   Final   Special Requests Immunocompromised   Final   Fungal Smear NO YEAST OR FUNGAL ELEMENTS SEEN   Final   Culture CULTURE IN PROGRESS  FOR FOUR WEEKS   Final   Report Status PENDING   Incomplete  LEGIONELLA CULTURE     Status: None   Collection Time    12/24/12 11:16 AM      Result Value Range Status   Specimen Description BRONCHIAL ALVEOLAR LAVAGE   Final   Special Requests Immunocompromised   Final   Culture     Final   Value: NO LEGIONELLA ISOLATED, CULTURE IN PROGRESS FOR 5 DAYS   Report Status PENDING   Incomplete  PNEUMOCYSTIS JIROVECI SMEAR BY DFA     Status: None   Collection Time    12/24/12 11:16 AM      Result Value Range Status   Specimen Source-[facility] BRONCHIAL ALVEOLAR LAVAGE   Final   Pneumocystis jiroveci Ag NEGATIVE   Final   Comment: Performed at [facility]    Studies/Results: Dg Chest Port 1 View  12/25/2012  *RADIOLOGY REPORT*  Clinical Data: Check endotracheal tube  PORTABLE CHEST - 1 VIEW  Comparison: Chest radiograph 12/24/2012  Findings: Endotracheal tube and left central venous line are unchanged.  Stable cardiac silhouette.  There is some improvement in the diffuse fine bilateral air space disease.  No pneumothorax. No acute osseous abnormality.  IMPRESSION: 1.  Stable support apparatus.  2.  Improvement in diffuse fine air space disease.   Original Report Authenticated By: Genevive Bi, M.D.    Dg Chest Portable 1 View  12/24/2012  *RADIOLOGY REPORT*  Clinical Data: Central line placement.  Endotracheal tube placement.  Shortness of breath.  Hypotension.  PORTABLE CHEST - 1 VIEW  Comparison: 12/24/2012 at 0024 hours.  Findings: Interval placement endotracheal tube with tip about 3.8 cm above the carina.  Interval placement of left central venous catheter with tip over the low SVC region.  No pneumothorax.  No blunting of costophrenic angles.  Normal heart size and pulmonary vascularity.  Diffuse airspace and interstitial changes throughout the lungs consistent with edema, infection, or inflammatory process.  No significant change since previous study in the parenchymal pattern.   IMPRESSION: Appliances appear to be in satisfactory location.  No pneumothorax. Persistent bilateral pulmonary infiltrates without change.   Original Report Authenticated By: Burman Nieves, M.D.    Dg Chest Portable 1 View  12/24/2012  *RADIOLOGY REPORT*  Clinical Data: Shortness of breath. History of pulmonary tuberculosis and HIV.  PORTABLE CHEST - 1 VIEW  Comparison: 12/05/2012 and prior chest radiographs  Findings: Diffuse bilateral interstitial/airspace nodular opacities are again noted and relatively unchanged. The cardiomediastinal silhouette is stable. There is no evidence of pleural effusion or pneumothorax. No acute bony abnormalities are present.  IMPRESSION: Unchanged diffuse bilateral interstitial/airspace nodular opacities likely representing inflammatory or infectious process.   Original Report  Authenticated By: Harmon Pier, M.D.     Assessment: So far the results of bronchoscopy and sputum cultures earlier this month have not revealed a cause for his subacute pneumonia. His LDH is slightly elevated but this is nonspecific. Unfortunately his CD4 count did not make today is run. He has had 2 negative AFB smears over the past 3 weeks and is AFB culture from February 6 is negative so far. I certainly doubt this is a relapse of tuberculosis since records indicate he completed treatment several years ago. Also, I would expect that he would've been much sicker, sooner if all of this was due to tuberculosis. I will take him out of airborne precautions but leaving him in droplet caution since his respiratory virus panel is not back yet. His chest x-ray looks a little bit better today so I will continue his current empiric antimicrobial regimen. No malignant cells were seen on cytology exam of BAL fluid.  Plan: 1. Continue current antimicrobial regimen 2. Await results of cultures, CD4 count, and respiratory virus panel  Cliffton Asters, MD [facility] [phone] pager   [phone] cell 12/25/2012, 5:57 PM

## 2012-12-26 ENCOUNTER — Inpatient Hospital Stay (HOSPITAL_COMMUNITY): Payer: Medicaid Other

## 2012-12-26 LAB — CULTURE, BAL-QUANTITATIVE W GRAM STAIN
Colony Count: NO GROWTH
Culture: NO GROWTH

## 2012-12-26 LAB — GLUCOSE, CAPILLARY
Glucose-Capillary: 138 mg/dL — ABNORMAL HIGH (ref 70–99)
Glucose-Capillary: 154 mg/dL — ABNORMAL HIGH (ref 70–99)
Glucose-Capillary: 112 mg/dL — ABNORMAL HIGH (ref 70–99)
Glucose-Capillary: 108 mg/dL — ABNORMAL HIGH (ref 70–99)

## 2012-12-26 LAB — CBC WITH DIFFERENTIAL/PLATELET
Basophils Absolute: 0 10*3/uL (ref 0.0–0.1)
Eosinophils Relative: 0 % (ref 0–5)
HCT: 28 % — ABNORMAL LOW (ref 39.0–52.0)
Hemoglobin: 9.6 g/dL — ABNORMAL LOW (ref 13.0–17.0)
Lymphocytes Relative: 7 % — ABNORMAL LOW (ref 12–46)
MCHC: 34.3 g/dL (ref 30.0–36.0)
MCV: 73.1 fL — ABNORMAL LOW (ref 78.0–100.0)
Monocytes Absolute: 1.1 10*3/uL — ABNORMAL HIGH (ref 0.1–1.0)
Monocytes Relative: 6 % (ref 3–12)
RDW: 16.1 % — ABNORMAL HIGH (ref 11.5–15.5)

## 2012-12-26 LAB — CULTURE, RESPIRATORY W GRAM STAIN: Culture: NORMAL

## 2012-12-26 LAB — BASIC METABOLIC PANEL
BUN: 18 mg/dL (ref 6–23)
CO2: 21 mEq/L (ref 19–32)
Calcium: 7.9 mg/dL — ABNORMAL LOW (ref 8.4–10.5)
Creatinine, Ser: 0.77 mg/dL (ref 0.50–1.35)

## 2012-12-26 MED ORDER — MAGNESIUM SULFATE IN D5W 10-5 MG/ML-% IV SOLN
1.0000 g | Freq: Once | INTRAVENOUS | Status: AC
  Administered 2012-12-26: 1 g via INTRAVENOUS
  Filled 2012-12-26: qty 100

## 2012-12-26 MED ORDER — FUROSEMIDE 10 MG/ML IJ SOLN
INTRAMUSCULAR | Status: AC
  Filled 2012-12-26: qty 2

## 2012-12-26 MED ORDER — ELVITEG-COBIC-EMTRICIT-TENOFDF 150-150-200-300 MG PO TABS
1.0000 | ORAL_TABLET | Freq: Every day | ORAL | Status: DC
Start: 2012-12-27 — End: 2012-12-29
  Administered 2012-12-27 – 2012-12-29 (×3): 1 via ORAL
  Filled 2012-12-26 (×4): qty 1

## 2012-12-26 MED ORDER — POTASSIUM CHLORIDE 20 MEQ/15ML (10%) PO LIQD
ORAL | Status: AC
  Filled 2012-12-26: qty 30

## 2012-12-26 MED ORDER — PREDNISONE 20 MG PO TABS: 20.0000 mg | ORAL_TABLET | Freq: Every day | ORAL | Status: DC

## 2012-12-26 MED ORDER — POTASSIUM CHLORIDE 20 MEQ/15ML (10%) PO LIQD: 20.0000 meq | Freq: Once | ORAL | Status: DC

## 2012-12-26 MED ORDER — PREDNISONE 20 MG PO TABS
40.0000 mg | ORAL_TABLET | Freq: Two times a day (BID) | ORAL | Status: DC
  Administered 2012-12-26 – 2012-12-29 (×7): 40 mg via ORAL
  Filled 2012-12-26 (×9): qty 2

## 2012-12-26 MED ORDER — FUROSEMIDE 10 MG/ML IJ SOLN
20.0000 mg | Freq: Once | INTRAMUSCULAR | Status: AC
  Administered 2012-12-26: 20 mg via INTRAVENOUS

## 2012-12-26 MED ORDER — PREDNISONE 20 MG PO TABS: 40.0000 mg | ORAL_TABLET | Freq: Every day | ORAL | Status: DC

## 2012-12-26 MED ORDER — SODIUM CHLORIDE 0.9 % IV BOLUS (SEPSIS)
500.0000 mL | Freq: Once | INTRAVENOUS | Status: AC
  Administered 2012-12-26: 500 mL via INTRAVENOUS

## 2012-12-26 MED ORDER — POTASSIUM CHLORIDE 20 MEQ/15ML (10%) PO LIQD
40.0000 meq | ORAL | Status: AC
  Administered 2012-12-26 (×2): 40 meq
  Filled 2012-12-26 (×2): qty 30

## 2012-12-26 NOTE — Procedures (Signed)
Extubation Procedure Note  Patient Details:   Name: Eric Jefferson DOB: 12-03-1960 MRN: [id number]   Airway Documentation:     Evaluation  O2 sats: stable throughout Complications: No apparent complications Patient did tolerate procedure well. Bilateral Breath Sounds: Clear;Diminished Suctioning: Airway Yes  Patient's airway and mouth suctioned throughly, extubated and placed on 2LNC. HR-91 100% RR-23 79/38. No stridor present, clear/diminished breathsounds  Adolm Joseph 12/26/2012, 11:08 AM

## 2012-12-26 NOTE — Progress Notes (Addendum)
INFECTIOUS DISEASE PROGRESS NOTE  ID: Eric Jefferson is a 52 y.o. male with   Active Problems:   PULMONARY TUBERCULOSIS   HIV DISEASE   TOBACCO ABUSE   DENTAL CARIES   GERD   Bilateral pulmonary infiltrates on CXR   Loss of weight  Subjective: On vent, up in chair,   Abtx:  Anti-infectives   Start     Dose/Rate Route Frequency Ordered Stop   12/24/12 1045  cefTRIAXone (ROCEPHIN) 2 g in dextrose 5 % 50 mL IVPB  Status:  Discontinued     2 g 100 mL/hr over 30 Minutes Intravenous Every 24 hours 12/24/12 1037 12/24/12 1523   12/24/12 1015  oseltamivir (TAMIFLU) 6 MG/ML suspension 75 mg     75 mg Oral 2 times daily 12/24/12 1005 12/29/12 0959   12/24/12 1000  oseltamivir (TAMIFLU) capsule 75 mg  Status:  Discontinued     75 mg Oral 2 times daily 12/24/12 0900 12/24/12 1005   12/24/12 1000  levofloxacin (LEVAQUIN) IVPB 500 mg     500 mg 100 mL/hr over 60 Minutes Intravenous Every 24 hours 12/24/12 0924     12/24/12 0930  cefTRIAXone (ROCEPHIN) 2 g in dextrose 5 % 50 mL IVPB  Status:  Discontinued     2 g 100 mL/hr over 30 Minutes Intravenous Every 24 hours 12/24/12 0900 12/24/12 1037   12/24/12 0915  azithromycin (ZITHROMAX) 500 mg in dextrose 5 % 250 mL IVPB  Status:  Discontinued     500 mg 250 mL/hr over 60 Minutes Intravenous Every 24 hours 12/24/12 0900 12/24/12 0923   12/24/12 0600  sulfamethoxazole-trimethoprim (BACTRIM) 227.2 mg in dextrose 5 % 250 mL IVPB  Status:  Discontinued     15 mg/kg/day  45.4 kg 264.2 mL/hr over 60 Minutes Intravenous 3 times per day 12/24/12 0239 12/24/12 0452   12/24/12 0600  sulfamethoxazole-trimethoprim (BACTRIM) 240 mg in dextrose 5 % 250 mL IVPB     240 mg 265 mL/hr over 60 Minutes Intravenous 3 times per day 12/24/12 0453     12/24/12 0045  vancomycin (VANCOCIN) IVPB 1000 mg/200 mL premix     1,000 mg 200 mL/hr over 60 Minutes Intravenous  Once 12/24/12 0038 12/24/12 0306   12/24/12 0045  piperacillin-tazobactam (ZOSYN) IVPB 3.375 g     3.375 g 12.5 mL/hr over 240 Minutes Intravenous  Once 12/24/12 0038 12/24/12 0204   12/24/12 0045  sulfamethoxazole-trimethoprim (BACTRIM DS) 800-160 MG per tablet 1 tablet     1 tablet Oral  Once 12/24/12 0038 12/24/12 0121      Medications:  Scheduled: . antiseptic oral rinse  15 mL Mouth Rinse QID  . chlorhexidine  15 mL Mouth Rinse BID  . feeding supplement (OXEPA)  1,000 mL Per Tube Q24H  . feeding supplement  30 mL Per Tube BID  . heparin  5,000 Units Subcutaneous Q8H  . insulin aspart  2-6 Units Subcutaneous Q4H  . levofloxacin (LEVAQUIN) IV  500 mg Intravenous Q24H  . magnesium sulfate 1 - 4 g bolus IVPB  1 g Intravenous Once  . midazolam  4 mg Intravenous Once  . multivitamin  5 mL Per Tube Daily  . oseltamivir  75 mg Oral BID  . pantoprazole sodium  40 mg Per Tube Daily  . potassium chloride  40 mEq Per Tube Q4H  . sulfamethoxazole-trimethoprim  240 mg Intravenous Q8H    Objective: Vital signs in last 24 hours: Temp:  [97.5 F (36.4 C)-98.5 F (36.9 C)]  98.1 F (36.7 C) (02/26 0748) Pulse Rate:  [37-73] 69 (02/26 0831) Resp:  [17-26] 22 (02/26 0831) BP: (78-119)/(42-78) 113/69 mmHg (02/26 0700) SpO2:  [99 %-100 %] 100 % (02/26 0831) FiO2 (%):  [40 %] 40 % (02/26 0831) Weight:  [48.4 kg (106 lb 11.2 oz)] 48.4 kg (106 lb 11.2 oz) (02/26 0445)   General appearance: alert, cooperative and no distress Resp: rhonchi bilaterally Chest wall: L chest TLC clean.  Cardio: regular rate and rhythm GI: normal findings: bowel sounds normal and soft, non-tender  Lab Results  Recent Labs  12/25/12 0410 12/26/12 0408  WBC 16.0* 17.4*  HGB 10.4* 9.6*  HCT 31.4* 28.0*  NA 137 137  K 4.1 3.3*  CL 111 107  CO2 18* 21  BUN 15 18  CREATININE 0.80 0.77   Liver Panel No results found for this basename: PROT, ALBUMIN, AST, ALT, ALKPHOS, BILITOT, BILIDIR, IBILI,  in the last 72 hours Sedimentation Rate No results found for this basename: ESRSEDRATE,  in the last 72  hours C-Reactive Protein No results found for this basename: CRP,  in the last 72 hours  Microbiology:   Studies/Results: Dg Chest Port 1 View  12/26/2012  *RADIOLOGY REPORT*  Clinical Data: Assess endotracheal tube.  PORTABLE CHEST - 1 VIEW  Comparison: 12/25/2012.  Findings: Endotracheal tube is in satisfactory position.  Left subclavian central line tip projects over the SVC.  Heart size normal.  There is moderate diffuse bilateral air space disease, unchanged.  No definite pleural fluid.  IMPRESSION: Moderate diffuse bilateral air space disease, suspicious for atypical or fungal infection.  Disseminated tuberculosis is not excluded.   Original Report Authenticated By: Leanna Battles, M.D.    Dg Chest Port 1 View  12/25/2012  *RADIOLOGY REPORT*  Clinical Data: Check endotracheal tube  PORTABLE CHEST - 1 VIEW  Comparison: Chest radiograph 12/24/2012  Findings: Endotracheal tube and left central venous line are unchanged.  Stable cardiac silhouette.  There is some improvement in the diffuse fine bilateral air space disease.  No pneumothorax. No acute osseous abnormality.  IMPRESSION: 1.  Stable support apparatus.  2.  Improvement in diffuse fine air space disease.   Original Report Authenticated By: Genevive Bi, M.D.      Assessment/Plan: Respiratory failure Insterstitial/Nodular infiltrates   AFB (-)   Respiratory virus panel on BAL (-)  Legionella Cx (-), Fungal Cx (-) AIDS  Will stop oseltamivir  Will send crypto Ag, send PCP DFA  Was Bx done on Bronch? Her cytology was (- for malignancy)  Given the duration of his sx, routine pathogens such as influenza, strep, seem less likely  Will restart his stribilid. Change levaquin and bactrim to po after extubated (probably today).  Would restart his steroids, his PO2 was 64 on admission. Pred 40mg  bid  He will need bridge/adherence counseling at d/c   Day 4 levofloxacin  Day 4 trimethoprim sulfamethoxazole  Day 4  oseltamivir  Johny Sax Infectious Diseases [phone] 12/26/2012, 9:32 AM   LOS: 3 days

## 2012-12-26 NOTE — Progress Notes (Signed)
PULMONARY  / CRITICAL CARE MEDICINE  Name: Eric Jefferson MRN: [id number] DOB: Oct 06, 1961    ADMISSION DATE:  12/23/2012  BRIEF PATIENT DESCRIPTION: 52 years old [place] man with HIV on antiretroviral therapy, DM and history of pulmonary TB, presents with 2 weeks of SOB and fever. History limited due to poor english speaking. History taken through a family member. Patient unable to speak due to severe respiratory distress. Apparently he has been deteriorating over the last few months but two weeks ago started having fever, productive cough and progressive SOB. At admission hypotensive, tachycardic and hypoxic with PO2 of 64 on 2 L/min via [place]. In severe respiratory distress with RR of 38 to 40. Last viral load 56998 on 11/14/12.  LINES / TUBES: - Left subclavian CVC (12/24/12) - Left IJ CVC (12/24/12) - PIV left and Right - Foley (12/24/12)  CULTURES:  Recent Labs Lab 12/24/12 0039 12/24/12 0236 12/25/12 1037 12/26/12 0408  LATICACIDVEN 1.08 1.1  --   --   PROCALCITON  --  4.66 3.56 2.57   - MRSA PCR 12/24/2012 > NEG - Blood (12/24/12)>> negative - Urine (12/24/12)>> negative - Sputum (12/24/12)>> reincubated - Bronchoalveolar lavage 12/24/2012  - Virus PCR >> negative  - Pneumocystis and Legionella >> PCP negative, Legionella negative  - AFB smear and culture >> negative  - Fungal smear and culture >> negative  - Bacterial Gram stain and culture >> negative             - Cytology >> negative for malignancy. - Cripto antigen(12/26/2012)>> - PCP DFA(12/26/2012)>>  ANTIBIOTICS: 2/24 Ceftriaxone>> 2/24 2/24 Azythromicin>> 2/24 2/24 Vancomycin >> 2/24 2/24 Zosyn>> 2/24 2/24 Tamiflu>> 2/26 2/24 Levofloxacin>> 2/24 Bactrim >>  SIGNIFICANT EVENTS / STUDIES:  - Chest X ray with bilateral diffuse interstitial and nodular infiltrates very suggestive of PCP pneumonia (12/24/2012) - Intubated on mechanical vent  12/24/2012 - On pressors 12/24/2012 Neo- pt developed bradycardia. Changed to  Phenylephrine.  - Off pressors 12/26/2012 - Weaning trial of MV 12/26/2012 - plan for Extubation  12/26/2012  SUBJECTIVE:  12/26/2012 off pressors this am. And on weaning mechanical ventilation trial. Pt sitting on chair. Alert.   VITAL SIGNS: Temp:  [97.5 F (36.4 C)-98.5 F (36.9 C)] 98.1 F (36.7 C) (02/26 0748) Pulse Rate:  [36-73] 69 (02/26 0831) Resp:  [17-26] 22 (02/26 0831) BP: (78-119)/(42-78) 113/69 mmHg (02/26 0700) SpO2:  [99 %-100 %] 100 % (02/26 0831) FiO2 (%):  [40 %] 40 % (02/26 0831) Weight:  [106 lb 11.2 oz (48.4 kg)] 106 lb 11.2 oz (48.4 kg) (02/26 0445) HEMODYNAMICS: CVP:  [7 mmHg] 7 mmHg VENTILATOR SETTINGS: Vent Mode:  [-] CPAP;PSV FiO2 (%):  [40 %] 40 % Set Rate:  [22 bmp] 22 bmp Vt Set:  [480 mL] 480 mL PEEP:  [5 cmH20] 5 cmH20 Pressure Support:  [5 cmH20] 5 cmH20 Plateau Pressure:  [14 cmH20-18 cmH20] 15 cmH20  INTAKE / OUTPUT: Intake/Output     02/25 0701 - 02/26 0700 02/26 0701 - 02/27 0700   I.V. (mL/kg) 595 (12.3)    NG/GT 1420    IV Piggyback 1105    Total Intake(mL/kg) 3120 (64.5)    Urine (mL/kg/hr) 2815 (2.4)    Total Output 2815     Net +305          Stool Occurrence 1 x    Emesis Occurrence 1 x     PHYSICAL EXAMINATION: General: Sitting up in chair . HEENT: no adenopathies no JVD Heart: Normal S1, S2. No  murmurs, rubs, or gallops appreciated. Lungs: Good air movement bilaterally, without wheezes or crackles. Normal upper airway sounds without evidence of stridor. Abdomen: Abdomen soft, non-tender and not distended, normoactive bowel sounds. Musculoskeletal: No clubbing or synovitis. Skin: No rashes or lesions Neuro: Alert and follows commands. Still intubated.   IMAGING: Dg Chest Port 1 View  12/26/2012  *RADIOLOGY REPORT*  Clinical Data: Assess endotracheal tube.  PORTABLE CHEST - 1 VIEW  Comparison: 12/25/2012.  Findings: Endotracheal tube is in satisfactory position.  Left subclavian central line tip projects over the SVC.   Heart size normal.  There is moderate diffuse bilateral air space disease, unchanged.  No definite pleural fluid.  IMPRESSION: Moderate diffuse bilateral air space disease, suspicious for atypical or fungal infection.  Disseminated tuberculosis is not excluded.   Original Report Authenticated By: Leanna Battles, M.D.    Dg Chest Port 1 View  12/25/2012  *RADIOLOGY REPORT*  Clinical Data: Check endotracheal tube  PORTABLE CHEST - 1 VIEW  Comparison: Chest radiograph 12/24/2012  Findings: Endotracheal tube and left central venous line are unchanged.  Stable cardiac silhouette.  There is some improvement in the diffuse fine bilateral air space disease.  No pneumothorax. No acute osseous abnormality.  IMPRESSION: 1.  Stable support apparatus.  2.  Improvement in diffuse fine air space disease.   Original Report Authenticated By: Genevive Bi, M.D.    ASSESSMENT / PLAN:  PULMONARY  Recent Labs Lab 12/24/12 0039 12/24/12 0054 12/24/12 0454 12/24/12 0525  PHART  --  7.449 7.319*  --   PCO2ART  --  26.0* 28.0*  --   PO2ART  --  64.0* 125.0*  --   HCO3  --  18.0* 14.0*  --   TCO2 20 19 14.8  --   O2SAT  --  94.0 98.9 79.6   A: 1) Acute hypoxemic respiratory failure  2) Pneumonia in severely immunocompromised patient due to HIV. Chest X ray suggestive of PCP pneumonia 3) History of tuberculosis that was treated successfully in the past - Bronchoscopy lavage results so far negative.  P:   - Mechanical ventilation on weaning trial may be able to extubate today.  CARDIOVASCULAR  Recent Labs Lab 12/24/12 0039 12/24/12 0236 12/25/12 1037 12/26/12 0408  LATICACIDVEN 1.08 1.1  --   --   PROCALCITON  --  4.66 3.56 2.57  No results found for this basename: PROBNP,  in the last 168 hours    Recent Labs Lab 12/24/12 0021  TROPONINI <0.30   A:  1) Septic shock >> resolved - 12/20/2012: [Attending physician statement: Levophed changed toe Neo-Synephrine on account of bradycardia] -  12/26/2012: Off pressors since this am. P:  - Monitor vitals - Continue on steroids until tomorrow. Also pending PCP DFA results. - Trend procalcitonin  RENAL   Recent Labs Lab 12/24/12 0039 12/24/12 0232 12/24/12 0500 12/25/12 0410 12/26/12 0408  NA 139  --  136 137 137  K 3.5  --  3.6 4.1 3.3*  CL 107  --  112 111 107  CO2  --   --  16* 18* 21  GLUCOSE 93  --  123* 134* 146*  BUN 17  --  13 15 18   CREATININE 1.20 0.96 0.97 0.80 0.77  CALCIUM  --   --  5.9* 7.3* 7.9*  MG  --   --  1.2* 2.1 1.9  PHOS  --   --  3.0 2.9 2.7    A:   1) hypomagnesemia 2) hypocalcemia 3)  hypokalemia P: - will repleate - trend mg, Phos, bmet  GASTROINTESTINAL  Recent Labs Lab 12/24/12 0232  INR 1.28    A:   1) No issues P:   - GI prophylaxis with protonix - NPO until tomorrow then advance diet as tolerated.  HEMATOLOGIC  Recent Labs Lab 12/24/12 0500 12/25/12 0410 12/26/12 0408  HGB 9.3* 10.4* 9.6*  HCT 27.7* 31.4* 28.0*  WBC 8.4 16.0* 17.4*  PLT 201 238 207    A:   1) Anemia>> stable P:  - No indication for blood transfusion - - PRBC for hgb </= 6.9gm%    - exceptions are   -  if ACS susepcted/confirmed then transfuse for hgb </= 8.0gm%,  or    -  If septic shock first 24h and scvo2 < 70% then transfuse for hgb </= 9.0gm%   - active bleeding with hemodynamic instability, then transfuse regardless of hemoglobin value   At at all times try to transfuse 1 unit prbc as possible with exception of active hemorrhage  INFECTIOUS  Recent Labs Lab 12/24/12 0039 12/24/12 0236 12/25/12 1037 12/26/12 0408  LATICACIDVEN 1.08 1.1  --   --   PROCALCITON  --  4.66 3.56 2.57   A:   1) HIV, viral load from January 11914 and CD4 count > 200. Now Viral load is 67721 and CD4 count is 230 2) Bilateral pneumonia. Results from BAL are negative including cytology.  P:   - ID following. - Antibiotics as above will change to PO after extubation.  ENDOCRINE A:   1) DM   P:    - ICU hyperglycemia protocol with SQ insulin  NEUROLOGIC A:   1) No issues P:   - off sedation for weaning and possible extubation  Signed D. [place] Rolene Arbour, MD Family Medicine  PGY-2  12/26/2012 8:52 AM      STAFF NOTE: I, Dr Lavinia Sharps have personally reviewed patient's available data, including medical history, events of note, physical examination and test results as part of my evaluation. I have discussed with resident/NP and other care providers such as pharmacist, RN and RRT.  In addition,  I personally evaluated patient and elicited key findings of acute resp failure without etiology. Meets extubation criteria and extubated.  Rest per NP/medical resident whose note is outlined above and that I agree with     Dr. Kalman Shan, M.D., [facility]C.P Pulmonary and Critical Care Medicine Staff Physician [facility] [place] [facility] Pager: [phone], If no answer or between  15:00h - 7:00h: call [phone]  12/26/2012 3:11 PM

## 2012-12-27 DIAGNOSIS — B20 Human immunodeficiency virus [HIV] disease: Secondary | ICD-10-CM

## 2012-12-27 LAB — CBC WITH DIFFERENTIAL/PLATELET
Basophils Absolute: 0 10*3/uL (ref 0.0–0.1)
Basophils Relative: 0 % (ref 0–1)
HCT: 27.4 % — ABNORMAL LOW (ref 39.0–52.0)
Hemoglobin: 9.5 g/dL — ABNORMAL LOW (ref 13.0–17.0)
Lymphocytes Relative: 10 % — ABNORMAL LOW (ref 12–46)
MCHC: 34.7 g/dL (ref 30.0–36.0)
Monocytes Absolute: 0.2 10*3/uL (ref 0.1–1.0)
Neutro Abs: 9.3 10*3/uL — ABNORMAL HIGH (ref 1.7–7.7)
Neutrophils Relative %: 89 % — ABNORMAL HIGH (ref 43–77)
RDW: 15.8 % — ABNORMAL HIGH (ref 11.5–15.5)
WBC: 10.5 10*3/uL (ref 4.0–10.5)

## 2012-12-27 LAB — BASIC METABOLIC PANEL
CO2: 27 mEq/L (ref 19–32)
Chloride: 102 mEq/L (ref 96–112)
Creatinine, Ser: 0.8 mg/dL (ref 0.50–1.35)
GFR calc Af Amer: >90 mL/min (ref >90)
Potassium: 4.1 mEq/L (ref 3.5–5.1)

## 2012-12-27 LAB — GLUCOSE, CAPILLARY: Glucose-Capillary: 153 mg/dL — ABNORMAL HIGH (ref 70–99)

## 2012-12-27 LAB — PROCALCITONIN: Procalcitonin: 1.06 ng/mL

## 2012-12-27 LAB — MAGNESIUM: Magnesium: 2 mg/dL (ref 1.5–2.5)

## 2012-12-27 LAB — PHOSPHORUS: Phosphorus: 4.9 mg/dL — ABNORMAL HIGH (ref 2.3–4.6)

## 2012-12-27 MED ORDER — TRAZODONE HCL 50 MG PO TABS
50.0000 mg | ORAL_TABLET | Freq: Every evening | ORAL | Status: DC | PRN
  Administered 2012-12-27: 50 mg via ORAL
  Filled 2012-12-27 (×2): qty 1

## 2012-12-27 MED ORDER — LEVOFLOXACIN 500 MG PO TABS
500.0000 mg | ORAL_TABLET | Freq: Every day | ORAL | Status: DC
  Administered 2012-12-27 – 2012-12-29 (×3): 500 mg via ORAL
  Filled 2012-12-27 (×3): qty 1

## 2012-12-27 MED ORDER — SULFAMETHOXAZOLE-TMP DS 800-160 MG PO TABS
1.5000 | ORAL_TABLET | Freq: Three times a day (TID) | ORAL | Status: DC
  Administered 2012-12-27 (×2): 1.5 via ORAL
  Filled 2012-12-27 (×7): qty 1.5

## 2012-12-27 NOTE — Evaluation (Signed)
Physical Therapy Evaluation Patient Details Name: Eric Jefferson MRN: [id number] DOB: 02-Oct-1961 Today's Date: 12/27/2012 Time: 1136-1207 PT Time Calculation (min): 31 min  PT Assessment / Plan / Recommendation Clinical Impression  pt admitted with fever, cough and progressive SOB; pt has been on a steady decline over last 1-3 months.  Pt showed general weakness and decr activity tolerance.  Pt can benefit from Pt to maximize function.    PT Assessment  Patient needs continued PT services    Follow Up Recommendations  Home health PT;Supervision/Assistance - 24 hour    Does the patient have the potential to tolerate intense rehabilitation      Barriers to Discharge Decreased caregiver support      Equipment Recommendations  Other (comment) (TBA further)    Recommendations for Other Services     Frequency Min 3X/week    Precautions / Restrictions Precautions Precautions: Fall Restrictions Weight Bearing Restrictions: No   Pertinent Vitals/Pain       Mobility  Bed Mobility Bed Mobility: Supine to Sit;Sitting - Scoot to Edge of Bed Supine to Sit: 4: Min assist;HOB flat Sitting - Scoot to [place] of Bed: 4: Min assist Details for Bed Mobility Assistance: uses UE assist approp. Transfers Transfers: Sit to Stand;Stand to Sit Sit to Stand: 4: Min assist;With upper extremity assist;From bed Stand to Sit: 4: Min guard;With upper extremity assist;To chair/3-in-1 Details for Transfer Assistance: safe technique Ambulation/Gait Ambulation/Gait Assistance: 4: Min assist;4: Min guard Ambulation Distance (Feet): 130 Feet Assistive device: 1 person hand held assist;None Ambulation/Gait Assistance Details: generally steady, but slow and deliberate Gait Pattern: Step-through pattern Stairs: No    Exercises     PT Diagnosis: Generalized weakness  PT Problem List: Decreased strength;Decreased activity tolerance;Decreased mobility PT Treatment Interventions: Gait training;Stair  training;Functional mobility training;Therapeutic activities;Therapeutic exercise;Patient/family education   PT Goals Acute Rehab PT Goals PT Goal Formulation: With patient/family Time For Goal Achievement: 01/10/13 Potential to Achieve Goals: Good Pt will go Supine/Side to Sit: with modified independence;with HOB 0 degrees PT Goal: Supine/Side to Sit - Progress: Goal set today Pt will go Sit to Stand: with modified independence PT Goal: Sit to Stand - Progress: Goal set today Pt will Transfer Bed to Chair/Chair to Bed: with modified independence PT Transfer Goal: Bed to Chair/Chair to Bed - Progress: Goal set today Pt will Ambulate: 51 - 150 feet;with supervision PT Goal: Ambulate - Progress: Goal set today Pt will Go Up / Down Stairs: 3-5 stairs;with rail(s);with supervision PT Goal: Up/Down Stairs - Progress: Goal set today  Visit Information  Last PT Received On: 12/27/12 Assistance Needed: +1    Subjective Data  Subjective: This is enoug...go back Patient Stated Goal: Home, able to stay by himself   Prior Functioning  Home Living Lives With: Family;Spouse Available Help at Discharge: Other (Comment);Available PRN/intermittently (wife and family at work or school days) Type of Home: House Home Access: Stairs to enter Secretary/administrator of Steps: 4-5 Entrance Stairs-Rails: Right;Left;Can reach both Home Layout: Two level;Able to live on main level with bedroom/bathroom Alternate Level Stairs-Number of Steps: flight Bathroom Shower/Tub: Tub/shower unit;Curtain Bathroom Toilet: Standard Home Adaptive Equipment: None Prior Function Level of Independence: Needs assistance (sometimes he can do for himself) Able to Take Stairs?: Yes Communication Communication: Prefers language other than English;Other (comment) (son interpreted adequately)    Cognition  Cognition Overall Cognitive Status: Appears within functional limits for tasks  assessed/performed Arousal/Alertness: Awake/alert Orientation Level: Appears intact for tasks assessed Behavior During Session: [facility] for tasks performed  Extremity/Trunk Assessment Right Upper Extremity Assessment RUE ROM/Strength/Tone: WFL for tasks assessed RUE Sensation: WFL - Light Touch RUE Coordination: WFL - gross/fine motor Left Upper Extremity Assessment LUE ROM/Strength/Tone: WFL for tasks assessed LUE Sensation: WFL - Light Touch LUE Coordination: WFL - gross/fine motor Right Lower Extremity Assessment RLE ROM/Strength/Tone: WFL for tasks assessed (bil generally weak but functional) RLE Sensation: WFL - Light Touch RLE Coordination: WFL - gross/fine motor Left Lower Extremity Assessment LLE ROM/Strength/Tone: WFL for tasks assessed LLE Sensation: WFL - Light Touch LLE Coordination: WFL - gross/fine motor Trunk Assessment Trunk Assessment: Normal   Balance Balance Balance Assessed: Yes Static Standing Balance Static Standing - Balance Support: During functional activity;No upper extremity supported Static Standing - Level of Assistance: 5: Stand by assistance;4: Min assist Dynamic Standing Balance Dynamic Standing - Balance Support: During functional activity;No upper extremity supported Dynamic Standing - Level of Assistance: 4: Min assist;5: Stand by assistance  End of Session PT - End of Session Activity Tolerance: Patient tolerated treatment well Patient left: in chair;with family/visitor present;with call bell/phone within reach Nurse Communication: Mobility status  GP     Breyer Tejera, Eliseo Gum 12/27/2012, 12:26 PM 12/27/2012  [place] Bing, PT [phone] (pager)

## 2012-12-27 NOTE — Progress Notes (Signed)
Clinical Social Worker met with pt at bedside.  CSW introduced self, explained role, and provided support.  CSW and pt discussed potential needs for interpreter; pt would like an interpreter.  CSW staffed case with MD.  CSW requested an interpreter, who speaks [place] for 1pm today.   Angelia Mould, MSW, [place] [phone]

## 2012-12-27 NOTE — Progress Notes (Signed)
Hospital Course Mr. Mccowen is a 52 -year-old [place] gentleman with a history of HIV, diabetes, pulmonary tuberculosis (2009), who presented with 2 weeks of cough with green sputum, fever, shortness of breath. He was hypotensive, tachycardic, and hypoxic to 64 on 2 L.Chest x-ray on admission with diffuse interstitial and nodular infiltrates that were initially concerning for PCP. He was admitted to the ICU with respiratory failure, intubated, started on vancomycin, Zosyn, Bactrim, given IV fluids for septic shock. Bronchoscopy with BAL did not show any evidence of PCP or acid-fast bacteria on smear. Respiratory virus panel negative on BAL.  Patient also provides a history of dizziness over the last month as well as diarrhea (2-3 loose stools per day). He states that he is currently nauseated but denies any vomiting. He still has some right sided abdominal pain. He is hungry and asks whether he can have some tube. He denies fever, chills, chest pain.  Objective: Vital signs in last 24 hours: Filed Vitals:   12/27/12 0600 12/27/12 0700 12/27/12 0800 12/27/12 0900  BP: 88/56 90/63 89/51  86/54  Pulse: 51 47 59 46  Temp:   98.2 F (36.8 C)   TempSrc:   Oral   Resp: 18 17 18 16   Height:      Weight:      SpO2: 95% 99% 97% 100%   Weight change: -2 lb 13.9 oz (-1.3 kg)  Intake/Output Summary (Last 24 hours) at 12/27/12 1341 Last data filed at 12/27/12 0900  Gross per 24 hour  Intake   1695 ml  Output   1052 ml  Net    643 ml    Physical Exam Blood pressure 86/54, pulse 46, temperature 98.2 F (36.8 C), temperature source Oral, resp. rate 16, height 5\' 4"  (1.626 m), weight 103 lb 13.4 oz (47.1 kg), SpO2 100.00%. General:  Thin, No acute distress, alert and oriented,  HEENT:  PERRL, EOMI, moist mucous membranes, nasal cannula in place Cardiovascular:  Regular rate and rhythm, no murmurs, rubs or gallops Respiratory:  Clear to auscultation bilaterally, no wheezes, rales, or rhonchi Abdomen:   Soft, nondistended, RLQ tenderness, bowel sounds present, no rebound or guarding Extremities:  Warm and well-perfused, no edema.  Skin: Warm, dry, no rashes Neuro: Not anxious appearing, no depressed mood, normal affect  Lab Results: Basic Metabolic Panel:  Recent Labs Lab 12/26/12 0408 12/27/12 0410  NA 137 136  K 3.3* 4.1  CL 107 102  CO2 21 27  GLUCOSE 146* 114*  BUN 18 15  CREATININE 0.77 0.80  CALCIUM 7.9* 8.3*  MG 1.9 2.0  PHOS 2.7 4.9*   CBC:  Recent Labs Lab 12/26/12 0408 12/27/12 0410  WBC 17.4* 10.5  NEUTROABS 15.0* 9.3*  HGB 9.6* 9.5*  HCT 28.0* 27.4*  MCV 73.1* 72.5*  PLT 207 238   Cardiac Enzymes:  Recent Labs Lab 12/24/12 0021  TROPONINI <0.30   CBG:  Recent Labs Lab 12/26/12 1118 12/26/12 1530 12/26/12 2007 12/27/12 0024 12/27/12 0410 12/27/12 0757  GLUCAP 112* 85 108* 153* 128* 105*   Coagulation:  Recent Labs Lab 12/24/12 0232  LABPROT 15.7*  INR 1.28   Urinalysis:  Recent Labs Lab 12/24/12 0507  COLORURINE STRAW*  LABSPEC 1.007  PHURINE 6.0  GLUCOSEU NEGATIVE  HGBUR NEGATIVE  BILIRUBINUR NEGATIVE  KETONESUR NEGATIVE  PROTEINUR NEGATIVE  UROBILINOGEN 0.2  NITRITE NEGATIVE  LEUKOCYTESUR NEGATIVE     Micro Results:  Studies/Results: Dg Chest Port 1 View  12/26/2012  *RADIOLOGY REPORT*  Clinical Data: Assess  endotracheal tube.  PORTABLE CHEST - 1 VIEW  Comparison: 12/25/2012.  Findings: Endotracheal tube is in satisfactory position.  Left subclavian central line tip projects over the SVC.  Heart size normal.  There is moderate diffuse bilateral air space disease, unchanged.  No definite pleural fluid.  IMPRESSION: Moderate diffuse bilateral air space disease, suspicious for atypical or fungal infection.  Disseminated tuberculosis is not excluded.   Original Report Authenticated By: Leanna Battles, M.D.    Medications:  Medications reviewed  Scheduled Meds: . antiseptic oral rinse  15 mL Mouth Rinse QID  .  chlorhexidine  15 mL Mouth Rinse BID  . elvitegravir-cobicistat-emtricitabine-tenofovir  1 tablet Oral Q breakfast  . heparin  5,000 Units Subcutaneous Q8H  . levofloxacin  500 mg Oral Daily  . predniSONE  40 mg Oral BID WC   Followed by  . [START ON 12/31/2012] predniSONE  40 mg Oral Q breakfast   Followed by  . [START ON 01/05/2013] predniSONE  20 mg Oral Q breakfast  . sulfamethoxazole-trimethoprim  1.5 tablet Oral Q8H   Continuous Infusions:  PRN Meds:.sodium chloride, midazolam  Assessment/Plan:  Acute Hypoxic Respiratory Failure Secondary to Pneumonia: Resolving Patient presented with shortness of breath with hypoxia in septic shock, was intubated and admitted to the ICU. CXR with bilateral interstitial/airspace nodular opacities. Started on vanc, zosyn, and bactrim as well as prednisone for PNA BAL on 12/24/2012 negative for respiratory virus panel, PCP, Legionella, AFB smear, cultures negative so far. Extubated to 12/26/2012 without difficulty. Currently on Levaquin, Bactrim, prednisone covering for community acquired pneumonia and PCP Patient's baseline BP is 100/60s and he is close to that currently.  -transfer out of ICU into step down -Continue oral Prednisone taper -cont levaquin, bactrim -wean O2 as tolerated -up and out of bed if possible  HIV Diagnosed in 2009 with active pulmonary TB, treated with full course. Last viral load was 67K. CD4 count 230 on 12/24/12. Unclear compliance with ART, has had trouble obtaining home Stribild recently. Poorly controlled HIV.   -Continue STRIBILD -ID following, appreciate rec  Abdominal Pain and loose stools Patient has had loose stools 2-3 per day for the past month. May be HIV diarrhea given poorly controlled HIV. Less likely cryptosporidium since not acute in nature. Will monitor for now -check Cdiff if worsens  Diet Carb modified  DVT PPx -Heparin  Dispo -PT consult -Patient lives at home with his wife -anticipate dc  home in 2-3 days     LOS: 4 days   [place] Ar 12/27/2012, 1:41 PM

## 2012-12-27 NOTE — Progress Notes (Signed)
PULMONARY  / CRITICAL CARE MEDICINE  Name: Eric Jefferson MRN: [id number] DOB: 1961-02-16    ADMISSION DATE:  12/23/2012  BRIEF PATIENT DESCRIPTION: 52 years old [place] man with HIV on antiretroviral therapy, DM and history of pulmonary TB, presents with 2 weeks of SOB and fever. History limited due to poor english speaking. History taken through a family member. Patient unable to speak due to severe respiratory distress. Apparently he has been deteriorating over the last few months but two weeks ago started having fever, productive cough and progressive SOB. At admission hypotensive, tachycardic and hypoxic with PO2 of 64 on 2 L/min via [place]. In severe respiratory distress with RR of 38 to 40. Last viral load 56998 on 11/14/12.  LINES / TUBES: - Left subclavian CVC (12/24/12) - Left IJ CVC (12/24/12) - Foley (12/24/12)>>12/26/12  CULTURES:  Recent Labs Lab 12/24/12 0039  12/24/12 0236 12/25/12 1037 12/26/12 0408 12/27/12 0410  LATICACIDVEN 1.08  --  1.1  --   --   --   PROCALCITON  --   < > 4.66 3.56 2.57 1.06  < > = values in this interval not displayed. - MRSA PCR 12/24/2012 > NEG - Blood (12/24/12)>> negative - Urine (12/24/12)>> negative - Sputum (12/24/12)>> reincubated - Bronchoalveolar lavage 12/24/2012  - Virus PCR >> negative  - Pneumocystis and Legionella >> PCP negative, Legionella negative  - AFB smear and culture >> negative  - Fungal smear and culture >> negative  - Bacterial Gram stain and culture >> negative             - Cytology >> negative for malignancy. - Cripto antigen(12/26/2012)>>negative - PCP DFA(12/26/2012)>> negative  ANTIBIOTICS: 2/24 Ceftriaxone>> 2/24 2/24 Azythromicin>> 2/24 2/24 Vancomycin >> 2/24 2/24 Zosyn>> 2/24 2/24 Tamiflu>> 2/26 2/24 Levofloxacin>> 2/24 Bactrim >> 2/26 Stribild>>  SIGNIFICANT EVENTS / STUDIES:  - Chest X ray with bilateral diffuse interstitial and nodular infiltrates very suggestive of PCP pneumonia (12/24/2012) - Intubated  on mechanical vent  12/24/2012 - On pressors 12/24/2012 Neo- pt developed bradycardia. Changed to Phenylephrine.  - Off pressors 12/26/2012 - Weaning trial of MV 12/26/2012 - Extubated 12/26/2012  SUBJECTIVE:  12/27/2012 Tolerated well extubation procedure.    VITAL SIGNS: Temp:  [97.8 F (36.6 C)-98.1 F (36.7 C)] 97.9 F (36.6 C) (02/27 0411) Pulse Rate:  [47-89] 51 (02/27 0600) Resp:  [15-24] 18 (02/27 0600) BP: (76-113)/(38-69) 88/56 mmHg (02/27 0600) SpO2:  [94 %-100 %] 95 % (02/27 0600) FiO2 (%):  [40 %] 40 % (02/26 0831) Weight:  [103 lb 13.4 oz (47.1 kg)] 103 lb 13.4 oz (47.1 kg) (02/27 0500) HEMODYNAMICS:   VENTILATOR SETTINGS: Vent Mode:  [-] CPAP;PSV FiO2 (%):  [40 %] 40 % PEEP:  [5 cmH20] 5 cmH20 Pressure Support:  [5 cmH20] 5 cmH20  INTAKE / OUTPUT: Intake/Output     02/26 0701 - 02/27 0700   I.V. (mL/kg) 430 (9.1)   NG/GT 260   IV Piggyback 1495   Total Intake(mL/kg) 2185 (46.4)   Urine (mL/kg/hr) 2250 (2)   Total Output 2250   Net -65        PHYSICAL EXAMINATION: General: Alert, HEENT: no adenopathies no JVD Heart: Normal S1, S2. No murmurs, rubs, or gallops appreciated. Lungs: diminished breath sounds bilaterally, no rales or wheezes. Abdomen: Abdomen soft, non-tender and not distended, normoactive bowel sounds. Musculoskeletal: No clubbing or synovitis. Skin: No rashes or lesions Neuro: Alert and follows commands. Still intubated.  IMAGING: Dg Chest Port 1 View  12/26/2012  *RADIOLOGY REPORT*  Clinical Data: Assess endotracheal tube.  PORTABLE CHEST - 1 VIEW  Comparison: 12/25/2012.  Findings: Endotracheal tube is in satisfactory position.  Left subclavian central line tip projects over the SVC.  Heart size normal.  There is moderate diffuse bilateral air space disease, unchanged.  No definite pleural fluid.  IMPRESSION: Moderate diffuse bilateral air space disease, suspicious for atypical or fungal infection.  Disseminated tuberculosis is not excluded.    Original Report Authenticated By: Leanna Battles, M.D.    ASSESSMENT / PLAN:  PULMONARY  Recent Labs Lab 12/24/12 0039 12/24/12 0054 12/24/12 0454 12/24/12 0525  PHART  --  7.449 7.319*  --   PCO2ART  --  26.0* 28.0*  --   PO2ART  --  64.0* 125.0*  --   HCO3  --  18.0* 14.0*  --   TCO2 20 19 14.8  --   O2SAT  --  94.0 98.9 79.6   A: 1) Acute hypoxemic respiratory failure  2) Pneumonia in severely immunocompromised patient due to HIV. Chest X ray suggestive of PCP pneumonia 3) History of tuberculosis that was treated successfully in the past - Bronchoscopy lavage results are negative  P:   - tolerated extubation well.   CARDIOVASCULAR  Recent Labs Lab 12/24/12 0039  12/24/12 0236 12/25/12 1037 12/26/12 0408 12/27/12 0410  LATICACIDVEN 1.08  --  1.1  --   --   --   PROCALCITON  --   < > 4.66 3.56 2.57 1.06  < > = values in this interval not displayed.No results found for this basename: PROBNP,  in the last 168 hours    Recent Labs Lab 12/24/12 0021  TROPONINI <0.30   A:  1) Septic shock >> resolved   P: - Monitor vitals - Continue Prednisone taper.   RENAL   Recent Labs Lab 12/24/12 0039 12/24/12 0232 12/24/12 0500 12/25/12 0410 12/26/12 0408 12/27/12 0410  NA 139  --  136 137 137 136  K 3.5  --  3.6 4.1 3.3* 4.1  CL 107  --  112 111 107 102  CO2  --   --  16* 18* 21 27  GLUCOSE 93  --  123* 134* 146* 114*  BUN 17  --  13 15 18 15   CREATININE 1.20 0.96 0.97 0.80 0.77 0.80  CALCIUM  --   --  5.9* 7.3* 7.9* 8.3*  MG  --   --  1.2* 2.1 1.9 2.0  PHOS  --   --  3.0 2.9 2.7 4.9*    A:   electrolites wnl P: - Monitor  GASTROINTESTINAL  Recent Labs Lab 12/24/12 0232  INR 1.28   A:   1) No issues P:   - GI prophylaxis with protonix - Advance diet as tolerated.  HEMATOLOGIC  Recent Labs Lab 12/25/12 0410 12/26/12 0408 12/27/12 0410  HGB 10.4* 9.6* 9.5*  HCT 31.4* 28.0* 27.4*  WBC 16.0* 17.4* 10.5  PLT 238 207 238   A:    1) Anemia>> stable P:  - No indication for blood transfusion  INFECTIOUS  Recent Labs Lab 12/24/12 0039  12/24/12 0236 12/25/12 1037 12/26/12 0408 12/27/12 0410  LATICACIDVEN 1.08  --  1.1  --   --   --   PROCALCITON  --   < > 4.66 3.56 2.57 1.06  < > = values in this interval not displayed. A:   1) HIV, viral load from January 45409 and CD4 count > 200. Now Viral load is 67721 and CD4  count is 230 2) Bilateral pneumonia. Results from BAL are negative including cytology.  P:   - ID following. - Antibiotics as above   ENDOCRINE A:   1) DM   P:   - SSI - CHO diet  NEUROLOGIC A:   1) No issues P:   - monitor  Signed D. Piloto Rolene Arbour, MD Family Medicine  PGY-2  12/27/2012 6:41 AM     I have interviewed and examined the patient and reviewed the database. I have formulated the assessment and plan as reflected in the note above with amendments made by me.  Transfer out of ICU and to [facility] 2/28. PCCM will sign off as of 2/28 AM. Please call if we can be of further asisstance  Billy Fischer, MD;  PCCM service; Mobile [phone]

## 2012-12-27 NOTE — Progress Notes (Signed)
Patient ID: Eric Jefferson, male   DOB: 1960/12/27, 52 y.o.   MRN: [id number]          [facility] for Infectious Disease    Date of Admission:  12/23/2012           Day 5 levofloxacin        Day 5 trimethoprim sulfamethoxazole Active Problems:   PULMONARY TUBERCULOSIS   HIV DISEASE   TOBACCO ABUSE   DENTAL CARIES   GERD   Bilateral pulmonary infiltrates on CXR   Loss of weight   . antiseptic oral rinse  15 mL Mouth Rinse QID  . chlorhexidine  15 mL Mouth Rinse BID  . elvitegravir-cobicistat-emtricitabine-tenofovir  1 tablet Oral Q breakfast  . heparin  5,000 Units Subcutaneous Q8H  . levofloxacin (LEVAQUIN) IV  500 mg Intravenous Q24H  . predniSONE  40 mg Oral BID WC   Followed by  . [START ON 12/31/2012] predniSONE  40 mg Oral Q breakfast   Followed by  . [START ON 01/05/2013] predniSONE  20 mg Oral Q breakfast  . sulfamethoxazole-trimethoprim  1.5 tablet Oral Q8H    Subjective: He is feeling much better with less cough and shortness of breath. His nurse reports that he walked around the unit this morning.  Objective: Temp:  [97.9 F (36.6 C)-98.2 F (36.8 C)] 98.2 F (36.8 C) (02/27 0800) Pulse Rate:  [46-75] 46 (02/27 0900) Resp:  [15-22] 16 (02/27 0900) BP: (80-99)/(47-63) 86/54 mmHg (02/27 0900) SpO2:  [94 %-100 %] 100 % (02/27 0900) Weight:  [47.1 kg (103 lb 13.4 oz)] 47.1 kg (103 lb 13.4 oz) (02/27 0500)  General: Extubated, smiling and sitting up in a chair Skin: No rash Lungs: Clear Cor: Regular S1 and S2 no murmurs  Lab Results Lab Results  Component Value Date   WBC 10.5 12/27/2012   HGB 9.5* 12/27/2012   HCT 27.4* 12/27/2012   MCV 72.5* 12/27/2012   PLT 238 12/27/2012    Lab Results  Component Value Date   CREATININE 0.80 12/27/2012   BUN 15 12/27/2012   NA 136 12/27/2012   K 4.1 12/27/2012   CL 102 12/27/2012   CO2 27 12/27/2012    Lab Results  Component Value Date   ALT 15 11/14/2012   AST 26 11/14/2012   ALKPHOS 93 11/14/2012   BILITOT 0.3 11/14/2012       HIV 1 RNA Quant (copies/mL)  Date Value  12/24/2012 67721*  11/14/2012 36644*  06/20/2012 03474*     CD4 T Cell Abs (cmm)  Date Value  12/05/2012 230*  11/14/2012 230*  06/20/2012 250*   Studies/Results: Dg Chest Port 1 View  12/26/2012  *RADIOLOGY REPORT*  Clinical Data: Assess endotracheal tube.  PORTABLE CHEST - 1 VIEW  Comparison: 12/25/2012.  Findings: Endotracheal tube is in satisfactory position.  Left subclavian central line tip projects over the SVC.  Heart size normal.  There is moderate diffuse bilateral air space disease, unchanged.  No definite pleural fluid.  IMPRESSION: Moderate diffuse bilateral air space disease, suspicious for atypical or fungal infection.  Disseminated tuberculosis is not excluded.   Original Report Authenticated By: Leanna Battles, M.D.     Assessment: So far the cause of his subacute pneumonia mains unclear but he has certainly improved with empiric therapy targeting community-acquired pneumonia pathogens and pneumocystis. Unfortunately, his original order for repeat CD4 count was canceled. I reordered this morning. He had not been able to get his Stribild and as a result his HIV infection remains  poorly controlled. CD 4 count may have fallen significantly putting him at risk for pneumocystis and other opportunistic pathogens.  Plan: 1. Continue oral trimethoprim sulfamethoxazole and steroids 2. Change levofloxacin to oral form  Cliffton Asters, MD [facility] for Infectious Disease [facility] [phone] pager   [phone] cell 12/27/2012, 2:06 PM

## 2012-12-28 ENCOUNTER — Encounter (HOSPITAL_COMMUNITY): Payer: Self-pay

## 2012-12-28 LAB — GLUCOSE, CAPILLARY

## 2012-12-28 LAB — T-HELPER CELLS (CD4) COUNT (NOT AT ARMC)
CD4 % Helper T Cell: 10 % — ABNORMAL LOW (ref 33–55)
CD4 T Cell Abs: 90 uL — ABNORMAL LOW (ref 400–2700)

## 2012-12-28 LAB — BASIC METABOLIC PANEL
BUN: 24 mg/dL — ABNORMAL HIGH (ref 6–23)
Chloride: 104 mEq/L (ref 96–112)
Glucose, Bld: 130 mg/dL — ABNORMAL HIGH (ref 70–99)
Potassium: 4.6 mEq/L (ref 3.5–5.1)

## 2012-12-28 LAB — CBC
HCT: 27.3 % — ABNORMAL LOW (ref 39.0–52.0)
Hemoglobin: 9.5 g/dL — ABNORMAL LOW (ref 13.0–17.0)
MCH: 25.3 pg — ABNORMAL LOW (ref 26.0–34.0)
MCHC: 34.8 g/dL (ref 30.0–36.0)
RBC: 3.75 MIL/uL — ABNORMAL LOW (ref 4.22–5.81)

## 2012-12-28 LAB — HEMOGLOBIN A1C: Mean Plasma Glucose: 128 mg/dL — ABNORMAL HIGH (ref <117)

## 2012-12-28 MED ORDER — AZITHROMYCIN 600 MG PO TABS
1200.0000 mg | ORAL_TABLET | ORAL | Status: DC
  Administered 2012-12-28: 1200 mg via ORAL
  Filled 2012-12-28 (×2): qty 2

## 2012-12-28 MED ORDER — ONDANSETRON HCL 4 MG/2ML IJ SOLN
4.0000 mg | Freq: Four times a day (QID) | INTRAMUSCULAR | Status: DC | PRN
  Administered 2012-12-28: 4 mg via INTRAVENOUS
  Filled 2012-12-28 (×2): qty 2

## 2012-12-28 MED ORDER — ENSURE COMPLETE PO LIQD
237.0000 mL | Freq: Three times a day (TID) | ORAL | Status: DC
  Administered 2012-12-28 – 2012-12-29 (×5): 237 mL via ORAL

## 2012-12-28 MED ORDER — INSULIN ASPART 100 UNIT/ML ~~LOC~~ SOLN: 0.0000 [IU] | Freq: Three times a day (TID) | SUBCUTANEOUS | Status: DC

## 2012-12-28 MED ORDER — SULFAMETHOXAZOLE-TRIMETHOPRIM 400-80 MG PO TABS
3.0000 | ORAL_TABLET | Freq: Three times a day (TID) | ORAL | Status: DC
  Administered 2012-12-28 – 2012-12-29 (×5): 3 via ORAL
  Filled 2012-12-28 (×7): qty 3

## 2012-12-28 NOTE — Progress Notes (Signed)
Pt transferred to room 2020 via w/c. Vss. No c/o pain. Wife at bedside. Report to rn

## 2012-12-28 NOTE — Progress Notes (Signed)
Internal Medicine Teaching Service Attending Note Date: 12/28/2012  Patient name: Eric Jefferson  Medical record number: [id number]  Date of birth: [birth year]    This patient has been seen and discussed with the house staff. Please see their note for complete details. I concur with their findings with the following additions/corrections: Mr Bunte will be tranferred out of ICU today up to the floors. His antibiotics have been changed to PO. Patient walked around in the ICU without much shortness of breath and over all looks better. He still complains of DIZZINESS when he is walking. BP has been on the lower side. If his orthostatics does not improve, we may consider adding a mineralocorticoid to assist with BP and improve dizziness.   Lars Mage 12/28/2012, 10:41 AM

## 2012-12-28 NOTE — Progress Notes (Signed)
Inpatient Diabetes Program Recommendations  AACE/ADA: New Consensus Statement on Inpatient Glycemic Control (2013)  Target Ranges:  Prepandial:   less than 140 mg/dL      Peak postprandial:   less than 180 mg/dL (1-2 hours)      Critically ill patients:  140 - 180 mg/dL   History of diabetes Type 2 per H&P note written by Dr. Marchelle Gearing.  Noted Novolog SSI d/c'd on 02/26.  Inpatient Diabetes Program Recommendations Correction (SSI): Please check CBGs tid ac + HS and cover with Novolog Sensitive correction scale if elevated.  Note: Will follow. Ambrose Finland RN, MSN, CDE Diabetes Coordinator Inpatient Diabetes Program [phone]

## 2012-12-28 NOTE — Progress Notes (Signed)
Subjective  Eric Jefferson is doing well today, feels his breathing is stable and getting better. He did walk around this morning and complains of dizziness. Improved SOB.  Denies fever, chills, chest pain.  Objective: Vital signs in last 24 hours: Filed Vitals:   12/28/12 0900 12/28/12 1000 12/28/12 1100 12/28/12 1200  BP: 88/50 82/46 91/66  96/64  Pulse: 81 69 58 56  Temp:      TempSrc:      Resp: 19 18 19 16   Height:      Weight:      SpO2: 99% 97% 100% 99%   Weight change:   Intake/Output Summary (Last 24 hours) at 12/28/12 1221 Last data filed at 12/28/12 0800  Gross per 24 hour  Intake    200 ml  Output      0 ml  Net    200 ml    Physical Exam Blood pressure 96/64, pulse 56, temperature 97.5 F (36.4 C), temperature source Oral, resp. rate 16, height 5\' 4"  (1.626 m), weight 103 lb 13.4 oz (47.1 kg), SpO2 99.00%. General:  Thin, No acute distress, alert and oriented,  HEENT:  PERRL, EOMI, moist mucous membranes, nasal cannula in place Cardiovascular:  Regular rate and rhythm, no murmurs, rubs or gallops Respiratory:  Clear to auscultation bilaterally, no wheezes, rales, or rhonchi Abdomen:  Soft, nondistended, RLQ tenderness, bowel sounds present, no rebound or guarding Extremities:  Warm and well-perfused, no edema.  Skin: Warm, dry, no rashes Neuro: Not anxious appearing, no depressed mood, normal affect  Lab Results: Basic Metabolic Panel:  Recent Labs Lab 12/26/12 0408 12/27/12 0410 12/28/12 0427  NA 137 136 136  K 3.3* 4.1 4.6  CL 107 102 104  CO2 21 27 25   GLUCOSE 146* 114* 130*  BUN 18 15 24*  CREATININE 0.77 0.80 0.92  CALCIUM 7.9* 8.3* 8.3*  MG 1.9 2.0  --   PHOS 2.7 4.9*  --    CBC:  Recent Labs Lab 12/26/12 0408 12/27/12 0410 12/28/12 0427  WBC 17.4* 10.5 9.5  NEUTROABS 15.0* 9.3*  --   HGB 9.6* 9.5* 9.5*  HCT 28.0* 27.4* 27.3*  MCV 73.1* 72.5* 72.8*  PLT 207 238 260   Cardiac Enzymes:  Recent Labs Lab 12/24/12 0021  TROPONINI  <0.30   CBG:  Recent Labs Lab 12/26/12 1530 12/26/12 2007 12/27/12 0024 12/27/12 0410 12/27/12 0757 12/27/12 1534  GLUCAP 85 108* 153* 128* 105* 209*   Coagulation:  Recent Labs Lab 12/24/12 0232  LABPROT 15.7*  INR 1.28   Urinalysis:  Recent Labs Lab 12/24/12 0507  COLORURINE STRAW*  LABSPEC 1.007  PHURINE 6.0  GLUCOSEU NEGATIVE  HGBUR NEGATIVE  BILIRUBINUR NEGATIVE  KETONESUR NEGATIVE  PROTEINUR NEGATIVE  UROBILINOGEN 0.2  NITRITE NEGATIVE  LEUKOCYTESUR NEGATIVE       Studies/Results: No results found. Medications:  Medications reviewed  Scheduled Meds: . antiseptic oral rinse  15 mL Mouth Rinse QID  . elvitegravir-cobicistat-emtricitabine-tenofovir  1 tablet Oral Q breakfast  . feeding supplement  237 mL Oral TID BM  . heparin  5,000 Units Subcutaneous Q8H  . levofloxacin  500 mg Oral Daily  . predniSONE  40 mg Oral BID WC   Followed by  . [START ON 12/31/2012] predniSONE  40 mg Oral Q breakfast   Followed by  . [START ON 01/05/2013] predniSONE  20 mg Oral Q breakfast  . sulfamethoxazole-trimethoprim  3 tablet Oral Q8H   Continuous Infusions:  PRN Meds:.sodium chloride, ondansetron, traZODone  Assessment/Plan:  Acute Hypoxic Respiratory Failure Secondary to Pneumonia: Resolving Patient presented with shortness of breath with hypoxia in septic shock, was intubated and admitted to the ICU. CXR with bilateral interstitial/airspace nodular opacities. Started on vanc, zosyn, and bactrim as well as prednisone for PNA BAL on 12/24/2012 negative for respiratory virus panel, PCP, Legionella, AFB smear, cultures negative so far. Extubated to 12/26/2012 without difficulty. Currently on Levaquin, Bactrim, prednisone covering for community acquired pneumonia and PCP. Patient's baseline BP is 100/60s and he is close to that currently.  2/28: on 2L [place]  -telemetry transfer -Continue oral Prednisone taper -cont levaquin, bactrim -wean O2 as tolerated -up and  out of bed, walking around as tolerated  HIV Diagnosed in 2009 with active pulmonary TB, treated with full course. Last viral load was 67K. CD4 count 230 on 12/24/12. Unclear compliance with ART, has had trouble obtaining home Stribild recently. Poorly controlled HIV.   -Continue STRIBILD -ID following, appreciate recs  Dizziness Patient has baseline low blood pressure and likely has orthostatic hypotension. C/o dizziness with activity and standing up from sitting. Will consider adding mineralocorticoid in future.  Abdominal Pain and loose stools Patient has had loose stools 2-3 per day for the past month. May be HIV diarrhea given poorly controlled HIV. Less likely cryptosporidium since not acute in nature. Will monitor for now -check Cdiff if worsens  Malnutrition/Diet Carb modified diet with ensure supplementation  DVT PPx -Heparin  Dispo -PT consult -Patient lives at home with his wife -anticipate dc home in 2-3 days     LOS: 5 days   [place] Ar 12/28/2012, 12:21 PM

## 2012-12-28 NOTE — Progress Notes (Signed)
Patient ID: Eric Jefferson, male   DOB: May 04, 1961, 52 y.o.   MRN: [id number]         [facility] for Infectious Disease    Date of Admission:  12/23/2012           Day 6 levofloxacin        Day  6 trimethoprim sulfamethoxazole  Principal Problem:   Bilateral pulmonary infiltrates on CXR Active Problems:   PULMONARY TUBERCULOSIS   HIV DISEASE   TOBACCO ABUSE   DENTAL CARIES   GERD   Loss of weight   . antiseptic oral rinse  15 mL Mouth Rinse QID  . elvitegravir-cobicistat-emtricitabine-tenofovir  1 tablet Oral Q breakfast  . feeding supplement  237 mL Oral TID BM  . heparin  5,000 Units Subcutaneous Q8H  . insulin aspart  0-9 Units Subcutaneous TID WC  . levofloxacin  500 mg Oral Daily  . predniSONE  40 mg Oral BID WC   Followed by  . [START ON 12/31/2012] predniSONE  40 mg Oral Q breakfast   Followed by  . [START ON 01/05/2013] predniSONE  20 mg Oral Q breakfast  . sulfamethoxazole-trimethoprim  3 tablet Oral Q8H    Subjective: He is feeling much better with less shortness of breath and cough. He says that he was not on any medications other than an occasional aspirin before admission.  Objective: Temp:  [97.5 F (36.4 C)-98.5 F (36.9 C)] 97.8 F (36.6 C) (02/28 1200) Pulse Rate:  [44-81] 79 (02/28 1300) Resp:  [13-21] 18 (02/28 1300) BP: (79-106)/(46-75) 92/52 mmHg (02/28 1300) SpO2:  [97 %-100 %] 99 % (02/28 1300)  General: Smiling and comfortable sitting up in a chair Skin: No rash Lungs: Clear Cor: Regular S1 and S2 no murmurs  Lab Results Lab Results  Component Value Date   WBC 9.5 12/28/2012   HGB 9.5* 12/28/2012   HCT 27.3* 12/28/2012   MCV 72.8* 12/28/2012   PLT 260 12/28/2012    Lab Results  Component Value Date   CREATININE 0.92 12/28/2012   BUN 24* 12/28/2012   NA 136 12/28/2012   K 4.6 12/28/2012   CL 104 12/28/2012   CO2 25 12/28/2012    Lab Results  Component Value Date   ALT 15 11/14/2012   AST 26 11/14/2012   ALKPHOS 93 11/14/2012   BILITOT 0.3  11/14/2012    HIV 1 RNA Quant (copies/mL)  Date Value  12/24/2012 67721*  11/14/2012 14782*  06/20/2012 95621*     CD4 T Cell Abs (cmm)  Date Value  12/27/2012 90*  12/05/2012 230*  11/14/2012 230*  Assessment: He is improving on empiric therapy for community-acquired pneumonia and possible pneumocystis pneumonia. Although his pneumocystis antigen was negative, his CD4 count has fallen below 100 putting him at risk. Since he is improving on current therapy I would continue treatment for possible pneumocystis for 3 weeks total. Levofloxacin has some activity against mycobacteria sputum AFB cultures are negative at 3 weeks and 4 days respectively. I would continue levofloxacin for a total of 14 days.   Plan: 1. Continue trimethoprim sulfamethoxazole for 15 more days then switch to one double strength tablet daily 2. Continue levofloxacin for 8 more days 3. Continue Stribild 4. Start azithromycin 1200 mg weekly 5. I will arrange followup in her clinic within the next 2 weeks 6. Please call Dr. Daiva Eves [phone]) for any infectious disease questions this weekend  Cliffton Asters, MD [facility] for Infectious Disease [facility] [phone]  pager   [phone] cell 12/28/2012, 1:32 PM

## 2012-12-28 NOTE — Progress Notes (Signed)
NUTRITION FOLLOW UP  DOCUMENTATION CODES  Per approved criteria  -Underweight -Severe malnutrition in the context of acute illness  Intervention:    Ensure Complete po TID, each supplement provides 350 kcal and 13 grams of protein.  Nutrition Dx:   Inadequate oral intake related to poor appetite as evidenced by poor intake of meals.  Ongoing.  Goal:   Intake to meet >90% of estimated nutrition needs, unmet.  Monitor:   PO intake, labs, weight trend.  Assessment:   Patient was extubated 2/26.  Awaiting telemetry bed for transfer.  Patient is underweight with BMI=17.8.  Intake of meals is poor.  Patient agreed to try PO supplements to help maximize oral intake.  Patient reports that his usual intake is good sometimes and poor sometimes.    Nutrition Focused Physical Exam:  Subcutaneous Fat:  Orbital Region: mild-moderate depletion Upper Arm Region: NA Thoracic and Lumbar Region: NA  Muscle:  Temple Region: mild-moderate depletion Clavicle Bone Region: severe depletion Clavicle and Acromion Bone Region: NA Scapular Bone Region: NA Dorsal Hand: NA Patellar Region: NA Anterior Thigh Region: NA Posterior Calf Region: NA  Edema: none  Pt meets criteria for severe MALNUTRITION in the context of acute illness as evidenced by 6% weight loss in less than 1 week, moderate subcutaneous fat loss, and moderate-severe muscle loss.   Height: Ht Readings from Last 1 Encounters:  12/24/12 5\' 4"  (1.626 m)    Weight Status:   Wt Readings from Last 1 Encounters:  12/27/12 103 lb 13.4 oz (47.1 kg)  12/24/12  110 lb 3.7 oz (50 kg)   Body mass index is 17.81 kg/(m^2). underweight  Re-estimated needs:  Kcal: 1600-1800 Protein: 80-100 gm Fluid: 1.6-1.8 L  Skin: no problems  Diet Order: Carb Control   Intake/Output Summary (Last 24 hours) at 12/28/12 1015 Last data filed at 12/28/12 0800  Gross per 24 hour  Intake    220 ml  Output    400 ml  Net   -180 ml    Last BM:  2/27   Labs:   Recent Labs Lab 12/25/12 0410 12/26/12 0408 12/27/12 0410 12/28/12 0427  NA 137 137 136 136  K 4.1 3.3* 4.1 4.6  CL 111 107 102 104  CO2 18* 21 27 25   BUN 15 18 15  24*  CREATININE 0.80 0.77 0.80 0.92  CALCIUM 7.3* 7.9* 8.3* 8.3*  MG 2.1 1.9 2.0  --   PHOS 2.9 2.7 4.9*  --   GLUCOSE 134* 146* 114* 130*    CBG (last 3)   Recent Labs  12/27/12 0410 12/27/12 0757 12/27/12 1534  GLUCAP 128* 105* 209*    Scheduled Meds: . antiseptic oral rinse  15 mL Mouth Rinse QID  . elvitegravir-cobicistat-emtricitabine-tenofovir  1 tablet Oral Q breakfast  . heparin  5,000 Units Subcutaneous Q8H  . levofloxacin  500 mg Oral Daily  . predniSONE  40 mg Oral BID WC   Followed by  . [START ON 12/31/2012] predniSONE  40 mg Oral Q breakfast   Followed by  . [START ON 01/05/2013] predniSONE  20 mg Oral Q breakfast  . sulfamethoxazole-trimethoprim  3 tablet Oral Q8H    Continuous Infusions:    Joaquin Courts, RD, LDN, CNSC Pager# [phone] After Hours Pager# [phone]

## 2012-12-28 NOTE — Progress Notes (Signed)
PULMONARY  / CRITICAL CARE MEDICINE  Name: Eric Jefferson MRN: [id number] DOB: [birth year]    ADMISSION DATE:  12/23/2012  BRIEF PATIENT DESCRIPTION: 52 years old viet[place] man with HIV on antiretroviral therapy, DM and history of pulmonary TB, presents with 2 weeks of SOB and fever. History limited due to poor english speaking. History taken through a family member. Patient unable to speak due to severe respiratory distress. Apparently he has been deteriorating over the last few months but two weeks ago started having fever, productive cough and progressive SOB. At admission hypotensive, tachycardic and hypoxic with PO2 of 64 on 2 L/min via [place]. In severe respiratory distress with RR of 38 to 40. Last viral load 56998 on 11/14/12.  LINES / TUBES: - Left subclavian CVC (12/24/12) - Left IJ CVC (12/24/12) - Foley (12/24/12)>>12/26/12  CULTURES:  Recent Labs Lab 12/24/12 0039  12/24/12 0236 12/25/12 1037 12/26/12 0408 12/27/12 0410  LATICACIDVEN 1.08  --  1.1  --   --   --   PROCALCITON  --   < > 4.66 3.56 2.57 1.06  < > = values in this interval not displayed. - MRSA PCR 12/24/2012 > NEG - Blood (12/24/12)>> negative - Urine (12/24/12)>> negative - Sputum (12/24/12)>> reincubated - Bronchoalveolar lavage 12/24/2012  - Virus PCR >> negative  - Pneumocystis and Legionella >> PCP negative, Legionella negative  - AFB smear and culture >> negative  - Fungal smear and culture >> negative  - Bacterial Gram stain and culture >> negative             - Cytology >> negative for malignancy. - Cripto antigen(12/26/2012)>>negative - PCP DFA(12/26/2012)>> negative  ANTIBIOTICS: 2/24 Ceftriaxone>> 2/24 2/24 Azythromicin>> 2/24 2/24 Vancomycin >> 2/24 2/24 Zosyn>> 2/24 2/24 Tamiflu>> 2/26 2/24 Levofloxacin>> 2/24 Bactrim >> 2/26 Stribild>>  SIGNIFICANT EVENTS / STUDIES:  - Chest X ray with bilateral diffuse interstitial and nodular infiltrates very suggestive of PCP pneumonia (12/24/2012) - Intubated  on mechanical vent  12/24/2012 - On pressors 12/24/2012 Neo- pt developed bradycardia. Changed to Phenylephrine.  - Off pressors 12/26/2012 - Weaning trial of MV 12/26/2012 - Extubated 12/26/2012  SUBJECTIVE:  12/28/2012 Alert and sitting on chair. Able to walk in the room. No resp distress. Afebrile. Awaiting bed for transfer.    VITAL SIGNS: Temp:  [97.5 F (36.4 C)-98.5 F (36.9 C)] 98.1 F (36.7 C) (02/28 0349) Pulse Rate:  [44-92] 53 (02/28 0800) Resp:  [13-24] 16 (02/28 0800) BP: (79-106)/(50-75) 89/53 mmHg (02/28 0800) SpO2:  [97 %-100 %] 100 % (02/28 0800) HEMODYNAMICS:   VENTILATOR SETTINGS:    INTAKE / OUTPUT: Intake/Output     02/27 0701 - 02/28 0700 02/28 0701 - 03/01 0700   I.V. (mL/kg) 270 (5.7)    NG/GT     IV Piggyback     Total Intake(mL/kg) 270 (5.7)    Urine (mL/kg/hr) 401 (0.4)    Stool 1 (0)    Total Output 402     Net -132          Urine Occurrence 3 x     PHYSICAL EXAMINATION: General: Alert. NAD HEENT: no adenopathies no JVD Heart: Normal S1, S2. No murmurs, rubs, or gallops appreciated. Lungs: diminished breath sounds bilaterally, no rales or wheezes. Abdomen: Abdomen soft, non-tender and not distended, normoactive bowel sounds. Musculoskeletal: No clubbing or synovitis. Skin: No rashes or lesions Neuro: Alert and follows commands.   IMAGING: No results found. ASSESSMENT / PLAN:  PULMONARY  Recent Labs Lab 12/24/12 0039 12/24/12  1610 12/24/12 0454 12/24/12 0525  PHART  --  7.449 7.319*  --   PCO2ART  --  26.0* 28.0*  --   PO2ART  --  64.0* 125.0*  --   HCO3  --  18.0* 14.0*  --   TCO2 20 19 14.8  --   O2SAT  --  94.0 98.9 79.6   A: 1) Acute hypoxemic respiratory failure  2) Pneumonia in severely immunocompromised patient due to HIV. Chest X ray suggestive of PCP pneumonia 3) History of tuberculosis that was treated successfully in the past - Bronchoscopy lavage results are negative  P:   - monitor  - antibiotic as above -  prednisone taper  CARDIOVASCULAR  Recent Labs Lab 12/24/12 0039  12/24/12 0236 12/25/12 1037 12/26/12 0408 12/27/12 0410  LATICACIDVEN 1.08  --  1.1  --   --   --   PROCALCITON  --   < > 4.66 3.56 2.57 1.06  < > = values in this interval not displayed.No results found for this basename: PROBNP,  in the last 168 hours    Recent Labs Lab 12/24/12 0021  TROPONINI <0.30   A:  1) Septic shock >> resolved   P: - Monitor vitals - Continue Prednisone taper.   RENAL   Recent Labs Lab 12/24/12 0039  12/24/12 0500 12/25/12 0410 12/26/12 0408 12/27/12 0410 12/28/12 0427  NA 139  --  136 137 137 136 136  K 3.5  --  3.6 4.1 3.3* 4.1 4.6  CL 107  --  112 111 107 102 104  CO2  --   --  16* 18* 21 27 25   GLUCOSE 93  --  123* 134* 146* 114* 130*  BUN 17  --  13 15 18 15  24*  CREATININE 1.20  < > 0.97 0.80 0.77 0.80 0.92  CALCIUM  --   --  5.9* 7.3* 7.9* 8.3* 8.3*  MG  --   --  1.2* 2.1 1.9 2.0  --   PHOS  --   --  3.0 2.9 2.7 4.9*  --   < > = values in this interval not displayed.  A:   electrolites wnl P: - Monitor  GASTROINTESTINAL  Recent Labs Lab 12/24/12 0232  INR 1.28   A:   1) No issues P:   - GI prophylaxis with protonix - CHO diet  HEMATOLOGIC  Recent Labs Lab 12/26/12 0408 12/27/12 0410 12/28/12 0427  HGB 9.6* 9.5* 9.5*  HCT 28.0* 27.4* 27.3*  WBC 17.4* 10.5 9.5  PLT 207 238 260   A:   1) Anemia>> stable P:  - No indication for blood transfusion - Monitor  INFECTIOUS  Recent Labs Lab 12/24/12 0039  12/24/12 0236 12/25/12 1037 12/26/12 0408 12/27/12 0410  LATICACIDVEN 1.08  --  1.1  --   --   --   PROCALCITON  --   < > 4.66 3.56 2.57 1.06  < > = values in this interval not displayed. A:   1) HIV, viral load from January 96045 and CD4 count > 200. Now Viral load is 67721 and CD4 count is 230 2) Bilateral pneumonia. Results from BAL are negative including cytology.  P:   - ID following. - Antibiotics as above (all are PO  now)  ENDOCRINE A:   1) DM   P:   - SSI - CHO diet  NEUROLOGIC A:   1) No issues P:   - monitor  - Pt ready for transfer Telemetry  bed is appropriate.   Signed D. [place] Rolene Arbour, MD Family Medicine  PGY-2  12/28/2012 8:13 AM   STAFF NOTE: I, Dr Lavinia Sharps have personally reviewed patient's available data, including medical history, events of note, physical examination and test results as part of my evaluation. I have discussed with resident/NP and other care providers such as pharmacist, RN and RRT.  In addition,  I personally evaluated patient and elicited key findings of acute respiratory failure not otherwise specified in this HIV patient. He is now doing well 48 hours post extubation. He does not need stepped-down bed anymore. We will transfer him to the medical floor and internal medicine teaching service will pick up service today.  Rest per NP/medical resident whose note is outlined above and that I agree with    Dr. Kalman Shan, M.D., [facility].C.P Pulmonary and Critical Care Medicine Staff Physician [facility] [place] [facility] Pager: [phone], If no answer or between  15:00h - 7:00h: call [phone]  12/28/2012 12:17 PM

## 2012-12-29 LAB — BASIC METABOLIC PANEL
BUN: 28 mg/dL — ABNORMAL HIGH (ref 6–23)
Chloride: 102 mEq/L (ref 96–112)
GFR calc Af Amer: >90 mL/min (ref >90)
Potassium: 4.6 mEq/L (ref 3.5–5.1)
Sodium: 133 mEq/L — ABNORMAL LOW (ref 135–145)

## 2012-12-29 LAB — LEGIONELLA CULTURE

## 2012-12-29 LAB — GLUCOSE, CAPILLARY
Glucose-Capillary: 119 mg/dL — ABNORMAL HIGH (ref 70–99)
Glucose-Capillary: 125 mg/dL — ABNORMAL HIGH (ref 70–99)

## 2012-12-29 LAB — CBC
HCT: 29.5 % — ABNORMAL LOW (ref 39.0–52.0)
Hemoglobin: 10.1 g/dL — ABNORMAL LOW (ref 13.0–17.0)
RDW: 15.7 % — ABNORMAL HIGH (ref 11.5–15.5)
WBC: 10.3 10*3/uL (ref 4.0–10.5)

## 2012-12-29 MED ORDER — DICYCLOMINE HCL 20 MG PO TABS: 20.0000 mg | ORAL_TABLET | Freq: Two times a day (BID) | ORAL | Status: DC

## 2012-12-29 MED ORDER — SULFAMETHOXAZOLE-TRIMETHOPRIM 400-80 MG PO TABS: 3.0000 | ORAL_TABLET | Freq: Three times a day (TID) | ORAL | Status: DC

## 2012-12-29 MED ORDER — AZITHROMYCIN 600 MG PO TABS: 1200.0000 mg | ORAL_TABLET | ORAL | Status: DC

## 2012-12-29 MED ORDER — LEVOFLOXACIN 500 MG PO TABS: 500.0000 mg | ORAL_TABLET | Freq: Every day | ORAL | Status: DC

## 2012-12-29 MED ORDER — ENSURE COMPLETE PO LIQD: 237.0000 mL | Freq: Three times a day (TID) | ORAL | Status: DC

## 2012-12-29 MED ORDER — PREDNISONE (PAK) 10 MG PO TABS: ORAL_TABLET | ORAL | Status: DC

## 2012-12-29 MED ORDER — ASPIRIN 81 MG PO CHEW
CHEWABLE_TABLET | ORAL | Status: AC
  Filled 2012-12-29: qty 1

## 2012-12-29 NOTE — Discharge Summary (Signed)
Patient Name:  Eric Jefferson  MRN: [id number]  PCP: No primary provider on file.  DOB:  01/29/1961       Date of Admission:  12/23/2012  Date of Discharge:  12/29/2012      Attending Physician: Dr. Kalman Shan, MD         DISCHARGE DIAGNOSES: 1. Principal Problem: 2.   CAP vs. PCP,  with Bilateral pulmonary infiltrates on CXR 3. Active Problems: 4.    HIV DISEASE 5.  TOBACCO ABUSE 6.   GERD 7.   PULMONARY TUBERCULOSIS 8.   Loss of weight   DISPOSITION AND FOLLOW-UP: Eric Jefferson is to follow-up with the listed providers as detailed below, at patient's visiting, please address following issues:  1. Check his blood pressure to assure that his blood pressure OK. His bp was 92/63 mmHg at discharge. If his SBP<90 mmHg, may add fludrocortisone.   2. He need to repeat CXR to assure complete resolution of his PNA in 6 weeks.   Follow-up Information   Follow up with Johny Sax, MD On 01/07/2013. (on 01/07/13)    Contact information:   [address]  [address] [place] [address] [phone]      Discharge Orders   Future Appointments Provider Department Dept Phone   02/20/2013 9:15 AM [facility] [phone]   03/06/2013 9:30 AM Ginnie Smart, MD [facility] [phone]   Future Orders Complete By Expires     Call MD for:  difficulty breathing, headache or visual disturbances  As directed     Call MD for:  persistant dizziness or light-headedness  As directed     Call MD for:  redness, tenderness, or signs of infection (pain, swelling, redness, odor or green/yellow discharge around incision site)  As directed     Call MD for:  temperature >100.4  As directed     Increase activity slowly  As directed         DISCHARGE MEDICATIONS:   Medication List    TAKE these medications       azithromycin 600 MG tablet  Commonly known as:  ZITHROMAX  Take 2  tablets (1,200 mg total) by mouth every Friday.     dicyclomine 20 MG tablet  Commonly known as:  BENTYL  Take 1 tablet (20 mg total) by mouth 2 (two) times daily.     elvitegravir-cobicistat-emtricitabine-tenofovir 150-150-200-300 MG Tabs  Commonly known as:  STRIBILD  Take 1 tablet by mouth daily with breakfast.     famotidine 20 MG tablet  Commonly known as:  PEPCID  One at bedtime     feeding supplement Liqd  Take 237 mLs by mouth 3 (three) times daily between meals.     ibuprofen 200 MG tablet  Commonly known as:  ADVIL,MOTRIN  Take 200 mg by mouth every 6 (six) hours as needed for fever.     levofloxacin 500 MG tablet  Commonly known as:  LEVAQUIN  Take 1 tablet (500 mg total) by mouth daily.     omeprazole 20 MG capsule  Commonly known as:  PRILOSEC  Take 1 capsule (20 mg total) by mouth daily.     predniSONE 10 MG tablet  Commonly known as:  STERAPRED UNI-PAK  Please take 40 mg twice on 12/30/12, then take 40 mg daily on 3/3, 3/4, 3/5, 3/6 and 3/7, then take 20 mg daily for 11  days.     sulfamethoxazole-trimethoprim 400-80 MG per tablet  Commonly known as:  BACTRIM,SEPTRA  Take 3 tablets by mouth every 8 (eight) hours.     traMADol 50 MG tablet  Commonly known as:  ULTRAM  1-2 every 4 hours as needed for cough or pain         CONSULTS:  Treatment Team:  Md Pccm, MD    PROCEDURES PERFORMED:  Dg Chest 2 View  12/05/2012  *RADIOLOGY REPORT*  Clinical Data: Cough  CHEST - 2 VIEW  Comparison: 08/11/2008  Findings: Ill-defined innumerable airspace nodules are scattered throughout both lungs.  There is an upper lobe lobe distribution. No pneumothorax.  Upper normal heart size.  No pleural effusion. No obvious mediastinal mass effect to suggest adenopathy.  IMPRESSION: Extensive bilateral airspace nodules with an upper lobe distribution.  This is most compatible with an inflammatory process.  Disseminated TB infection is in the differential diagnosis.   Original  Report Authenticated By: Jolaine Click, M.D.    Dg Chest Port 1 View  12/26/2012  *RADIOLOGY REPORT*  Clinical Data: Assess endotracheal tube.  PORTABLE CHEST - 1 VIEW  Comparison: 12/25/2012.  Findings: Endotracheal tube is in satisfactory position.  Left subclavian central line tip projects over the SVC.  Heart size normal.  There is moderate diffuse bilateral air space disease, unchanged.  No definite pleural fluid.  IMPRESSION: Moderate diffuse bilateral air space disease, suspicious for atypical or fungal infection.  Disseminated tuberculosis is not excluded.   Original Report Authenticated By: Leanna Battles, M.D.    Dg Chest Port 1 View  12/25/2012  *RADIOLOGY REPORT*  Clinical Data: Check endotracheal tube  PORTABLE CHEST - 1 VIEW  Comparison: Chest radiograph 12/24/2012  Findings: Endotracheal tube and left central venous line are unchanged.  Stable cardiac silhouette.  There is some improvement in the diffuse fine bilateral air space disease.  No pneumothorax. No acute osseous abnormality.  IMPRESSION: 1.  Stable support apparatus.  2.  Improvement in diffuse fine air space disease.   Original Report Authenticated By: Genevive Bi, M.D.    Dg Chest Portable 1 View  12/24/2012  *RADIOLOGY REPORT*  Clinical Data: Central line placement.  Endotracheal tube placement.  Shortness of breath.  Hypotension.  PORTABLE CHEST - 1 VIEW  Comparison: 12/24/2012 at 0024 hours.  Findings: Interval placement endotracheal tube with tip about 3.8 cm above the carina.  Interval placement of left central venous catheter with tip over the low SVC region.  No pneumothorax.  No blunting of costophrenic angles.  Normal heart size and pulmonary vascularity.  Diffuse airspace and interstitial changes throughout the lungs consistent with edema, infection, or inflammatory process.  No significant change since previous study in the parenchymal pattern.  IMPRESSION: Appliances appear to be in satisfactory location.  No  pneumothorax. Persistent bilateral pulmonary infiltrates without change.   Original Report Authenticated By: Burman Nieves, M.D.    Dg Chest Portable 1 View  12/24/2012  *RADIOLOGY REPORT*  Clinical Data: Shortness of breath. History of pulmonary tuberculosis and HIV.  PORTABLE CHEST - 1 VIEW  Comparison: 12/05/2012 and prior chest radiographs  Findings: Diffuse bilateral interstitial/airspace nodular opacities are again noted and relatively unchanged. The cardiomediastinal silhouette is stable. There is no evidence of pleural effusion or pneumothorax. No acute bony abnormalities are present.  IMPRESSION: Unchanged diffuse bilateral interstitial/airspace nodular opacities likely representing inflammatory or infectious process.   Original Report Authenticated By: Harmon Pier, M.D.        ADMISSION DATA:  H&P: 52 years old vi[place] man with HIV on antiretroviral therapy, DM and history of pulmonary TB, presents with 2 weeks of SOB and fever. History limited due to poor english speaking. History taken through a family member. Patient unable to speak due to severe respiratory distress. Apparently he has been deteriorating over the last few months but two weeks ago started having fever, productive cough and progressive SOB. At admission hypotensive, tachycardic and hypoxic with PO2 of 64 on 2 L/min via [place]. In severe respiratory distress with RR of 38 to 40. Last viral load 56998 on 11/14/12.  His CD4 count on February 5 was 230 and his HIV viral load was 56,998. He was just recently learned that he has been unable to obtain his Sribild. Applications have been sent him for patient assistance to the [facility] AIDS drug assistance program.  PHYSICAL EXAMINATION: General: Awake intubated looks critically ill   Eyes: Anicteric sclerae. ENT: Oropharynx clear. Dry mucous membranes. No thrush Lymph: No cervical, supraclavicular, or axillary lymphadenopathy. Heart: Normal S1, S2. No murmurs, rubs, or  gallops appreciated. No bruits, equal pulses. Lungs: Using accessory respiratory muscles with intercostal retraction, no dullness to percussion. Good air movement bilaterally, without wheezes or crackles. Normal upper airway sounds without evidence of stridor. Abdomen: Abdomen soft, non-tender and not distended, normoactive bowel sounds. No hepatosplenomegaly or masses. Musculoskeletal: No clubbing or synovitis. Skin: No rashes or lesions Neuro: No focal neurologic deficits. LABS:  Recent Labs Lab  12/24/12 0021  12/24/12 0039  12/24/12 0054  12/24/12 0232  12/24/12 0236  12/24/12 0454  12/24/12 0500   HGB  10.1*  10.9*   --    --    --    --   9.3*   WBC  10.0   --    --    --    --    --   8.4   PLT  211   --    --    --    --    --   201   NA   --   139   --    --    --    --   136   K   --   3.5   --    --    --    --   3.6   CL   --   107   --    --    --    --   112   CO2   --    --    --    --    --    --   16*   GLUCOSE   --   93   --    --    --    --   123*   BUN   --   17   --    --    --    --   13   CREATININE   --   1.20   --   0.96   --    --   0.97   CALCIUM   --    --    --    --    --    --   5.9*   MG   --    --    --    --    --    --   1.2*  PHOS   --    --    --    --    --    --   3.0   APTT   --    --    --   33   --    --    --    INR   --    --    --   1.28   --    --    --    LATICACIDVEN   --   1.08   --    --   1.1   --    --    TROPONINI  <0.30   --    --    --    --    --    --    PROCALCITON   --    --    --    --   4.66   --    --    PHART   --    --   7.449   --    --   7.319*   --    PCO2ART   --    --   26.0*   --    --   28.0*   --    PO2ART   --    --   64.0*   --    --   125.0*   --      Recent Labs Lab  12/24/12 0504  12/24/12 0818  12/24/12 1249   GLUCAP  120*  108*  136*   imaging x0.48 hours    HOSPITAL COURSE:  # Acute Hypoxic Respiratory Failure Secondary to Pneumonia: Patient has hx of HIV (CD4 230 on 12/24/12). He was  admitted to ICU because of severe shortness of breath with hypoxia in septic shock on 12/24/12.  He was intubated. CXR with bilateral interstitial/airspace nodular opacities. He was treated vanc, zosyn, and bactrim as well as prednisone for PNA. BAL on 12/24/2012 negative for respiratory virus panel, PCP, Legionella, AFB smear, blood cultures negative. He was extubated on 12/26/2012 without difficulty. He was transferred to floor on 12/28/12. He was switched to oral Levaquin, Bactrim, prednisone covering for community acquired pneumonia and PCP. His condition continued to improve.  At discharge, he has minimal SOB, no chest pain, no fever or chills, no leukocytosis. He was discharged on oral Levofloxacin 500 mg daily X 10 days; Bactrim 400-80 mg 3 tablets tid for 10 days; and tapering prednisone. Patient's baseline BP is 100/60s and he is close to that currently. Patient has an appointment with Dr. [name] on 01/07/13, which was made before. I encouraged him not to miss the appointment.   # HIV: diagnosed in 2009 with active pulmonary TB, treated with full course. Last viral load was 67K. CD4 count 230 on 12/24/12. Unclear compliance with ART, has had trouble obtaining home Stribild recently. Poorly controlled HIV. We continued STRIBILD in hospital. He will see Dr. [name] on 01/07/13.  #: Dizziness:  Patient has baseline low blood pressure and likely has orthostatic hypotension. C/o dizziness with activity and standing up from sitting. May consider adding mineralocorticoid in future if worsening.   # Abdominal Pain and loose stools: Patient has had loose stools 2-3 per day for the past month. May be HIV diarrhea given poorly controlled HIV. Less likely cryptosporidium since not acute in nature. Stable at discharge.  # Malnutrition/Diet: treated carb modified diet with ensure supplementation.  Continued ensure on discharge.   DISCHARGE DATA: Vital Signs: BP 92/63  Pulse 72  Temp(Src) 98 F (36.7 C) (Oral)   Resp 14  Ht 5\' 4"  (1.626 m)  Wt 94 lb 5.7 oz (42.8 kg)  BMI 16.19 kg/m2  SpO2 100%  Labs: Results for orders placed during the hospital encounter of 12/23/12 (from the past 24 hour(s))  GLUCOSE, CAPILLARY     Status: Abnormal   Collection Time    12/28/12  9:30 PM      Result Value Range   Glucose-Capillary 241 (*) 70 - 99 mg/dL  CBC     Status: Abnormal   Collection Time    12/29/12  4:45 AM      Result Value Range   WBC 10.3  4.0 - 10.5 K/uL   RBC 4.03 (*) 4.22 - 5.81 MIL/uL   Hemoglobin 10.1 (*) 13.0 - 17.0 g/dL   HCT 16.1 (*) 09.6 - 04.5 %   MCV 73.2 (*) 78.0 - 100.0 fL   MCH 25.1 (*) 26.0 - 34.0 pg   MCHC 34.2  30.0 - 36.0 g/dL   RDW 40.9 (*) 81.1 - 91.4 %   Platelets 291  150 - 400 K/uL  BASIC METABOLIC PANEL     Status: Abnormal   Collection Time    12/29/12  4:45 AM      Result Value Range   Sodium 133 (*) 135 - 145 mEq/L   Potassium 4.6  3.5 - 5.1 mEq/L   Chloride 102  96 - 112 mEq/L   CO2 23  19 - 32 mEq/L   Glucose, Bld 124 (*) 70 - 99 mg/dL   BUN 28 (*) 6 - 23 mg/dL   Creatinine, Ser 7.82  0.50 - 1.35 mg/dL   Calcium 8.5  8.4 - 95.6 mg/dL   GFR calc non Af Amer >90  >90 mL/min   GFR calc Af Amer >90  >90 mL/min  GLUCOSE, CAPILLARY     Status: Abnormal   Collection Time    12/29/12  6:31 AM      Result Value Range   Glucose-Capillary 119 (*) 70 - 99 mg/dL  GLUCOSE, CAPILLARY     Status: Abnormal   Collection Time    12/29/12 11:18 AM      Result Value Range   Glucose-Capillary 125 (*) 70 - 99 mg/dL     Services Ordered on Discharge: Y = Yes; Blank = No PT:   OT:   RN:   Equipment:   Other:    Time Spent on Discharge: 36 min   Signed: Lorretta Harp, MD PGY 2, Internal Medicine Resident 12/29/2012, 5:51 PM

## 2012-12-29 NOTE — Progress Notes (Signed)
Discharged to home with family office visits in place teaching done Discharged to home with family office visits in place teaching done  

## 2012-12-29 NOTE — Progress Notes (Signed)
   CARE MANAGEMENT NOTE 12/29/2012  Patient:  Eric Jefferson, Eric Jefferson   Account Number:  [id number]  Date Initiated:  12/24/2012  Documentation initiated by:  [facility]  Subjective/Objective Assessment:   resp failure - intubated - on pressors.  speaks no english.     Action/Plan:   Anticipated DC Date:  12/31/2012   Anticipated DC Plan:  HOME W HOME HEALTH SERVICES      DC Planning Services  CM consult  MATCH Program      Choice offered to / List presented to:             Status of service:  Completed, signed off Medicare Important Message given?   (If response is "NO", the following Medicare IM given date fields will be blank) Date Medicare IM given:   Date Additional Medicare IM given:    Discharge Disposition:  HOME/SELF CARE  Per UR Regulation:  Reviewed for med. necessity/level of care/duration of stay  If discussed at Long Length of Stay Meetings, dates discussed:    Comments:   12/29/2012 1745  NCM spoke to pt and gave permission to speak to son, Eric Jefferson. Son states his Medicaid does not pay for his medications. States he uses [facility] on [address]. NCM contacted pharmacist at [facility] and she did a test Rx to insurance. And came back no coverage for Rx. Verified Medicaid number with pharmacist and numbers match. Spoke to Asst Director Case Mgmt and approval received to use MATCH program just for medication written post dc from hospital. Follow up with pt's son to make aware of meds that covered under MATCH. Explained program can be used once per year. Instructed him to follow up with [facility] for Stribild for coverage or assistance with medication. Also to contact Medicaid Case Worker to find out why medications are not covered by insurance. Community resources such as [employer] representative, [employer], and [employer] included on [employer]. NCM contacted [facility] and spoke to pharmacist that pt has MATCH letter for Rx post dc.  Eric Donning RN CCM Case Mgmt phone [phone] Contact:   Eric Jefferson,Eric Jefferson Other [phone]  12-27-12 10:30am Eric Jefferson, RNBSN [phone] now extubated - doing well.

## 2012-12-30 LAB — CULTURE, BLOOD (ROUTINE X 2): Culture: NO GROWTH

## 2012-12-30 NOTE — Progress Notes (Signed)
Subjective   Mr. Casamento feels much better today. He walked in hallway with only minimal SOB. No chest pain.  Denies fever, chills, chest pain.  Objective: Vital signs in last 24 hours: Filed Vitals:   12/28/12 1346 12/28/12 2026 12/29/12 0446 12/29/12 1256  BP: 98/67 95/62 104/61 92/63  Pulse: 61 56 49 72  Temp: 98.1 F (36.7 C) 98.3 F (36.8 C) 97.4 F (36.3 C) 98 F (36.7 C)  TempSrc: Oral Oral Axillary Oral  Resp: 18 16 16 14   Height:      Weight:   94 lb 5.7 oz (42.8 kg)   SpO2: 98% 97% 98% 100%   Weight change:  No intake or output data in the 24 hours ending 12/30/12 1610  Physical Exam Blood pressure 92/63, pulse 72, temperature 98 F (36.7 C), temperature source Oral, resp. rate 14, height 5\' 4"  (1.626 m), weight 94 lb 5.7 oz (42.8 kg), SpO2 100.00%. General:  Thin, No acute distress, alert and oriented,  HEENT:  PERRL, EOMI, moist mucous membranes, nasal cannula in place Cardiovascular:  Regular rate and rhythm, no murmurs, rubs or gallops Respiratory:  Clear to auscultation bilaterally, no wheezes, rales, or rhonchi Abdomen:  Soft, nondistended, RLQ tenderness, bowel sounds present, no rebound or guarding Extremities:  Warm and well-perfused, no edema.  Skin: Warm, dry, no rashes Neuro: Not anxious appearing, no depressed mood, normal affect  Lab Results: Basic Metabolic Panel:  Recent Labs Lab 12/26/12 0408 12/27/12 0410 12/28/12 0427 12/29/12 0445  NA 137 136 136 133*  K 3.3* 4.1 4.6 4.6  CL 107 102 104 102  CO2 21 27 25 23   GLUCOSE 146* 114* 130* 124*  BUN 18 15 24* 28*  CREATININE 0.77 0.80 0.92 0.90  CALCIUM 7.9* 8.3* 8.3* 8.5  MG 1.9 2.0  --   --   PHOS 2.7 4.9*  --   --    CBC:  Recent Labs Lab 12/26/12 0408 12/27/12 0410 12/28/12 0427 12/29/12 0445  WBC 17.4* 10.5 9.5 10.3  NEUTROABS 15.0* 9.3*  --   --   HGB 9.6* 9.5* 9.5* 10.1*  HCT 28.0* 27.4* 27.3* 29.5*  MCV 73.1* 72.5* 72.8* 73.2*  PLT 207 238 260 291   Cardiac  Enzymes:  Recent Labs Lab 12/24/12 0021  TROPONINI <0.30   CBG:  Recent Labs Lab 12/27/12 0757 12/27/12 1534 12/28/12 1643 12/28/12 2130 12/29/12 0631 12/29/12 1118  GLUCAP 105* 209* 143* 241* 119* 125*   Coagulation:  Recent Labs Lab 12/24/12 0232  LABPROT 15.7*  INR 1.28   Urinalysis:  Recent Labs Lab 12/24/12 0507  COLORURINE STRAW*  LABSPEC 1.007  PHURINE 6.0  GLUCOSEU NEGATIVE  HGBUR NEGATIVE  BILIRUBINUR NEGATIVE  KETONESUR NEGATIVE  PROTEINUR NEGATIVE  UROBILINOGEN 0.2  NITRITE NEGATIVE  LEUKOCYTESUR NEGATIVE       Studies/Results: No results found. Medications:  Medications reviewed  Scheduled Meds:  Continuous Infusions: PRN Meds:.    Assessment/Plan:  Acute Hypoxic Respiratory Failure Secondary to Pneumonia: improving.  Patient presented with shortness of breath with hypoxia in septic shock, was intubated and admitted to the ICU. CXR with bilateral interstitial/airspace nodular opacities. Started on vanc, zosyn, and bactrim as well as prednisone for PNA BAL on 12/24/2012 negative for respiratory virus panel, PCP, Legionella, AFB smear, cultures negative so far. He was extubated to 12/26/2012 without difficulty. Currently on Levaquin, Bactrim, prednisone covering for community acquired pneumonia and PCP. Patient's baseline BP is 100/60s and he is close to that currently. He is off  oxygen.   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 Last viral load was 67K. CD4 count 230 on 12/24/12. Unclear compliance with ART, has had trouble obtaining home Stribild recently. Poorly controlled HIV.   -Continue STRIBILD -ID following, appreciate recs  Dizziness Patient has baseline low blood pressure and likely has orthostatic hypotension. C/o dizziness with activity and standing up from sitting. Will consider adding  mineralocorticoid in future if not worsening.   Abdominal Pain and loose stools Patient has had loose stools 2-3 per day for the past month. May be HIV diarrhea given poorly controlled HIV. Less likely cryptosporidium since not acute in nature. Will monitor for now  Malnutrition/Diet Carb modified diet with ensure supplementation  DVT PPx -Heparin  Dispo  -Patient lives at home with his wife -anticipate dc tomorrow.     LOS: 6 days   Lorretta Harp 12/30/2012, 4:10 PM

## 2012-12-31 ENCOUNTER — Other Ambulatory Visit: Payer: Self-pay | Admitting: *Deleted

## 2012-12-31 DIAGNOSIS — B20 Human immunodeficiency virus [HIV] disease: Secondary | ICD-10-CM

## 2012-12-31 LAB — HIV-1 RNA QUANT-NO REFLEX-BLD
HIV 1 RNA Quant: 223069 copies/mL — ABNORMAL HIGH (ref <20)
HIV-1 RNA Quant, Log: 5.35 {Log} — ABNORMAL HIGH (ref <1.30)

## 2012-12-31 MED ORDER — ELVITEG-COBIC-EMTRICIT-TENOFDF 150-150-200-300 MG PO TABS
1.0000 | ORAL_TABLET | Freq: Every day | ORAL | Status: DC
Start: 2012-12-31 — End: 2013-01-16

## 2013-01-01 ENCOUNTER — Telehealth: Payer: Self-pay | Admitting: Pharmacist Clinician (PhC)/ Clinical Pharmacy Specialist

## 2013-01-01 NOTE — Telephone Encounter (Signed)
Called pt to let him know that his ADAP has been approved. He told me that he picked up the Stribild on 12/31/12 and has started taking it. Discussed compliance with him.

## 2013-01-15 ENCOUNTER — Other Ambulatory Visit: Payer: Self-pay | Admitting: Licensed Clinical Social Worker

## 2013-01-15 ENCOUNTER — Other Ambulatory Visit: Payer: Self-pay | Admitting: Internal Medicine

## 2013-01-15 NOTE — Telephone Encounter (Signed)
Patient called wanting a refill but he couldn't communicate to me what rx's he needed and Clair Gulling, spoke with him and asked the patient to write the names of the medications and Rogelia Boga will call him tomorrow.

## 2013-01-16 ENCOUNTER — Encounter: Payer: Self-pay | Admitting: Infectious Diseases

## 2013-01-16 ENCOUNTER — Ambulatory Visit (INDEPENDENT_AMBULATORY_CARE_PROVIDER_SITE_OTHER): Payer: Self-pay | Admitting: Infectious Diseases

## 2013-01-16 VITALS — BP 113/85 | HR 81 | Temp 97.9°F | Wt 107.0 lb

## 2013-01-16 DIAGNOSIS — B20 Human immunodeficiency virus [HIV] disease: Secondary | ICD-10-CM

## 2013-01-16 DIAGNOSIS — R918 Other nonspecific abnormal finding of lung field: Secondary | ICD-10-CM

## 2013-01-16 MED ORDER — SULFAMETHOXAZOLE-TRIMETHOPRIM 400-80 MG PO TABS: 1.0000 | ORAL_TABLET | Freq: Every day | ORAL | Status: DC

## 2013-01-16 MED ORDER — AZITHROMYCIN 600 MG PO TABS: 1200.0000 mg | ORAL_TABLET | ORAL | Status: DC

## 2013-01-16 MED ORDER — DICYCLOMINE HCL 20 MG PO TABS: 20.0000 mg | ORAL_TABLET | Freq: Two times a day (BID) | ORAL | Status: DC

## 2013-01-16 MED ORDER — ELVITEG-COBIC-EMTRICIT-TENOFDF 150-150-200-300 MG PO TABS: 1.0000 | ORAL_TABLET | Freq: Every day | ORAL | Status: DC

## 2013-01-16 NOTE — Assessment & Plan Note (Addendum)
He appears to be doing well, just needs to take his ART. He needs pnvx but we are out. i spoke with pharmacy and have refilled his meds.Apparently his Medicaid has been declined, will work towards fixing this. He does have ADAP.  Consider stopping Bentyl at next visit. Will see him back in 6-8 weeks with labs prior. May need adherence ranger.Marland KitchenMarland KitchenMarland Kitchen

## 2013-01-16 NOTE — Progress Notes (Signed)
  Subjective:    Patient ID: Eric Jefferson, male    DOB: Oct 11, 1961, 52 y.o.   MRN: [id number]  HPI 52 yo M with HIV/AIDS, previously treated TB, adm to [facility] 2-23 to 3-1 with worsening SOB and nodular lung disease. He underwent BAL on 2-24 negative for PCP, respiratory viruses, Fungus, TB, legionella.  He was treated empirically for PCP and CAP (bactrim and LVQ respectively).  Has occas lower abd pain. No problems with BM. No problems with urination.   Has not been able to get all his medications, need paperwork from [facility].  Not clear he is taking stribilid.    HIV 1 RNA Quant (copies/mL)  Date Value  12/27/2012 409811*  12/24/2012 91478*  11/14/2012 29562*     CD4 T Cell Abs (cmm)  Date Value  12/27/2012 90*  12/05/2012 230*  11/14/2012 230*    Review of Systems     Objective:   Physical Exam  Constitutional: He appears well-developed and well-nourished.  HENT:  Mouth/Throat: No oropharyngeal exudate.  Eyes: EOM are normal. Pupils are equal, round, and reactive to light.  Neck: Neck supple.  Cardiovascular: Normal rate, regular rhythm and normal heart sounds.   Pulmonary/Chest: Effort normal. No respiratory distress.    Lymphadenopathy:    He has no cervical adenopathy.          Assessment & Plan:

## 2013-01-16 NOTE — Assessment & Plan Note (Signed)
He is doing much better after treatment for CAP, PCP and course of steroids. Will need to f/u his CXR.

## 2013-01-17 ENCOUNTER — Telehealth: Payer: Self-pay | Admitting: Internal Medicine

## 2013-01-17 ENCOUNTER — Other Ambulatory Visit: Payer: Self-pay | Admitting: Infectious Diseases

## 2013-01-17 ENCOUNTER — Telehealth: Payer: Self-pay | Admitting: Family Medicine

## 2013-01-17 ENCOUNTER — Telehealth: Payer: Self-pay | Admitting: Pharmacist Clinician (PhC)/ Clinical Pharmacy Specialist

## 2013-01-17 MED ORDER — ITRACONAZOLE 10 MG/ML PO SOLN: ORAL | Status: DC

## 2013-01-17 MED ORDER — ITRACONAZOLE 200 MG PO TABS: 1.0000 | ORAL_TABLET | Freq: Three times a day (TID) | ORAL | Status: DC

## 2013-01-17 NOTE — Assessment & Plan Note (Signed)
Will start pt on itra. See him back in 2 weeks, check serum itra level. Ask RN to have this explained to pt via interpreter.

## 2013-01-17 NOTE — Telephone Encounter (Signed)
Thank you so much for letting me know. It is a very interesting case indeed! D. Piloto Rolene Arbour, MD.  Family Medicine PGY-2

## 2013-01-17 NOTE — Telephone Encounter (Signed)
Trey Paula  FYI his BAL is growing HISTO. Got result 01/17/2013   Thanks   Dr. Kalman Shan, M.D., F.C.C.P Pulmonary and Critical Care Medicine Staff Physician [facility]  Pulmonary and Critical Care Pager: [phone], If no answer or between  15:00h - 7:00h: call [phone]  01/17/2013 2:27 AM

## 2013-01-17 NOTE — Telephone Encounter (Signed)
I was puzzled that everything was negative with those infiltrates. Interesting result. Thanks for addressing it   Dr. Kalman Shan, M.D., [facility].C.P Pulmonary and Critical Care Medicine Staff Physician [facility] [place] Pulmonary and Critical Care Pager: [phone], If no answer or between  15:00h - 7:00h: call [phone]  01/17/2013 3:30 PM

## 2013-01-17 NOTE — Telephone Encounter (Signed)
Pt with newly dx histo. Eric Jefferson called me to see if I could call pt regarding his new meds. I called pt to pick up itraconazole from pharmacy and explained how to take it. He is to cont his new meds until told otherwise.

## 2013-01-17 NOTE — Telephone Encounter (Signed)
Enter in error

## 2013-01-17 NOTE — Telephone Encounter (Signed)
Thanks Murali, just got this.  Will start him on itra and see him back in 2 weeks to check blood level

## 2013-01-17 NOTE — Telephone Encounter (Signed)
Dr Aviva Signs Eric Jefferson  He grew histo from his BAL cultures. I think this is agood resident confercence case. FYI as feedback and followup from your care of him  Dr. Kalman Shan, M.D., [facility].C.P Pulmonary and Critical Care Medicine Staff Physician [facility] [place] Pulmonary and Critical Care Pager: [phone], If no answer or between  15:00h - 7:00h: call [phone]  01/17/2013 3:44 PM

## 2013-01-17 NOTE — Progress Notes (Signed)
Patient notified via pharmacist [name] results of his bronchoscopy and that his Rx was sent to pharmacy and is ready to be picked up. Eric Jefferson

## 2013-01-18 ENCOUNTER — Other Ambulatory Visit: Payer: Self-pay | Admitting: *Deleted

## 2013-01-18 DIAGNOSIS — B392 Pulmonary histoplasmosis capsulati, unspecified: Secondary | ICD-10-CM

## 2013-01-18 LAB — AFB CULTURE WITH SMEAR (NOT AT ARMC): Acid Fast Smear: NONE SEEN

## 2013-01-18 MED ORDER — ITRACONAZOLE 10 MG/ML PO SOLN: ORAL | Status: DC

## 2013-01-18 MED ORDER — ITRACONAZOLE 10 MG/ML PO SOLN: 200.0000 mg | Freq: Two times a day (BID) | ORAL | Status: DC

## 2013-01-18 NOTE — Telephone Encounter (Signed)
Pharmacist at [facility] called to get a quantity on the sporanox Rx sent 01/17/13. Had to call Dr [name] who advised patient will be on this medication for 12 weeks at least. Advised the pharmacist of this and was told to send 2 separate Rx so the patient gets the correct amount of medicine.

## 2013-01-18 NOTE — Telephone Encounter (Signed)
i'll probably be presenting him to IM conference on Thursday if you want my slides or want me to come to [facility] and speak about him

## 2013-02-05 LAB — AFB CULTURE WITH SMEAR (NOT AT ARMC)

## 2013-02-06 ENCOUNTER — Encounter: Payer: Self-pay | Admitting: *Deleted

## 2013-02-07 ENCOUNTER — Encounter: Payer: Self-pay | Admitting: Internal Medicine

## 2013-02-19 ENCOUNTER — Telehealth: Payer: Self-pay

## 2013-02-19 LAB — FUNGUS CULTURE W SMEAR

## 2013-02-19 NOTE — Telephone Encounter (Signed)
Lab called regarding bronchial wash  sample.  Dr [name] called to add test for  [facility] and they are not able to perform due to specimen was discarded.  [phone]

## 2013-02-20 ENCOUNTER — Other Ambulatory Visit: Payer: Self-pay | Admitting: Infectious Diseases

## 2013-02-20 ENCOUNTER — Other Ambulatory Visit: Payer: Medicaid Other

## 2013-02-20 DIAGNOSIS — B20 Human immunodeficiency virus [HIV] disease: Secondary | ICD-10-CM

## 2013-02-20 LAB — COMPLETE METABOLIC PANEL WITH GFR
BUN: 19 mg/dL (ref 6–23)
CO2: 27 mEq/L (ref 19–32)
Creat: 1.01 mg/dL (ref 0.50–1.35)
GFR, Est African American: >89 mL/min
GFR, Est Non African American: 84 mL/min
Glucose, Bld: 92 mg/dL (ref 70–99)
Total Bilirubin: 0.2 mg/dL — ABNORMAL LOW (ref 0.3–1.2)
Total Protein: 8.5 g/dL — ABNORMAL HIGH (ref 6.0–8.3)

## 2013-02-20 LAB — CBC
HCT: 44.3 % (ref 39.0–52.0)
Hemoglobin: 14.5 g/dL (ref 13.0–17.0)
MCH: 26.6 pg (ref 26.0–34.0)
MCV: 81.3 fL (ref 78.0–100.0)
RBC: 5.45 MIL/uL (ref 4.22–5.81)

## 2013-03-06 ENCOUNTER — Other Ambulatory Visit: Payer: Self-pay | Admitting: *Deleted

## 2013-03-06 ENCOUNTER — Ambulatory Visit
Admission: RE | Admit: 2013-03-06 | Discharge: 2013-03-06 | Disposition: A | Discharge status: Home / Self Care | Payer: Medicaid Other | Source: Ambulatory Visit | Attending: Infectious Diseases | Admitting: Infectious Diseases

## 2013-03-06 ENCOUNTER — Ambulatory Visit (INDEPENDENT_AMBULATORY_CARE_PROVIDER_SITE_OTHER): Payer: Medicaid Other | Admitting: Infectious Diseases

## 2013-03-06 ENCOUNTER — Encounter: Payer: Self-pay | Admitting: Infectious Diseases

## 2013-03-06 VITALS — BP 105/77 | HR 78 | Temp 97.9°F | Wt 106.0 lb

## 2013-03-06 DIAGNOSIS — B20 Human immunodeficiency virus [HIV] disease: Secondary | ICD-10-CM

## 2013-03-06 DIAGNOSIS — B392 Pulmonary histoplasmosis capsulati, unspecified: Secondary | ICD-10-CM

## 2013-03-06 MED ORDER — HYDROCOD POLST-CHLORPHEN POLST 10-8 MG/5ML PO LQCR: 5.0000 mL | Freq: Two times a day (BID) | ORAL | Status: DC | PRN

## 2013-03-06 NOTE — Assessment & Plan Note (Signed)
Will stop azithro, bactrim. Will recheck his genotype today. Check sensitivity to INI. See him back in 2 months.

## 2013-03-06 NOTE — Assessment & Plan Note (Signed)
Will recheck his CXR. Send itra blood level. rx for tussionex. Will have [name] call him to assure his adherence.

## 2013-03-06 NOTE — Progress Notes (Signed)
  Subjective:    Patient ID: Eric Jefferson, male    DOB: 1961/03/07, 52 y.o.   MRN: [id number]  HPI 52 yo M with HIV/AIDS, previously treated TB, adm to [facility] 2-23 to 3-1 with worsening SOB and nodular lung disease. He underwent BAL on 2-24 negative for PCP, respiratory viruses, Fungus, TB, legionella.  He was treated empirically for PCP and CAP (bactrim and LVQ respectively). His BAL grew histo and he was started on itraconazole.  Today complains of cough. Pain in his anterior, posterior chest. Taking his medications well.  No fevers, no chills. Cough productive of yellow sputum.   HIV 1 RNA Quant (copies/mL)  Date Value  02/20/2013 85957*  12/27/2012 409811*  12/24/2012 91478*     CD4 T Cell Abs (cmm)  Date Value  02/20/2013 280*  12/27/2012 90*  12/05/2012 230*      Review of Systems  Constitutional: Negative for fever, chills and unexpected weight change.  Respiratory: Positive for cough.   Cardiovascular: Positive for chest pain.  Hematological: Negative for adenopathy.       Objective:   Physical Exam  Constitutional: He appears well-developed and well-nourished.  HENT:  Mouth/Throat: No oropharyngeal exudate.  Eyes: EOM are normal. Pupils are equal, round, and reactive to light.  Neck: Neck supple.  Cardiovascular: Normal rate, regular rhythm and normal heart sounds.   Pulmonary/Chest: Effort normal and breath sounds normal.  Abdominal: Soft. Bowel sounds are normal. There is no tenderness.  Lymphadenopathy:    He has no cervical adenopathy.    He has no axillary adenopathy.          Assessment & Plan:

## 2013-03-06 NOTE — Progress Notes (Signed)
Dr. [name] wants me to check if Eric Jefferson is adherence with his HIV meds. He told me he started taking Stribild around 3/3. His VL is still elevated but his CD4 is much better. He is adamant that he has only missed one dose of stribild. He was dizzy that day and forgot to take it. I reiterate the importance of taking that meds consistently. I hope he doesn't have any resistance mutation to stribild.

## 2013-03-08 LAB — HIV-1 RNA ULTRAQUANT REFLEX TO GENTYP+: HIV-1 RNA Quant, Log: 5.01 {Log} — ABNORMAL HIGH (ref <1.30)

## 2013-03-13 LAB — HIV-1 INTEGRASE GENOTYPE

## 2013-03-14 LAB — HIV-1 GENOTYPR PLUS

## 2013-03-20 ENCOUNTER — Encounter: Payer: Self-pay | Admitting: Infectious Diseases

## 2013-03-20 ENCOUNTER — Ambulatory Visit (INDEPENDENT_AMBULATORY_CARE_PROVIDER_SITE_OTHER): Payer: Medicaid Other | Admitting: Infectious Diseases

## 2013-03-20 VITALS — BP 92/60 | HR 83 | Temp 98.2°F | Wt 105.0 lb

## 2013-03-20 DIAGNOSIS — B392 Pulmonary histoplasmosis capsulati, unspecified: Secondary | ICD-10-CM

## 2013-03-20 DIAGNOSIS — B20 Human immunodeficiency virus [HIV] disease: Secondary | ICD-10-CM

## 2013-03-20 MED ORDER — ELVITEG-COBIC-EMTRICIT-TENOFDF 150-150-200-300 MG PO TABS: 1.0000 | ORAL_TABLET | Freq: Every day | ORAL | Status: DC

## 2013-03-20 MED ORDER — ITRACONAZOLE 10 MG/ML PO SOLN: 200.0000 mg | Freq: Two times a day (BID) | ORAL | Status: DC

## 2013-03-20 MED ORDER — HYDROCOD POLST-CHLORPHEN POLST 10-8 MG/5ML PO LQCR: 5.0000 mL | Freq: Two times a day (BID) | ORAL | Status: DC | PRN

## 2013-03-20 NOTE — Assessment & Plan Note (Signed)
He has been on medicine at least since March. By that count, he has had 8 weeks, would need 4 more weeks. His serum itra level is still cooking at lab. Will have clinical pharmacy give him a call as well.  Despite his claims of cough and SOB, he did not cough during the entire exam and he was quite comfortable.

## 2013-03-20 NOTE — Assessment & Plan Note (Signed)
He still has detecatble virus despite genotype showing that his ART should work. Will refill all his medicines, see him back in 2-3 weeks, repeat his labs then. If he still has detectable virus, change to TRV/DRVr/DTV

## 2013-03-20 NOTE — Progress Notes (Signed)
  Subjective:    Patient ID: Eric Jefferson, male    DOB: October 09, 1961, 52 y.o.   MRN: [id number]  HPI  52 yo M with HIV/AIDS, previously treated TB, adm to [facility] 2-23 to 3-1 with worsening SOB and nodular lung disease. He underwent BAL on 2-24 negative for PCP, respiratory viruses, Fungus, TB, legionella.  He was treated empirically for PCP and CAP (bactrim and LVQ respectively). His BAL grew histo and he was started on itraconazole. He was seen in ID clinic on 5-7 and had CXR (stable).  He has been taking stribilid, itra. Denies missed doses.  Has not been feeling well- continuing to cough, having SOB. Has had chills, no fevers. Has not been eating well (wt up 10# from March). At end of exam states he has been out of medicines for a week.   HIV 1 RNA Quant (copies/mL)  Date Value  03/06/2013 101306*  02/20/2013 85957*  12/27/2012 086578*     CD4 T Cell Abs (cmm)  Date Value  02/20/2013 280*  12/27/2012 90*  12/05/2012 230*      Review of Systems  Constitutional: Positive for chills and appetite change. Negative for fever.  Respiratory: Positive for cough and shortness of breath.   Gastrointestinal: Negative for diarrhea and constipation.  Genitourinary: Negative for difficulty urinating.       Objective:   Physical Exam  HENT:  Mouth/Throat: No oropharyngeal exudate.  Eyes: EOM are normal. Pupils are equal, round, and reactive to light.  Neck: Neck supple.  Cardiovascular: Normal rate, regular rhythm and normal heart sounds.   Pulmonary/Chest: Effort normal. No accessory muscle usage. Not tachypneic. No respiratory distress. He has decreased breath sounds.  Abdominal: Soft. Bowel sounds are normal. There is no tenderness. There is no rebound.  Musculoskeletal: He exhibits no edema.  Lymphadenopathy:    He has no cervical adenopathy.          Assessment & Plan:

## 2013-03-26 ENCOUNTER — Ambulatory Visit (INDEPENDENT_AMBULATORY_CARE_PROVIDER_SITE_OTHER): Payer: Medicaid Other | Admitting: Infectious Diseases

## 2013-03-26 DIAGNOSIS — B20 Human immunodeficiency virus [HIV] disease: Secondary | ICD-10-CM

## 2013-03-26 NOTE — Progress Notes (Signed)
HPI: Eric Jefferson is a 52 y.o. male with HIV who is here for med counseling.  Allergies: No Known Allergies  Vitals:    Past Medical History: Past Medical History  Diagnosis Date  . HIV (human immunodeficiency virus infection)   . TB (pulmonary tuberculosis)   . Diabetes     Social History: History   Social History  . Marital Status: Single    Spouse Name: N/A    Number of Children: 6  . Years of Education: N/A   Occupational History  .      [employer]   Social History Main Topics  . Smoking status: Former Smoker -- 1.00 packs/day for 30 years    Types: Cigarettes    Start date: 10/01/2012    Quit date: 12/02/2012  . Smokeless tobacco: Never Used  . Alcohol Use: No     Comment: former  . Drug Use: No  . Sexually Active: Not on file   Other Topics Concern  . Not on file   Social History Narrative  . No narrative on file    Previous Regimen:   Current Regimen: Stribild  Labs: HIV 1 RNA Quant (copies/mL)  Date Value  03/06/2013 101306*  02/20/2013 85957*  12/27/2012 161096*     CD4 T Cell Abs (cmm)  Date Value  02/20/2013 280*  12/27/2012 90*  12/05/2012 230*     Hep B S Ab (no units)  Date Value  05/09/2008 Negative      Hepatitis B Surface Ag (no units)  Date Value  01/07/2009 NEG      HCV Ab (no units)  Date Value  05/09/2008 Negative     CrCl: The CrCl is unknown because both a height and weight (above a minimum accepted value) are required for this calculation.  Lipids:    Component Value Date/Time   CHOL 192 06/20/2012 1439   TRIG 270* 06/20/2012 1439   HDL 25* 06/20/2012 1439   CHOLHDL 7.7 06/20/2012 1439   VLDL 54* 06/20/2012 1439   LDLCALC 113* 06/20/2012 1439    Assessment: 52 yo with HIV who has been on Stribild. He has had some confusion about dosing of meds. He is also on itraconazole for an aspergillus infection. His itraconazole level came back undetectable which is surprising since he is on cobicistat. He stated that he has  been taking stribild and only missed some doses due to refill issues. He is only been taking itraconazole once daily. He is on the suspension version of it and this has to be taken on an empty stomach. I explained this to him. I told him that he needs to get all of his meds refill on a week advance so he doesn't run out. He understood what I told him.  Recommendations: Cont Stribild 1 tab qday Itraconazole 200mg  bid  Clide Cliff, PharmD Clinical Infectious Disease Pharmacist [facility] 03/26/2013, 9:23 AM

## 2013-03-28 NOTE — Progress Notes (Signed)
Annice Pih, can you schedule him with Dr. [name] and I can call him to let him know. Thanks

## 2013-04-03 ENCOUNTER — Ambulatory Visit (INDEPENDENT_AMBULATORY_CARE_PROVIDER_SITE_OTHER): Payer: Medicaid Other | Admitting: Infectious Diseases

## 2013-04-03 ENCOUNTER — Encounter: Payer: Self-pay | Admitting: Infectious Diseases

## 2013-04-03 VITALS — BP 108/74 | HR 79 | Temp 97.7°F | Wt 107.0 lb

## 2013-04-03 DIAGNOSIS — B20 Human immunodeficiency virus [HIV] disease: Secondary | ICD-10-CM

## 2013-04-03 DIAGNOSIS — B392 Pulmonary histoplasmosis capsulati, unspecified: Secondary | ICD-10-CM

## 2013-04-03 NOTE — Progress Notes (Signed)
  Subjective:    Patient ID: Eric Jefferson, male    DOB: December 02, 1960, 52 y.o.   MRN: [id number]  HPI 52 yo M with HIV/AIDS, previously treated TB, adm to [facility] 2-23 to 3-1 with worsening SOB and nodular lung disease. He underwent BAL on 2-24 negative for PCP, respiratory viruses, Fungus, TB, legionella.  He was treated empirically for PCP and CAP (bactrim and LVQ respectively). His BAL grew histo and he was started on itraconazole.  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 He has been changed to stribilid.  Today complains of continued cough. No fever or chills. Has had stuffy nose, dry cough.  States he has been taking his stribilid with soda, or with water sometimes. Not clear about his itra (interpreter present throughout exam).   Review of Systems  Constitutional: Negative for appetite change and unexpected weight change.  Respiratory: Positive for cough.        Objective:   Physical Exam  Constitutional:    HENT:  Mouth/Throat: No oropharyngeal exudate.  Eyes: EOM are normal. Pupils are equal, round, and reactive to light.  Neck: Neck supple.  Cardiovascular: Normal rate, regular rhythm and normal heart sounds.   Pulmonary/Chest: Effort normal and breath sounds normal.  Abdominal: Soft. Bowel sounds are normal. He exhibits no distension. There is no tenderness.  Lymphadenopathy:    He has no cervical adenopathy.          Assessment & Plan:

## 2013-04-03 NOTE — Assessment & Plan Note (Signed)
I went over his medication and need to take it with carbonated, acidic beverage, at last 3 times with the interpreter and the patient while he was in the clinic. Will see him back in 1 month and recheck his labs.

## 2013-04-03 NOTE — Assessment & Plan Note (Signed)
I went over his medication and need to take it with food, at last 3 times with the interpreter and the patient while he was in the clinic. Will see him back in 1 month and recheck his labs.

## 2013-04-04 ENCOUNTER — Encounter: Payer: Self-pay | Admitting: Infectious Diseases

## 2013-05-06 ENCOUNTER — Encounter: Payer: Self-pay | Admitting: Infectious Diseases

## 2013-05-06 ENCOUNTER — Ambulatory Visit (INDEPENDENT_AMBULATORY_CARE_PROVIDER_SITE_OTHER): Payer: Medicaid Other | Admitting: Infectious Diseases

## 2013-05-06 VITALS — BP 108/75 | HR 77 | Temp 98.1°F | Wt 111.0 lb

## 2013-05-06 DIAGNOSIS — B20 Human immunodeficiency virus [HIV] disease: Secondary | ICD-10-CM

## 2013-05-06 DIAGNOSIS — B392 Pulmonary histoplasmosis capsulati, unspecified: Secondary | ICD-10-CM

## 2013-05-06 MED ORDER — DARUNAVIR ETHANOLATE 800 MG PO TABS: 800.0000 mg | ORAL_TABLET | Freq: Every day | ORAL | Status: DC

## 2013-05-06 NOTE — Assessment & Plan Note (Addendum)
Discussed with pharm- will add prezista to see if we can get him to undetectable. Being on stribilid his Cobi will boost so will not need to add RTV. Will see him back in 1 month for labs and 6 weeks for visit.

## 2013-05-06 NOTE — Addendum Note (Signed)
Addended by: HATCHER, JEFFREY C on: 05/06/2013 09:58 AM   Modules accepted: Orders

## 2013-05-06 NOTE — Assessment & Plan Note (Signed)
Will check his Itra blood level. He has been on itra since 3-20 at this point. Will consider d/c if he has any detectable level at all...Marland KitchenMarland Kitchen

## 2013-05-06 NOTE — Progress Notes (Signed)
  Subjective:    Patient ID: Eric Jefferson, male    DOB: 07-26-1961, 52 y.o.   MRN: [id number]  HPI 52 yo M with HIV/AIDS, previously treated TB, adm to [facility] 2-23 to 12-29-12 with worsening SOB and nodular lung disease. He underwent BAL on 2-24 negative for PCP, respiratory viruses, Fungus, TB, legionella.  He was treated empirically for PCP and CAP (bactrim and LVQ respectively). His BAL grew histo and he was started on itraconazole.  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 He has been changed to stribilid.   HIV 1 RNA Quant (copies/mL)  Date Value  03/06/2013 101306*  02/20/2013 85957*  12/27/2012 409811*     CD4 T Cell Abs (cmm)  Date Value  02/20/2013 280*  12/27/2012 90*  12/05/2012 230*   No missed doses of meds. Denies any problems today. No SOB, still has cough but has decreased vs previous. No f/c. Wt up 2# from previous  Review of Systems     Objective:   Physical Exam  Constitutional: He appears well-developed and well-nourished.  HENT:  Mouth/Throat: No oropharyngeal exudate.  Eyes: EOM are normal. Pupils are equal, round, and reactive to light.  Neck: Neck supple.  Cardiovascular: Normal rate, regular rhythm and normal heart sounds.   Pulmonary/Chest: Effort normal and breath sounds normal.  Abdominal: Soft. Bowel sounds are normal. He exhibits no distension.  Lymphadenopathy:    He has no cervical adenopathy.          Assessment & Plan:

## 2013-05-24 ENCOUNTER — Emergency Department (INDEPENDENT_AMBULATORY_CARE_PROVIDER_SITE_OTHER): Payer: Medicaid Other

## 2013-05-24 ENCOUNTER — Emergency Department (INDEPENDENT_AMBULATORY_CARE_PROVIDER_SITE_OTHER)
Admission: EM | Admit: 2013-05-24 | Discharge: 2013-05-24 | Disposition: A | Discharge status: Home / Self Care | Payer: Medicaid Other | Source: Home / Self Care | Attending: Emergency Medicine | Admitting: Emergency Medicine

## 2013-05-24 ENCOUNTER — Encounter (HOSPITAL_COMMUNITY): Payer: Self-pay | Admitting: Emergency Medicine

## 2013-05-24 DIAGNOSIS — R109 Unspecified abdominal pain: Secondary | ICD-10-CM

## 2013-05-24 DIAGNOSIS — M7918 Myalgia, other site: Secondary | ICD-10-CM

## 2013-05-24 LAB — POCT URINALYSIS DIP (DEVICE)
Ketones, ur: NEGATIVE mg/dL
Protein, ur: NEGATIVE mg/dL
Specific Gravity, Urine: >=1.03 (ref 1.005–1.030)

## 2013-05-24 MED ORDER — OXYCODONE-ACETAMINOPHEN 5-325 MG PO TABS: ORAL_TABLET | ORAL | Status: DC

## 2013-05-24 MED ORDER — METHOCARBAMOL 500 MG PO TABS: 500.0000 mg | ORAL_TABLET | Freq: Three times a day (TID) | ORAL | Status: DC

## 2013-05-24 NOTE — ED Provider Notes (Signed)
Chief Complaint:   Chief Complaint  Patient presents with  . Flank Pain    History of Present Illness:    Eric Jefferson is a 52 year old [place] male with a history of HIV infection, treated, tuberculosis, histoplasmosis, and diabetes. He presents tonight with a four-day history of pain in the right flank area. This occurred after lifting a heavy weight. The pain comes and goes and is rated 8/10 in intensity. It's worse when lifting and better with rest. He denies fever, chills, anorexia, weight loss, nausea, or vomiting. He's had no chest pain or shortness of breath. The pain is slightly worse with deep inspiration. He denies any constipation, diarrhea, blood in the stool. He's had no urinary symptoms. No skin rash. His last T cell count was 280 on 02/20/2013, and his last viral load was 101,306 on that same day. He is on Prezista andStribild for his HIV infection and Sporanox for histoplasmosis. He was hospitalized in February and March for pulmonary symptoms and that was when the diagnosis of histoplasmosis was made. He denies any coughing, wheezing, or shortness of breath.  Review of Systems:  Other than noted above, the patient denies any of the following symptoms: Constitutional:  No fever, chills, fatigue, weight loss or anorexia. Lungs:  No cough or shortness of breath. Heart:  No chest pain, palpitations, syncope or edema.  No cardiac history. Abdomen:  No nausea, vomiting, hematememesis, melena, diarrhea, or hematochezia. GU:  No dysuria, frequency, urgency, or hematuria.  No testicular pain or swelling.  PMFSH:  Past medical history, family history, social history, meds, and allergies were reviewed along with nurse's notes.  No prior abdominal surgeries or history of GI problems.  No use of NSAIDs or aspirin.  No excessive  alcohol intake.   Physical Exam:   Vital signs:  BP 126/75  Pulse 65  Temp(Src) 97.9 F (36.6 C) (Oral)  Resp 16  SpO2 99% Gen:  Alert, oriented, in no  distress. Lungs:  Breath sounds clear and equal bilaterally.  No wheezes, rales or rhonchi. Heart:  Regular rhythm.  No gallops or murmers.   Abdomen:  Abdomen is soft, flat, nondistended. He has mild tenderness to palpation in the right upper quadrant, right flank, right lower quadrant without guarding or rebound. There is no organomegaly or mass. Murphy sign and Murphy's punch were negative. Bowel sounds are normally active. There is no CVA tenderness. Skin:  Clear, warm and dry.  No rash.  Labs:   Results for orders placed during the hospital encounter of 05/24/13  POCT URINALYSIS DIP (DEVICE)      Result Value Range   Glucose, UA NEGATIVE  NEGATIVE mg/dL   Bilirubin Urine NEGATIVE  NEGATIVE   Ketones, ur NEGATIVE  NEGATIVE mg/dL   Specific Gravity, Urine >=1.030  1.005 - 1.030   Hgb urine dipstick NEGATIVE  NEGATIVE   pH 5.5  5.0 - 8.0   Protein, ur NEGATIVE  NEGATIVE mg/dL   Urobilinogen, UA 0.2  0.0 - 1.0 mg/dL   Nitrite NEGATIVE  NEGATIVE   Leukocytes, UA NEGATIVE  NEGATIVE     Radiology:  Dg Abd Acute W/chest  05/24/2013   *RADIOLOGY REPORT*  Clinical Data: Right-sided flank pain for 4 days  ACUTE ABDOMEN SERIES (ABDOMEN 2 VIEW & CHEST 1 VIEW)  Comparison: Chest radiograph - 03/06/2013; 12/05/2012; 01/08/2008; chest CT - 01/08/2008; CT abdomen pelvis - 05/21/2012  Findings: Grossly unchanged and enlarged cardiac silhouette and mediastinal contours.  There is persistent ill-defined interstitial nodular thickening  primarily involving the bilateral upper lungs, left greater than right, grossly unchanged.  No new focal airspace opacities.  No pleural effusion or pneumothorax.  No definite evidence of edema.  Nonobstructive bowel gas pattern.  No pneumoperitoneum, pneumatosis or portal venous gas.  No definite abnormal intra-abdominal calcifications.  Mild scoliotic curvature of the thoracolumbar spine, possibly positional.  No acute osseous abnormalities.  IMPRESSION:  1.  Persistent  findings of presumed disseminated tuberculosis, grossly unchanged. No definite superimposed acute cardiopulmonary disease. 2.  Nonobstructive bowel gas pattern.  No definite evidence of nephrolithiasis.   Original Report Authenticated By: Tacey Ruiz, MD   Assessment:  The encounter diagnosis was Muscular abdominal pain in right flank.  There no signs of acute surgical abdomen. Differential diagnosis includes musculoskeletal pain, gallbladder disease, Crohn's disease, ulcer, or kidney stone. Right now I favor musculoskeletal pain since this came on after heavy lifting and seems to be related to activity and position. I spoke with Dr. Orvan Falconer. He did not think this was related to his HIV infection. He stated that he would see him next week in the infectious disease clinic.  Plan:   1.  The following meds were prescribed:   Discharge Medication List as of 05/24/2013  7:00 PM    START taking these medications   Details  methocarbamol (ROBAXIN) 500 MG tablet Take 1 tablet (500 mg total) by mouth 3 (three) times daily., Starting 05/24/2013, Until Discontinued, Normal    oxyCODONE-acetaminophen (PERCOCET) 5-325 MG per tablet 1 to 2 tablets every 6 hours as needed for pain., Print       2.  The patient was instructed in symptomatic care and handouts were given. 3.  The patient was told to return if becoming worse in any way, if no better in 3 or 4 days, and given some red flag symptoms such as worsening pain, fever, or persistent vomiting that would indicate earlier return. 4.  Follow up at the infectious disease clinic next week.     Reuben Likes, MD 05/24/13 2138

## 2013-05-24 NOTE — ED Notes (Signed)
Reports right flank pain since Tuesday. Denies injury.  States the pain radiates around to the back.  Pt has not taking any meds for relief.

## 2013-06-05 ENCOUNTER — Other Ambulatory Visit: Payer: Medicaid Other

## 2013-06-05 ENCOUNTER — Other Ambulatory Visit (HOSPITAL_COMMUNITY)
Admission: RE | Admit: 2013-06-05 | Discharge: 2013-06-05 | Disposition: A | Discharge status: Home / Self Care | Payer: Medicaid Other | Source: Ambulatory Visit | Attending: Internal Medicine | Admitting: Internal Medicine

## 2013-06-05 DIAGNOSIS — B20 Human immunodeficiency virus [HIV] disease: Secondary | ICD-10-CM

## 2013-06-05 DIAGNOSIS — Z113 Encounter for screening for infections with a predominantly sexual mode of transmission: Secondary | ICD-10-CM

## 2013-06-06 LAB — T-HELPER CELL (CD4) - (RCID CLINIC ONLY): CD4 % Helper T Cell: 12 % — ABNORMAL LOW (ref 33–55)

## 2013-06-07 LAB — HIV-1 RNA QUANT-NO REFLEX-BLD: HIV 1 RNA Quant: 80 copies/mL — ABNORMAL HIGH (ref <20)

## 2013-06-24 ENCOUNTER — Ambulatory Visit (INDEPENDENT_AMBULATORY_CARE_PROVIDER_SITE_OTHER): Payer: Medicaid Other | Admitting: Infectious Diseases

## 2013-06-24 ENCOUNTER — Encounter: Payer: Self-pay | Admitting: Infectious Diseases

## 2013-06-24 VITALS — BP 112/76 | HR 48 | Temp 97.8°F | Ht 64.0 in | Wt 106.5 lb

## 2013-06-24 DIAGNOSIS — Z23 Encounter for immunization: Secondary | ICD-10-CM

## 2013-06-24 DIAGNOSIS — Z79899 Other long term (current) drug therapy: Secondary | ICD-10-CM

## 2013-06-24 DIAGNOSIS — B20 Human immunodeficiency virus [HIV] disease: Secondary | ICD-10-CM

## 2013-06-24 DIAGNOSIS — B392 Pulmonary histoplasmosis capsulati, unspecified: Secondary | ICD-10-CM

## 2013-06-24 DIAGNOSIS — Z113 Encounter for screening for infections with a predominantly sexual mode of transmission: Secondary | ICD-10-CM

## 2013-06-24 MED ORDER — HYDROCOD POLST-CHLORPHEN POLST 10-8 MG/5ML PO LQCR: 5.0000 mL | Freq: Two times a day (BID) | ORAL | Status: DC | PRN

## 2013-06-24 NOTE — Progress Notes (Signed)
  Subjective:    Patient ID: Eric Jefferson, male    DOB: 1961/01/02, 52 y.o.   MRN: [id number]  HPI 52 yo [place] M with HIV/AIDS, previously treated TB, adm to [facility] 2-23 to 12-29-12 with worsening SOB and nodular lung disease. His BAL grew histo and he was started on itraconazole.  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 He has been changed to stribilid. He had persistently elevated HIV RNA and DRV was added to his stribilid.  He has had several itraconazole blood levels, all undetectable.  Wt down 5# since July. No problems with medications. Mild AM cough, SOB is better. Has been taking Itra. Would like refill of medication for cough.   HIV 1 RNA Quant (copies/mL)  Date Value  06/05/2013 80*  03/06/2013 101306*  02/20/2013 40981*     CD4 T Cell Abs (cmm)  Date Value  06/05/2013 430   02/20/2013 280*  12/27/2012 90*     Review of Systems     Objective:   Physical Exam  Constitutional: He appears well-developed and well-nourished.  HENT:  Mouth/Throat: No oropharyngeal exudate.  Eyes: EOM are normal. Pupils are equal, round, and reactive to light.  Neck: Neck supple.  Cardiovascular: Normal rate, regular rhythm and normal heart sounds.   Pulmonary/Chest: Effort normal and breath sounds normal.  Abdominal: Soft. Bowel sounds are normal. He exhibits no distension. There is no tenderness.  Lymphadenopathy:    He has no cervical adenopathy.          Assessment & Plan:

## 2013-06-24 NOTE — Assessment & Plan Note (Signed)
Very difficult to know if this has been adequately treated with his (-) itra levels. Will plan on stopping his rx in 4 months if his CD4 remain high, VL low.

## 2013-06-24 NOTE — Assessment & Plan Note (Addendum)
He is doing much better. Will continue his current ART. He is given condoms. Will see him back in 4 months with labs. He gets flu shot today. Cannot sign him up for MyChart as he does not speak english. Info is given to pt so his son can try this with him (he son reads english).

## 2013-09-13 ENCOUNTER — Telehealth: Payer: Self-pay | Admitting: Family Medicine

## 2013-09-13 NOTE — Telephone Encounter (Signed)
Enter in error

## 2013-10-21 ENCOUNTER — Other Ambulatory Visit: Payer: Medicaid Other

## 2013-10-21 DIAGNOSIS — Z113 Encounter for screening for infections with a predominantly sexual mode of transmission: Secondary | ICD-10-CM

## 2013-10-21 DIAGNOSIS — B20 Human immunodeficiency virus [HIV] disease: Secondary | ICD-10-CM

## 2013-10-21 DIAGNOSIS — Z79899 Other long term (current) drug therapy: Secondary | ICD-10-CM

## 2013-10-21 DIAGNOSIS — B392 Pulmonary histoplasmosis capsulati, unspecified: Secondary | ICD-10-CM

## 2013-10-21 LAB — COMPLETE METABOLIC PANEL WITH GFR
ALT: 13 U/L (ref 0–53)
AST: 27 U/L (ref 0–37)
Albumin: 3.7 g/dL (ref 3.5–5.2)
Calcium: 8.7 mg/dL (ref 8.4–10.5)
Chloride: 104 mEq/L (ref 96–112)
Potassium: 4.7 mEq/L (ref 3.5–5.3)
Sodium: 140 mEq/L (ref 135–145)

## 2013-10-21 LAB — RPR

## 2013-10-21 LAB — LIPID PANEL
HDL: 32 mg/dL — ABNORMAL LOW (ref >39)
LDL Cholesterol: 106 mg/dL — ABNORMAL HIGH (ref 0–99)
Total CHOL/HDL Ratio: 5.2 Ratio

## 2013-10-21 LAB — CBC WITH DIFFERENTIAL/PLATELET
Basophils Absolute: 0 10*3/uL (ref 0.0–0.1)
HCT: 37.5 % — ABNORMAL LOW (ref 39.0–52.0)
Lymphocytes Relative: 36 % (ref 12–46)
Monocytes Absolute: 0.9 10*3/uL (ref 0.1–1.0)
Neutro Abs: 3.8 10*3/uL (ref 1.7–7.7)
Platelets: 264 10*3/uL (ref 150–400)
RDW: 14.7 % (ref 11.5–15.5)
WBC: 7.5 10*3/uL (ref 4.0–10.5)

## 2013-10-22 LAB — T-HELPER CELL (CD4) - (RCID CLINIC ONLY): CD4 % Helper T Cell: 9 % — ABNORMAL LOW (ref 33–55)

## 2013-11-04 ENCOUNTER — Encounter: Payer: Self-pay | Admitting: Infectious Diseases

## 2013-11-04 ENCOUNTER — Ambulatory Visit (INDEPENDENT_AMBULATORY_CARE_PROVIDER_SITE_OTHER): Payer: Medicaid Other | Admitting: Infectious Diseases

## 2013-11-04 VITALS — BP 117/75 | HR 80 | Temp 98.1°F | Wt 102.0 lb

## 2013-11-04 DIAGNOSIS — B20 Human immunodeficiency virus [HIV] disease: Secondary | ICD-10-CM

## 2013-11-04 DIAGNOSIS — K029 Dental caries, unspecified: Secondary | ICD-10-CM

## 2013-11-04 DIAGNOSIS — B392 Pulmonary histoplasmosis capsulati, unspecified: Secondary | ICD-10-CM

## 2013-11-04 NOTE — Progress Notes (Signed)
   Subjective:    Patient ID: Eric Jefferson, male    DOB: 1961-09-11, 53 y.o.   MRN: [id number]  HPI 53 yo [place] M with HIV/AIDS, previously treated TB, adm to [facility] 2-23 to 12-29-12 with worsening SOB and nodular lung disease. His BAL grew histo and he was started on itraconazole.  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 He has been changed to stribilid. He had persistently elevated HIV RNA and DRV was added to his stribilid.  He has had several itraconazole blood levels, all undetectable. Has been off DRV but has been taking stribilid. He has been coughing and occas pleuritic and abd pain. Has been feeling weak. Has had occas chills.  [name] called pt and pt swore adherence. He denies any missed doses today in clinic but states that he did miss 4 days of medication around when he had blood drawn.   HIV 1 RNA Quant (copies/mL)  Date Value  10/21/2013 40172*  06/05/2013 80*  03/06/2013 101306*     CD4 T Cell Abs (/uL)  Date Value  10/21/2013 290*  06/05/2013 430   02/20/2013 280*     Review of Systems     Objective:   Physical Exam  Constitutional: He appears well-developed and well-nourished.  HENT:  Mouth/Throat: No oropharyngeal exudate.  Eyes: EOM are normal. Pupils are equal, round, and reactive to light.  Neck: Neck supple.  Cardiovascular: Normal rate, regular rhythm and normal heart sounds.   Pulmonary/Chest: Effort normal and breath sounds normal.  Abdominal: Soft. Bowel sounds are normal. There is no tenderness.  Lymphadenopathy:    He has no cervical adenopathy.          Assessment & Plan:

## 2013-11-04 NOTE — Assessment & Plan Note (Signed)
Going to dentist today.

## 2013-11-04 NOTE — Assessment & Plan Note (Signed)
Will stop his itra.

## 2013-11-04 NOTE — Assessment & Plan Note (Signed)
Hopefully his VL is due to his adherence problems. Will recheck his CD4 and VL today. Will see him back in 3 months.

## 2013-11-05 LAB — T-HELPER CELL (CD4) - (RCID CLINIC ONLY)
CD4 % Helper T Cell: 14 % — ABNORMAL LOW (ref 33–55)
CD4 T Cell Abs: 410 /uL (ref 400–2700)

## 2013-11-05 NOTE — Progress Notes (Signed)
I called Eric Jefferson to tell him to stop taking itraconazole. He understand.

## 2013-11-06 LAB — HIV-1 RNA ULTRAQUANT REFLEX TO GENTYP+
HIV 1 RNA QUANT: 89 {copies}/mL — AB (ref <20)
HIV-1 RNA Quant, Log: 1.95 {Log} — ABNORMAL HIGH (ref <1.30)

## 2013-11-21 ENCOUNTER — Emergency Department (HOSPITAL_COMMUNITY)
Admission: EM | Admit: 2013-11-21 | Discharge: 2013-11-21 | Disposition: A | Discharge status: Home / Self Care | Payer: Medicaid Other | Source: Home / Self Care

## 2013-11-21 ENCOUNTER — Emergency Department (INDEPENDENT_AMBULATORY_CARE_PROVIDER_SITE_OTHER): Payer: Medicaid Other

## 2013-11-21 ENCOUNTER — Encounter (HOSPITAL_COMMUNITY): Payer: Self-pay | Admitting: Emergency Medicine

## 2013-11-21 DIAGNOSIS — J069 Acute upper respiratory infection, unspecified: Secondary | ICD-10-CM

## 2013-11-21 DIAGNOSIS — R0789 Other chest pain: Secondary | ICD-10-CM

## 2013-11-21 DIAGNOSIS — R071 Chest pain on breathing: Secondary | ICD-10-CM

## 2013-11-21 NOTE — Discharge Instructions (Signed)
Nhi?m Trùng ???ng Hô H?p Trên, Ng??i L?n °(Upper Respiratory Infection, Adult) °Nhi?m trùng ???ng hô h?p trên (URI) ?ôi khi còn ???c g?i là c?m l?nh thông th??ng. ???ng hô h?p trên bao g?m m?i, xoang, h?ng, khí qu?n và ph? qu?n. Ph? qu?n là ???ng hô h?p d?n ??n ph?i. H?u h?t m?i ng??i c?i thi?n trong vòng 1 tu?n, nh?ng các tri?u ch?ng có th? kéo dài ??n 2 tu?n. Ho còn l?i có th? kéo dài th?m chí lâu h?n.  °NGUYÊN NHÂN °Nhi?u vi rút khác nhau có th? lây nhi?m các mô lót ???ng hô h?p trên. Các mô này tr? nên b? kích thích, viêm và th??ng tr? nên r?t ?m ??t. S? s?n sinh ch?t nh?n c?ng là ph? bi?n. C?m l?nh d? lây. B?n có th? d? dàng lây vi rút cho ng??i khác khi ti?p xúc b?ng mi?ng. ?i?u này bao g?m hôn, dùng chung ly, ho ho?c h?t h?i. Ch?m vào mi?ng ho?c m?i b?n và sau ?ó ch?m vào m?t b? m?t, sau ?ó ng??i khác ch?m vào b? m?t này c?ng có th? b? lây vi rút. °TRI?U CH?NG °Các tri?u ch?ng th??ng phát tri?n 1-3 ngày sau khi b?n ti?p xúc v?i vi rút c?m l?nh. Tri?u ch?ng ? m?i ng??i m?t khác. Các tri?u ch?ng có th? bao g?m: °· Ch?y n??c m?i. °· H?t h?i. °· Ngh?t m?i. °· Kích thích xoang. °· ?au h?ng. °· M?t ti?ng (viêm thanh qu?n). °· Ho. °· M?t m?i. °· ?au nh?c c?. °· ?n không ngon mi?ng. °· ?au ??u. °· S?t nh?. °CH?N ?OÁN °B?n có th? ch?n ?oán b?nh c?m l?nh c?a chính mình d?a vào nh?ng tri?u ch?ng quen thu?c, vì h?u h?t m?i ng??i b? c?m l?nh 2-3 l?n m?i n?m. Chuyên gia ch?m sóc s?c kh?e c?a b?n có th? xác nh?n ?i?u này sau khi khám b?n. Quan tr?ng nh?t, chuyên gia ch?m sóc y t? c?a b?n có th? ki?m tra và ??m b?o r?ng các tri?u ch?ng c?a b?n không ph?i do m?t b?nh khác nh? viêm h?ng, viêm xoang, viêm ph?i, hen suy?n ho?c viêm ti?u thi?t. Không c?n thi?t xét nghi?m máu, ki?m tra h?ng và ch?p X-quang ?? ch?n ?oán c?m l?nh thông th??ng, nh?ng ?ôi khi chúng có th? h?u ích trong vi?c lo?i tr? các b?nh khác nghiêm tr?ng h?n. Chuyên gia ch?m sóc s?c kh?e c?a b?n s? quy?t ??nh có c?n thêm các xét nghi?m khác không. °R?I RO VÀ BI?N  CH?NG °B?n có th? có nguy c? b? m?t tr??ng h?p nghiêm tr?ng h?n c?m l?nh thông th??ng n?u b?n hút thu?c lá, có b?nh tim m?n tính (nh? suy tim), ho?c b?nh ph?i (nh? hen suy?n), ho?c n?u b?n có h? th?ng mi?n d?ch suy y?u. Nh?ng ng??i r?t tr? và r?t già c?ng có nguy c? nhi?m trùng nghiêm tr?ng h?n. Viêm xoang do vi khu?n, nhi?m trùng tai gi?a, viêm ph?i do vi khu?n có th? khi?n c?m l?nh thông th??ng ph?c t?p thêm. C?m l?nh thông th??ng có th? làm tr?m tr?ng thêm b?nh hen suy?n và t?c ngh?n ph?i m?n tính (COPD). ?ôi khi, nh?ng bi?n ch?ng này có th? yêu c?u ch?m sóc y t? kh?n c?p và có th? ?e d?a tính m?ng. °PHÒNG NG?A °Cách t?t nh?t ?? b?o v? tránh c?m l?nh là th?c hành v? sinh t?t. Tránh ti?p xúc b?ng mi?ng ho?c b?ng tay v?i nh?ng ng??i có tri?u ch?ng c?m l?nh. R?a tay th??ng xuyên n?u ti?p xúc x?y ra. Không có b?ng ch?ng rõ ràng r?ng   vitamin C, vitamin E, Echinacea ho?c t?p th? d?c làm gi?m kh? n?ng phát tri?n c?m l?nh. Tuy nhiên, b?n luôn nên ngh? ng?i nhi?u và có ch? ?? dinh d??ng t?t. °?I?U TR? °?i?u tr? nh?m gi?m tri?u ch?ng. Không có thu?c ch?a. Kháng sinh không hi?u qu?, vì nhi?m trùng do vi rút gây ra, ch? không ph?i do vi khu?n. ?i?u tr? có th? bao g?m: °· U?ng nhi?u n??c h?n. ?? u?ng th? thao cung c?p ch?t ?i?n gi?i, ???ng và các ch?t l?ng có giá tr?. °· Th? s??ng mù n??c nóng ho?c h?i n??c (bình phun h?i ho?c vòi sen). °· ?n súp gà ho?c n??c canh trong khác và duy trì ch? ?? dinh d??ng t?t. °· Ngh? ng?i th?t nhi?u. °· S? d?ng thu?c súc mi?ng ho?c thu?c u?ng ?? t?o c?m giác tho?i mái. °· Ki?m soát s?t b?ng ibuprofen ho?c acetaminophen theo ch? d?n c?a chuyên gia ch?m sóc s?c kh?e. °· T?ng c??ng s? d?ng thu?c d?ng hít n?u b?n b? b?nh hen suy?n. °S? d?ng gel k?m và k?o dòn k?m trong 24 gi? ??u tiên sau khi b? c?m l?nh thông th??ng có th? rút ng?n th?i gian và làm gi?m b?t m?c ?? nghiêm tr?ng c?a các tri?u ch?ng. Thu?c gi?m ?au có th? có tác d?ng v?i s?t, ?au nh?c c? b?p và ?au c? h?ng. Nhi?u lo?i thu?c không  c?n kê ??n có s?n ?? ?i?u tr? t?c ngh?n và ch?y n??c m?i. Chuyên gia ch?m sóc s?c kh?e c?a b?n có th? ??a ra các khuy?n cáo và có th? ?? ngh? s? d?ng thu?c x?t m?i ho?c ph?i cho các tri?u ch?ng khác. °H??NG D?N CH?M SÓC T?I NHÀ °· Ch? s? d?ng thu?c không c?n kê toa ho?c thu?c c?n kê toa ?? gi?m ?au, gi?m c?m giác khó ch?u ho?c h? s?t theo ch? d?n c?a chuyên gia ch?m sóc s?c kh?e c?a b?n. °· S? d?ng máy t?o ?? ?m s??ng mù ?m ho?c hít h?i n??c t? m?t vòi sen ?? t?ng ?? ?m không khí. ?i?u này có th? ti?t ?m và giúp d? th? h?n. °· U?ng ?? n??c và dung d?ch ?? n??c ti?u trong ho?c có màu vàng nh?t. °· Ngh? ng?i theo nhu c?u. °· Tr? l?i làm vi?c khi nhi?t ?? c?a b?n ?ã tr? l?i bình th??ng ho?c chuyên gia ch?m sóc y t? khuyên b?n làm nh? v?y. B?n có th? c?n ? nhà lâu h?n ?? tránh lây nhi?m cho ng??i khác. B?n c?ng có th? s? d?ng m?t n? và r?a tay c?n th?n ?? ng?n ng?a lây lan vi rút. °HÃY ?I KHÁM N?U: °· Sau vài ngày ??u tiên, b?n c?m th?y t? h?n ch? không ph?i là t?t h?n. °· B?n c?n l?i khuyên c?a chuyên gia ch?m sóc s?c kh?e v? thu?c ?? ki?m soát các tri?u ch?ng. °· B?n b? ?n l?nh, khó th? tr?m tr?ng h?n ho?c ??m màu nâu ho?c ??. ?ây có th? là nh?ng d?u hi?u c?a viêm ph?i. °· B?n b? ch?y n??c m?i màu vàng ho?c nâu ho?c ?au ? m?t, ??c bi?t là khi b?n u?n cong v? phía tr??c. ?ây có th? là nh?ng d?u hi?u c?a viêm xoang. °· B?n b? s?t, s?ng h?ch c?, ?au khi nu?t ho?c các vùng màu tr?ng ? m?t sau c?a c? h?ng. ?ây có th? là nh?ng d?u hi?u c?a viêm h?ng. °HÃY NGAY L?P T?C ?I KHÁM N?U: °· B?n b? s?t. °· B?n b? nh?c ??u d? d?i ho?c dai d?ng, ?au tai, ?au   xoang ho?c ?au ng?c.  B?n b? th? kh kh, ho ko di, ho ra mu ho?c c s? thay ??i trong ch?t nh?n thng th??ng c?a b?n (n?u b?n b? b?nh ph?i m?n tnh).  B?n b? ?au c? ho?c c? c?ng. Document Released: 05/02/2011 Document Revised: 06/19/2013 [facility] Patient Information 2014 [place], [place].  Vim s?n s??n (Costochondritis) Vim s?n s??n, ?i khi ???c g?i l h?i ch?ng  Tietze, l tnh tr?ng s?ng v kch ?ng (vim) cc m (s?n) n?i x??ng s??n v?i x??ng ng?c (x??ng ?c). N gy ?au ? ng?c v khu v?c x??ng s??n. Vim s?n s??n th??ng t? kh?i sau m?t th?i gian. C th? m?t t?i 6 tu?n ho?c lu h?n ?? b?nh ?? h?n, ??c bi?t l n?u qu v? khng th? h?n ch? cc ho?t ??ng c?a mnh. NGUYN NHN  M?t s? tr??ng h?p vim s?n s??n khng r nguyn nhn. Nh?ng nguyn nhn c th? c bao g?m:  T?n th??ng (ch?n th??ng).  T?p th? d?c ho?c ho?t ??ng ch?ng h?n nh? nng nh?c.  Ho d? d?i. D?U HI?U V TRI?U CH?NG  ?au v c?m gic ?au ? ng?c v khu v?c x??ng s??n.  ?au n?ng h?n khi ho ho?c th? su.  ?au n?ng h?n khi c cc c? ??ng ring bi?t. CH?N ?ON  Chuyn gia ch?m [place] s?c kh?e c?a qu v? s? khm th?c th? v h?i v? nh?ng tri?u ch?ng c?a qu v?. C th? ch?p X quang l?ng ng?c ho?c cc xt nghi?m khc ?? lo?i tr? nh?ng v?n ?? khc. ?I?U TR?  Vim s?n s??n th??ng t? kh?i sau m?t th?i gian. Chuyn gia ch?m [place] s?c kh?e c th? k ??n thu?c ?? gi?m ?au. H??NG D?N CH?M [place] T?I NH   Trnh ho?t ??ng th? l?c g?ng s?c. C? g?ng khng ko c?ng x??ng s??n trong khi ho?t ??ng bnh th??ng. ?i?u ny c th? bao g?m b?t k? ho?t ??ng no c s? d?ng c? ng?c, b?ng, v ? m?ng s??n, ??c bi?t l n?u s? d?ng cc v?t n?ng.  Ch??m ? vo vng b? ?nh h??ng trong 2 ngy ??u sau khi b?t ??u ?au.  Cho ? vo ti nh?a.  ?? kh?n t?m vo gi?a da v ti.  ?? ? l?nh trong kho?ng 20 pht, 2 - 3 l?n m?t ngy.  Ch? s? d?ng thu?c khng c?n k ??n ho?c thu?c c?n k ??n theo ch? d?n c?a chuyn gia ch?m  s?c kh?e. ?I KHM N?U:  Qu v? b? t?y ?? ho?c s?ng ? cc kh?p x??ng s??n. ?y l nh?ng d?u hi?u nhi?m trng.  C?n ?au c?a qu v? khng h?t m?c d ? ngh? ng?i ho?c dng thu?c. NGAY L?P T?C ?I KHM N?U:   C?n ?au c?a qu v? t?ng ln ho?c c?m th?y r?t kh ch?u.  Qu v? b? th? d?c ho?c kh th?.  Qu v? ho ra mu.  Qu v? b? ?au ng?c n?ng h?n, ?? m? hi, ho?c nn m?a.  Qu v? b? s?t ho?c cc tri?u  ch?ng ko di h?n 2 - 3 ngy.  Qu v? b? s?t v cc tri?u ch?ng c?a qu v? ??t nhin n?ng ln. ??M B?O QU V?:   Hi?u cc h??ng d?n ny.  S? theo di tnh tr?ng c?a mnh.  S? yu c?u tr? gip ngay l?p t?c n?u b?n c?m th?y khng kh?e ho?c th?y tr?m tr?ng h?n. Document Released: 07/27/2005 Document Revised: 08/07/2013 [facility] Patient Information 2014 [place], [place].

## 2013-11-21 NOTE — ED Notes (Signed)
Pt c/o cold sx that include productive cough that has caused chest pain, congestion, and headache. Pain is 9/10. Denies any n/v/d. Written by: Lenore Manner, SMA 10

## 2013-11-21 NOTE — ED Provider Notes (Signed)
Medical screening examination/treatment/procedure(s) were performed by non-physician practitioner and as supervising physician I was immediately available for consultation/collaboration.  Philipp Deputy, M.D.   Harden Mo, MD 11/21/13 [phone]

## 2013-11-21 NOTE — ED Provider Notes (Signed)
CSN: [id number]     Arrival date & time 11/21/13  1041 History   First MD Initiated Contact with Patient 11/21/13 1120     Chief Complaint  Patient presents with  . URI   (Consider location/radiation/quality/duration/timing/severity/associated sxs/prior Treatment) HPI Comments: 53 year old [place] man with history of HIV, treated TB, diabetes and a 30-pack-year smoker presents with complaints of a cough for one week and chest pain for 2 days. He also has nasal congestion and PND. There is some question as to whether he may have had fever she has felt hot and cold.   Past Medical History  Diagnosis Date  . HIV (human immunodeficiency virus infection)   . TB (pulmonary tuberculosis)   . Diabetes    History reviewed. No pertinent past surgical history. History reviewed. No pertinent family history. History  Substance Use Topics  . Smoking status: Current Every Day Smoker -- 1.00 packs/day for 30 years    Types: Cigarettes  . Smokeless tobacco: Never Used  . Alcohol Use: No     Comment: former    Review of Systems  Constitutional: Positive for fever and activity change.  HENT: Positive for congestion, postnasal drip and rhinorrhea. Negative for sore throat.   Respiratory: Positive for cough. Negative for shortness of breath.   Cardiovascular:       Left lateral chest wall pain.  Gastrointestinal: Negative.   Genitourinary: Negative.   Musculoskeletal: Negative for back pain and neck pain.  Skin: Negative.   Neurological: Negative.     Allergies  Review of patient's allergies indicates no known allergies.  Home Medications   Current Outpatient Rx  Name  Route  Sig  Dispense  Refill  . darunavir (PREZISTA) 800 MG tablet   Oral   Take 1 tablet (800 mg total) by mouth daily.   60 tablet   11   . elvitegravir-cobicistat-emtricitabine-tenofovir (STRIBILD) 150-150-200-300 MG TABS   Oral   Take 1 tablet by mouth daily with breakfast.   30 tablet   11     Patient  speaks [place]   . chlorpheniramine-HYDROcodone (TUSSIONEX PENNKINETIC ER) 10-8 MG/5ML LQCR   Oral   Take 5 mLs by mouth every 12 (twelve) hours as needed.   140 mL   0   . dicyclomine (BENTYL) 20 MG tablet   Oral   Take 1 tablet (20 mg total) by mouth 2 (two) times daily.   60 tablet   0   . feeding supplement (ENSURE COMPLETE) LIQD   Oral   Take 237 mLs by mouth 3 (three) times daily between meals.   90 Bottle   0    BP 113/82  Pulse 111  Temp(Src) 97.8 F (36.6 C) (Oral)  Resp 20  SpO2 95% Physical Exam  Nursing note and vitals reviewed. Constitutional: He is oriented to person, place, and time. No distress.  Thin, nearly cachectic  HENT:  Right Ear: External ear normal.  Mouth/Throat: No oropharyngeal exudate.  Left TM is scarred and discolored. Pulse of following a landmarks and no erythema.  Oropharynx without erythema positive for clear PND.  Eyes: Conjunctivae and EOM are normal.  Neck: Normal range of motion. Neck supple.  Cardiovascular: Normal rate, regular rhythm and normal heart sounds.   Pulmonary/Chest: Effort normal and breath sounds normal. No respiratory distress. He has no wheezes. He has no rales. He exhibits tenderness.  Left lateral chest wall pain exacerbated by palpation.  Abdominal: Soft. Bowel sounds are normal. He exhibits no distension. There is no  tenderness.  Musculoskeletal: He exhibits no edema.  Lymphadenopathy:    He has no cervical adenopathy.  Neurological: He is alert and oriented to person, place, and time.  Skin: Skin is warm and dry.  Psychiatric: He has a normal mood and affect.    ED Course  Procedures (including critical care time) Labs Review Labs Reviewed - No data to display Imaging Review Dg Chest 2 View  11/21/2013   CLINICAL DATA:  Cough and fever.  HIV.  EXAM: CHEST  2 VIEW  COMPARISON:  03/06/2013  FINDINGS: Increased lung markings diffusely with some improvement in the interval. There appears to be an  element of underlying chronic lung disease. Patient may have had a previous lung infection previously. No lobar infiltrate or effusion on today's study. Lung volume is normal. Heart size is normal.  IMPRESSION: Prominent lung markings diffusely and bilaterally have improved in the interval most likely representing treatment of infection. No lobar infiltrate or effusion is seen currently.   Electronically Signed   By: Franchot Gallo M.D.   On: 11/21/2013 12:53      MDM   1. URI (upper respiratory infection)   2. Chest wall pain       Chest x-ray interpretation reveals improvement over the last one after treatment for infection. No active cardiopulmonary disease. The patient will be treated symptomatically with Alka Seltzer COld Plus Night time and Robitussin DM. Ibuprofen for pain. Return if worse, fever, dyspnea.   Janne Napoleon, NP 11/21/13 1315

## 2014-02-17 ENCOUNTER — Telehealth: Payer: Self-pay | Admitting: Family Medicine

## 2014-02-17 ENCOUNTER — Ambulatory Visit: Payer: Medicaid Other | Admitting: Infectious Diseases

## 2014-02-17 NOTE — Telephone Encounter (Signed)
Entered in error

## 2014-02-19 ENCOUNTER — Ambulatory Visit: Payer: Medicaid Other | Admitting: Infectious Diseases

## 2014-03-26 ENCOUNTER — Other Ambulatory Visit: Payer: Self-pay | Admitting: *Deleted

## 2014-03-26 DIAGNOSIS — B20 Human immunodeficiency virus [HIV] disease: Secondary | ICD-10-CM

## 2014-03-26 MED ORDER — ELVITEG-COBIC-EMTRICIT-TENOFDF 150-150-200-300 MG PO TABS: 1.0000 | ORAL_TABLET | Freq: Every day | ORAL | Status: DC

## 2014-05-15 ENCOUNTER — Telehealth: Payer: Self-pay | Admitting: *Deleted

## 2014-05-15 NOTE — Telephone Encounter (Signed)
Receive faxed ROI for attorney's office.  Attorney concerned that the pt may not understand the significance of his HIV diagnosis. Eric Jefferson is fluent in [place].)  Pt came to her office and verbalized that "my doctor has told me I need to stop working."  Per the Letter in [facility] written by Dr. [name] in January 2015, the pt was to be out-of-work for one week.  Pt c/o of "stomach problems" to the attorney and that was the reason he was asking for assistance with obtaining Social Security Disability.  Will place ROI in box from attorney to process records.

## 2015-02-04 ENCOUNTER — Telehealth: Payer: Self-pay | Admitting: *Deleted

## 2015-02-04 ENCOUNTER — Ambulatory Visit: Payer: Medicaid Other | Admitting: Infectious Diseases

## 2015-02-04 NOTE — Telephone Encounter (Signed)
Patient has not been seen in 15 months, no showed today.  Per pharmacy, patient has not picked up his prezista/stribild since April 2015.  RN spoke with [name], pharmacist, asked him to reach out to the patient.   Landis Gandy, RN

## 2015-02-09 ENCOUNTER — Encounter: Payer: Self-pay | Admitting: Infectious Diseases

## 2015-02-09 ENCOUNTER — Ambulatory Visit (INDEPENDENT_AMBULATORY_CARE_PROVIDER_SITE_OTHER): Payer: Medicaid Other | Admitting: Infectious Diseases

## 2015-02-09 VITALS — BP 110/74 | HR 66 | Temp 98.0°F | Ht 63.0 in | Wt 97.0 lb

## 2015-02-09 DIAGNOSIS — Z23 Encounter for immunization: Secondary | ICD-10-CM

## 2015-02-09 DIAGNOSIS — B20 Human immunodeficiency virus [HIV] disease: Secondary | ICD-10-CM

## 2015-02-09 DIAGNOSIS — Z113 Encounter for screening for infections with a predominantly sexual mode of transmission: Secondary | ICD-10-CM

## 2015-02-09 DIAGNOSIS — Z79899 Other long term (current) drug therapy: Secondary | ICD-10-CM | POA: Diagnosis not present

## 2015-02-09 DIAGNOSIS — R1013 Epigastric pain: Secondary | ICD-10-CM | POA: Diagnosis not present

## 2015-02-09 DIAGNOSIS — H6690 Otitis media, unspecified, unspecified ear: Secondary | ICD-10-CM | POA: Insufficient documentation

## 2015-02-09 DIAGNOSIS — H6500 Acute serous otitis media, unspecified ear: Secondary | ICD-10-CM

## 2015-02-09 DIAGNOSIS — H6592 Unspecified nonsuppurative otitis media, left ear: Secondary | ICD-10-CM | POA: Diagnosis not present

## 2015-02-09 DIAGNOSIS — R109 Unspecified abdominal pain: Secondary | ICD-10-CM | POA: Insufficient documentation

## 2015-02-09 DIAGNOSIS — R1033 Periumbilical pain: Secondary | ICD-10-CM | POA: Diagnosis not present

## 2015-02-09 MED ORDER — PANTOPRAZOLE SODIUM 40 MG PO TBEC: 40.0000 mg | DELAYED_RELEASE_TABLET | Freq: Every day | ORAL | Status: DC

## 2015-02-09 MED ORDER — LEVOFLOXACIN 500 MG PO TABS: 500.0000 mg | ORAL_TABLET | Freq: Every day | ORAL | Status: DC

## 2015-02-09 MED ORDER — PANTOPRAZOLE SODIUM 40 MG IV SOLR: 40.0000 mg | Freq: Every day | INTRAVENOUS | Status: DC

## 2015-02-09 NOTE — Progress Notes (Signed)
   Subjective:    Patient ID: Eric Jefferson, male    DOB: [birth year], 54 y.o.   MRN: [id number]  HPI 54 yo [place] M with HIV/AIDS, previously treated TB, adm to [facility] 2-23 to 12-29-12 with worsening SOB and nodular lung disease. His BAL grew histo and he was started on itraconazole.  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 He has been changed to stribilid. He had persistently elevated HIV RNA and DRV was added to his stribilid.  He has not been seen in ID clinic for last year as he felt he had completed all his rx's.  Has been having some occas abd pain. Worse with eating too much or having empty stomach.  Has been taking advil for ha but no other rx's. Just once a day, not every day.  L ear pain for last 4 days.   HIV 1 RNA QUANT (copies/mL)  Date Value  11/04/2013 89*  10/21/2013 40172*  06/05/2013 80*   CD4 T CELL ABS  Date Value  11/04/2013 410 /uL  10/21/2013 290 /uL*  06/05/2013 430 cmm    Review of Systems  Constitutional: Positive for appetite change. Negative for fever and chills.  Gastrointestinal: Positive for abdominal pain and constipation. Negative for diarrhea.  Genitourinary: Negative for difficulty urinating.      Objective:   Physical Exam  Constitutional: He appears well-developed and well-nourished. He appears cachectic.  HENT:  Ears:  Mouth/Throat: No oropharyngeal exudate.  Eyes: EOM are normal. Pupils are equal, round, and reactive to light.  Neck: Neck supple.  Cardiovascular: Normal rate, regular rhythm and normal heart sounds.   Pulmonary/Chest: Effort normal and breath sounds normal.  Abdominal: Soft. Bowel sounds are normal. There is no splenomegaly. There is tenderness in the periumbilical area. There is no rigidity, no rebound and no guarding.    Lymphadenopathy:    He has no cervical adenopathy.          Assessment & Plan:

## 2015-02-09 NOTE — Assessment & Plan Note (Signed)
Will give him PPI, hopefully something not nefarious.  Asked him to only take advil with food only Will see him back in 2 months.

## 2015-02-09 NOTE — Assessment & Plan Note (Signed)
Will give hm rx for levaquin.

## 2015-02-09 NOTE — Addendum Note (Signed)
Addended by: Landis Gandy on: 02/09/2015 05:15 PM   Modules accepted: Orders

## 2015-02-09 NOTE — Assessment & Plan Note (Addendum)
Will get him back on his ART He has condoms.  See him back in 2 months.  Needs flu and pnvx, needs to repeat Hep B series.

## 2015-02-24 ENCOUNTER — Other Ambulatory Visit: Payer: Self-pay | Admitting: Infectious Diseases

## 2015-04-24 ENCOUNTER — Other Ambulatory Visit: Payer: Self-pay | Admitting: Infectious Diseases

## 2015-05-11 ENCOUNTER — Other Ambulatory Visit: Payer: Medicaid Other

## 2015-05-11 DIAGNOSIS — Z79899 Other long term (current) drug therapy: Secondary | ICD-10-CM

## 2015-05-11 DIAGNOSIS — B20 Human immunodeficiency virus [HIV] disease: Secondary | ICD-10-CM

## 2015-05-11 DIAGNOSIS — Z113 Encounter for screening for infections with a predominantly sexual mode of transmission: Secondary | ICD-10-CM

## 2015-05-11 LAB — CBC
HCT: 36.1 % — ABNORMAL LOW (ref 39.0–52.0)
Hemoglobin: 12 g/dL — ABNORMAL LOW (ref 13.0–17.0)
MCH: 27.4 pg (ref 26.0–34.0)
MCHC: 33.2 g/dL (ref 30.0–36.0)
MCV: 82.4 fL (ref 78.0–100.0)
MPV: 9.8 fL (ref 8.6–12.4)
PLATELETS: 191 10*3/uL (ref 150–400)
RBC: 4.38 MIL/uL (ref 4.22–5.81)
RDW: 15.1 % (ref 11.5–15.5)
WBC: 5.9 10*3/uL (ref 4.0–10.5)

## 2015-05-11 LAB — COMPREHENSIVE METABOLIC PANEL
ALT: 10 U/L (ref 0–53)
AST: 23 U/L (ref 0–37)
Albumin: 3.7 g/dL (ref 3.5–5.2)
Alkaline Phosphatase: 82 U/L (ref 39–117)
BUN: 13 mg/dL (ref 6–23)
CALCIUM: 8.6 mg/dL (ref 8.4–10.5)
CO2: 24 mEq/L (ref 19–32)
CREATININE: 0.99 mg/dL (ref 0.50–1.35)
Chloride: 104 mEq/L (ref 96–112)
GLUCOSE: 76 mg/dL (ref 70–99)
POTASSIUM: 4.5 meq/L (ref 3.5–5.3)
Sodium: 139 mEq/L (ref 135–145)
Total Bilirubin: 0.4 mg/dL (ref 0.2–1.2)
Total Protein: 7.9 g/dL (ref 6.0–8.3)

## 2015-05-11 LAB — LIPID PANEL
CHOL/HDL RATIO: 5.6 ratio
CHOLESTEROL: 145 mg/dL (ref 0–200)
HDL: 26 mg/dL — ABNORMAL LOW (ref >=40)
LDL Cholesterol: 84 mg/dL (ref 0–99)
Triglycerides: 175 mg/dL — ABNORMAL HIGH (ref <150)
VLDL: 35 mg/dL (ref 0–40)

## 2015-05-12 LAB — RPR

## 2015-05-12 LAB — HIV-1 RNA QUANT-NO REFLEX-BLD
HIV 1 RNA QUANT: 14768 {copies}/mL — AB (ref <20)
HIV-1 RNA Quant, Log: 4.17 {Log} — ABNORMAL HIGH (ref <1.30)

## 2015-05-12 LAB — T-HELPER CELL (CD4) - (RCID CLINIC ONLY)
CD4 % Helper T Cell: 11 % — ABNORMAL LOW (ref 33–55)
CD4 T Cell Abs: 310 /uL — ABNORMAL LOW (ref 400–2700)

## 2015-05-25 ENCOUNTER — Ambulatory Visit (INDEPENDENT_AMBULATORY_CARE_PROVIDER_SITE_OTHER): Payer: Medicaid Other | Admitting: Infectious Diseases

## 2015-05-25 ENCOUNTER — Encounter: Payer: Self-pay | Admitting: Infectious Diseases

## 2015-05-25 VITALS — Ht 63.0 in | Wt 100.0 lb

## 2015-05-25 DIAGNOSIS — Z23 Encounter for immunization: Secondary | ICD-10-CM | POA: Diagnosis not present

## 2015-05-25 DIAGNOSIS — Z79899 Other long term (current) drug therapy: Secondary | ICD-10-CM | POA: Diagnosis not present

## 2015-05-25 DIAGNOSIS — R1013 Epigastric pain: Secondary | ICD-10-CM | POA: Diagnosis not present

## 2015-05-25 DIAGNOSIS — Z113 Encounter for screening for infections with a predominantly sexual mode of transmission: Secondary | ICD-10-CM | POA: Diagnosis not present

## 2015-05-25 DIAGNOSIS — H6500 Acute serous otitis media, unspecified ear: Secondary | ICD-10-CM | POA: Diagnosis not present

## 2015-05-25 DIAGNOSIS — R1033 Periumbilical pain: Secondary | ICD-10-CM | POA: Diagnosis not present

## 2015-05-25 DIAGNOSIS — B20 Human immunodeficiency virus [HIV] disease: Secondary | ICD-10-CM | POA: Diagnosis present

## 2015-05-25 DIAGNOSIS — H6592 Unspecified nonsuppurative otitis media, left ear: Secondary | ICD-10-CM | POA: Diagnosis not present

## 2015-05-25 NOTE — Progress Notes (Signed)
   Subjective:    Patient ID: Eric Jefferson, male    DOB: 06-22-1961, 54 y.o.   MRN: [id number]  HPI 53 yo [place] M with HIV/AIDS, previously treated TB, adm to [facility] 2-23 to 12-29-12 with worsening SOB and nodular lung disease. His BAL grew histo and he was started on itraconazole.  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 He has been changed to stribilid. He had persistently elevated HIV RNA and DRV was added to his stribilid.  Per his pharmacy he has not picked up his ART. He states he has been missing doses.  Has been sleeping poorly.  Has been constipated. Has been having stomach pain, improves after going to bathroom.   States he cannot hear in L ear for last 3 months.   HIV 1 RNA QUANT (copies/mL)  Date Value  05/11/2015 14768*  11/04/2013 89*  10/21/2013 40172*   CD4 T CELL ABS (/uL)  Date Value  05/11/2015 310*  11/04/2013 410  10/21/2013 290*    Review of Systems hearing loss for 3 months. Constipation. Dinks 1-2 bottles of water/day. See HPI.     Objective:   Physical Exam  Constitutional: He appears well-developed.  HENT:  Ears:  Mouth/Throat: No oropharyngeal exudate.  Eyes: EOM are normal. Pupils are equal, round, and reactive to light.  Neck: Neck supple.  Cardiovascular: Normal rate, regular rhythm and normal heart sounds.   Pulmonary/Chest: Effort normal and breath sounds normal.  Abdominal: Soft. Bowel sounds are normal. He exhibits no distension. There is no tenderness.  Lymphadenopathy:    He has no cervical adenopathy.          Assessment & Plan:

## 2015-05-25 NOTE — Assessment & Plan Note (Signed)
Asked him to take his meds everyday without missing.  Will see him back in 6 weeks, labs 4 weeks prior.

## 2015-05-25 NOTE — Addendum Note (Signed)
Addended by: Landis Gandy on: 05/25/2015 02:26 PM   Modules accepted: Orders

## 2015-05-28 ENCOUNTER — Telehealth: Payer: Self-pay | Admitting: *Deleted

## 2015-05-28 NOTE — Telephone Encounter (Signed)
thanks

## 2015-05-28 NOTE — Telephone Encounter (Signed)
[facility] E[facility] physicians not currently taking Medicaid patients.  Patient has [employer], has [facility] listed as PCP.  Due to insurance restrictions, referrals will need to be made by this provider.  RN contacted [facility], made 1st available new patient appointment for Friday 9/16 at 10:00.  [facility] aware that they will need to use th phone translation service for this patient, will need a translator as patient is [place].

## 2015-06-22 ENCOUNTER — Other Ambulatory Visit: Payer: Medicaid Other

## 2015-06-22 DIAGNOSIS — B20 Human immunodeficiency virus [HIV] disease: Secondary | ICD-10-CM

## 2015-06-23 LAB — HIV-1 RNA ULTRAQUANT REFLEX TO GENTYP+
HIV 1 RNA Quant: 63945 copies/mL — ABNORMAL HIGH (ref <20)
HIV-1 RNA Quant, Log: 4.81 {Log} — ABNORMAL HIGH (ref <1.30)

## 2015-06-23 LAB — T-HELPER CELL (CD4) - (RCID CLINIC ONLY)
CD4 % Helper T Cell: 10 % — ABNORMAL LOW (ref 33–55)
CD4 T Cell Abs: 150 /uL — ABNORMAL LOW (ref 400–2700)

## 2015-06-25 ENCOUNTER — Other Ambulatory Visit: Payer: Medicaid Other

## 2015-07-01 LAB — HIV-1 GENOTYPR PLUS

## 2015-07-14 ENCOUNTER — Telehealth: Payer: Self-pay | Admitting: Pharmacist Clinician (PhC)/ Clinical Pharmacy Specialist

## 2015-07-14 NOTE — Telephone Encounter (Signed)
Called him to remind him to come to the visit tomorrow. Sounded like he forgot or didn't know about the appt for tomorrow. He appreciated the call.

## 2015-07-15 ENCOUNTER — Encounter: Payer: Self-pay | Admitting: Infectious Diseases

## 2015-07-15 ENCOUNTER — Telehealth: Payer: Self-pay | Admitting: Infectious Diseases

## 2015-07-15 ENCOUNTER — Ambulatory Visit (INDEPENDENT_AMBULATORY_CARE_PROVIDER_SITE_OTHER): Payer: Medicaid Other | Admitting: Infectious Diseases

## 2015-07-15 VITALS — BP 95/62 | HR 69 | Temp 98.1°F | Wt 94.0 lb

## 2015-07-15 DIAGNOSIS — Z23 Encounter for immunization: Secondary | ICD-10-CM

## 2015-07-15 DIAGNOSIS — R1033 Periumbilical pain: Secondary | ICD-10-CM | POA: Diagnosis not present

## 2015-07-15 DIAGNOSIS — B20 Human immunodeficiency virus [HIV] disease: Secondary | ICD-10-CM | POA: Diagnosis not present

## 2015-07-15 DIAGNOSIS — H6592 Unspecified nonsuppurative otitis media, left ear: Secondary | ICD-10-CM | POA: Diagnosis not present

## 2015-07-15 DIAGNOSIS — Z113 Encounter for screening for infections with a predominantly sexual mode of transmission: Secondary | ICD-10-CM | POA: Diagnosis not present

## 2015-07-15 DIAGNOSIS — R630 Anorexia: Secondary | ICD-10-CM | POA: Diagnosis not present

## 2015-07-15 DIAGNOSIS — H6500 Acute serous otitis media, unspecified ear: Secondary | ICD-10-CM | POA: Diagnosis not present

## 2015-07-15 DIAGNOSIS — R1013 Epigastric pain: Secondary | ICD-10-CM | POA: Diagnosis not present

## 2015-07-15 DIAGNOSIS — Z79899 Other long term (current) drug therapy: Secondary | ICD-10-CM | POA: Diagnosis not present

## 2015-07-15 MED ORDER — ONDANSETRON 4 MG PO TBDP: 4.0000 mg | ORAL_TABLET | Freq: Three times a day (TID) | ORAL | Status: DC | PRN

## 2015-07-15 MED ORDER — MEGESTROL ACETATE 400 MG/10ML PO SUSP: 400.0000 mg | Freq: Every day | ORAL | Status: DC

## 2015-07-15 NOTE — Progress Notes (Signed)
   Subjective:    Patient ID: Eric Jefferson, male    DOB: 1961/04/09, 54 y.o.   MRN: [id number]  HPI 54 yo [place] M with HIV/AIDS, previously treated TB, adm to [facility] 2-23 to 12-29-12 with worsening SOB and nodular lung disease. His BAL grew histo and he was started on itraconazole.  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 He has been changed to stribilid. He had persistently elevated HIV RNA and DRV was added to his stribilid.  He had genoytpe 06-22-15 showing: Reverse Transcriptase Gene: Y181C, L210W, T215D  Protease Gene: A71V   States he had n/v with taking ART over weekend. Other days he has had no problems.  occas decrease in appetite.   Review of Systems  Constitutional: Positive for appetite change. Negative for unexpected weight change.  Gastrointestinal: Positive for nausea and vomiting. Negative for diarrhea and constipation.  Genitourinary: Negative for difficulty urinating.   Please see HPI. 12 point ROS o/w (-)     Objective:   Physical Exam  Constitutional: He appears well-developed and well-nourished.  HENT:  Mouth/Throat: No oropharyngeal exudate.  Eyes: EOM are normal. Pupils are equal, round, and reactive to light.  Neck: Neck supple.  Cardiovascular: Normal rate and normal heart sounds.   Pulmonary/Chest: Effort normal and breath sounds normal.  Abdominal: Soft. There is no tenderness. There is no rebound.  Lymphadenopathy:    He has no cervical adenopathy.       Assessment & Plan:

## 2015-07-15 NOTE — Telephone Encounter (Signed)
error 

## 2015-07-15 NOTE — Assessment & Plan Note (Signed)
Will start him on megace.  Will start him on zofran.

## 2015-07-15 NOTE — Assessment & Plan Note (Signed)
Seen with interpreter.  Will give him flu shot.  He states he has been adherent.  Will give him anti-emetic Will give him megace rtc in 6 weeks.

## 2015-07-16 LAB — HIV-1 RNA ULTRAQUANT REFLEX TO GENTYP+
HIV 1 RNA QUANT: 48971 {copies}/mL — AB (ref <20)
HIV-1 RNA QUANT, LOG: 4.69 {Log} — AB (ref <1.30)

## 2015-07-24 LAB — HIV-1 INTEGRASE GENOTYPE

## 2015-07-24 LAB — HIV-1 GENOTYPR PLUS

## 2015-08-03 ENCOUNTER — Telehealth: Payer: Self-pay | Admitting: Pharmacist Clinician (PhC)/ Clinical Pharmacy Specialist

## 2015-08-03 NOTE — Telephone Encounter (Signed)
Called Eric Jefferson to come back on 10/24 at 1430. His VL came back elevated again. He is probably not taking his meds. He has developed resistance already to some NRTIs and NNRTIs.

## 2015-08-24 ENCOUNTER — Ambulatory Visit (INDEPENDENT_AMBULATORY_CARE_PROVIDER_SITE_OTHER): Payer: Medicaid Other | Admitting: Infectious Diseases

## 2015-08-24 ENCOUNTER — Encounter: Payer: Self-pay | Admitting: Infectious Diseases

## 2015-08-24 VITALS — BP 112/76 | HR 94 | Temp 98.4°F | Wt 102.0 lb

## 2015-08-24 DIAGNOSIS — R630 Anorexia: Secondary | ICD-10-CM

## 2015-08-24 DIAGNOSIS — B20 Human immunodeficiency virus [HIV] disease: Secondary | ICD-10-CM | POA: Diagnosis present

## 2015-08-24 DIAGNOSIS — H6500 Acute serous otitis media, unspecified ear: Secondary | ICD-10-CM | POA: Diagnosis not present

## 2015-08-24 DIAGNOSIS — Z113 Encounter for screening for infections with a predominantly sexual mode of transmission: Secondary | ICD-10-CM | POA: Diagnosis not present

## 2015-08-24 DIAGNOSIS — Z23 Encounter for immunization: Secondary | ICD-10-CM | POA: Diagnosis not present

## 2015-08-24 DIAGNOSIS — R1033 Periumbilical pain: Secondary | ICD-10-CM | POA: Diagnosis not present

## 2015-08-24 DIAGNOSIS — H6592 Unspecified nonsuppurative otitis media, left ear: Secondary | ICD-10-CM | POA: Diagnosis not present

## 2015-08-24 DIAGNOSIS — Z79899 Other long term (current) drug therapy: Secondary | ICD-10-CM | POA: Diagnosis not present

## 2015-08-24 DIAGNOSIS — R1013 Epigastric pain: Secondary | ICD-10-CM | POA: Diagnosis not present

## 2015-08-24 MED ORDER — MIRTAZAPINE 15 MG PO TABS: 15.0000 mg | ORAL_TABLET | Freq: Every day | ORAL | Status: DC

## 2015-08-24 MED ORDER — ELVITEG-COBIC-EMTRICIT-TENOFAF 150-150-200-10 MG PO TABS: 1.0000 | ORAL_TABLET | Freq: Every day | ORAL | Status: DC

## 2015-08-24 NOTE — Assessment & Plan Note (Signed)
Will change his rx to remeron.  Stop megace.  D/i pharmacy

## 2015-08-24 NOTE — Assessment & Plan Note (Signed)
Will change him to genvoya, drv.  He has gotten flu shot.  Will see him back in 2 months.

## 2015-08-24 NOTE — Progress Notes (Signed)
   Subjective:    Patient ID: Eric Jefferson, male    DOB: 09-30-1961, 54 y.o.   MRN: [id number]  HPI  54 yo [place] M with HIV/AIDS, previously treated TB, adm to [facility] 2-23 to 12-29-12 with worsening SOB and nodular lung disease. His BAL grew histo and he was started on itraconazole.  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 He has been changed to stribilid. He had persistently elevated HIV RNA and DRV was added to his stribilid.  He had genoytpe 06-22-15 showing: Reverse Transcriptase Gene: Y181C, L210W, T215D  Protease Gene: A71V   Has been taking his ART consistently.  Has been on megace as well- has made him dizzy and given him headaches.   HIV 1 RNA QUANT (copies/mL)  Date Value  07/15/2015 48971*  06/22/2015 63945*  05/11/2015 14768*   CD4 T CELL ABS (/uL)  Date Value  06/22/2015 150*  05/11/2015 310*  11/04/2013 410     Review of Systems  Constitutional: Positive for appetite change. Negative for fever, chills and unexpected weight change.  Eyes: Positive for photophobia.  Respiratory: Negative for cough and shortness of breath.   Gastrointestinal: Positive for constipation. Negative for diarrhea.  Genitourinary: Negative for difficulty urinating.    appetite has been intermittent. Wt up 8# since last visit.  Has enough food.      Objective:   Physical Exam  Constitutional: He appears well-developed and well-nourished.  HENT:  Mouth/Throat: No oropharyngeal exudate.  Eyes: EOM are normal. Pupils are equal, round, and reactive to light.  Neck: Neck supple.  Cardiovascular: Normal rate, regular rhythm and normal heart sounds.   Pulmonary/Chest: Effort normal and breath sounds normal.  Abdominal: Soft. Bowel sounds are normal. There is no tenderness. There is no rebound.  Musculoskeletal: He exhibits no edema.  Lymphadenopathy:    He has no cervical adenopathy.      Assessment & Plan:

## 2015-08-24 NOTE — Addendum Note (Signed)
Addended by: HATCHER, JEFFREY C on: 08/24/2015 03:42 PM   Modules accepted: Orders, Medications

## 2015-08-26 ENCOUNTER — Ambulatory Visit: Payer: Medicaid Other | Admitting: Infectious Diseases

## 2015-08-26 LAB — HIV-1 RNA QUANT-NO REFLEX-BLD
HIV 1 RNA Quant: 85489 copies/mL — ABNORMAL HIGH (ref <20)
HIV-1 RNA Quant, Log: 4.93 Log copies/mL — ABNORMAL HIGH (ref <1.30)

## 2015-08-27 LAB — T-HELPER CELL (CD4) - (RCID CLINIC ONLY)
CD4 % Helper T Cell: 11 % — ABNORMAL LOW (ref 33–55)
CD4 T CELL ABS: 180 /uL — AB (ref 400–2700)

## 2015-09-02 ENCOUNTER — Telehealth: Payer: Self-pay | Admitting: Pharmacist Clinician (PhC)/ Clinical Pharmacy Specialist

## 2015-09-02 NOTE — Telephone Encounter (Signed)
Call Eric Jefferson to come in to go over his meds again. I think he still have some confusion. I told him to bring in all of his meds.

## 2015-09-03 ENCOUNTER — Encounter: Payer: Self-pay | Admitting: Pharmacist Clinician (PhC)/ Clinical Pharmacy Specialist

## 2015-09-03 ENCOUNTER — Other Ambulatory Visit: Payer: Self-pay | Admitting: Pharmacist Clinician (PhC)/ Clinical Pharmacy Specialist

## 2015-09-03 ENCOUNTER — Ambulatory Visit (INDEPENDENT_AMBULATORY_CARE_PROVIDER_SITE_OTHER): Payer: Medicaid Other | Admitting: Pharmacist Clinician (PhC)/ Clinical Pharmacy Specialist

## 2015-09-03 DIAGNOSIS — B20 Human immunodeficiency virus [HIV] disease: Secondary | ICD-10-CM | POA: Diagnosis present

## 2015-09-03 MED ORDER — EMTRICITABINE-TENOFOVIR AF 200-25 MG PO TABS: 1.0000 | ORAL_TABLET | Freq: Every day | ORAL | Status: DC

## 2015-09-03 MED ORDER — DOLUTEGRAVIR SODIUM 50 MG PO TABS: 50.0000 mg | ORAL_TABLET | Freq: Every day | ORAL | Status: DC

## 2015-09-03 MED ORDER — SULFAMETHOXAZOLE-TRIMETHOPRIM 800-160 MG PO TABS: 1.0000 | ORAL_TABLET | Freq: Every day | ORAL | Status: DC

## 2015-09-03 MED ORDER — DARUNAVIR-COBICISTAT 800-150 MG PO TABS: 1.0000 | ORAL_TABLET | Freq: Every day | ORAL | Status: DC

## 2015-09-03 NOTE — Progress Notes (Signed)
Patient ID: Eric Jefferson, male   DOB: Jan 05, 1961, 53 y.o.   MRN: [id number]   HPI: Eric Jefferson is a 54 y.o. male with a uncontrolled viral load and poor understanding of his medication regimen. He does not speak English and was having difficulty taking his medications due to nausea from megace. He was on megace for appetite stimulation but we changed him to mirtazapine because he was unable to tolerate megace.  Allergies: No Known Allergies  Vitals:    Past Medical History: Past Medical History  Diagnosis Date  . HIV (human immunodeficiency virus infection) ([place])   . TB (pulmonary tuberculosis)   . Diabetes [facility])     Social History: Social History   Social History  . Marital Status: Single    Spouse Name: N/A  . Number of Children: 6  . Years of Education: N/A   Occupational History  .      [employer]   Social History Main Topics  . Smoking status: Current Every Day Smoker -- 1.00 packs/day for 30 years    Types: Cigarettes  . Smokeless tobacco: Never Used  . Alcohol Use: No     Comment: former  . Drug Use: No  . Sexual Activity: Yes     Comment: given condoms   Other Topics Concern  . Not on file   Social History Narrative    Previous Regimen: Stribild - developed some resistance Prezista + Stribild  Current Regimen: Prezista + Stribild  Labs: HIV 1 RNA QUANT (copies/mL)  Date Value  08/24/2015 85489*  07/15/2015 48971*  06/22/2015 77824*   CD4 T CELL ABS (/uL)  Date Value  08/24/2015 180*  06/22/2015 150*  05/11/2015 310*   HEP B S AB (no units)  Date Value  05/09/2008 Negative   HEPATITIS B SURFACE AG (no units)  Date Value  01/07/2009 NEG   HCV AB (no units)  Date Value  05/09/2008 Negative    CrCl: CrCl cannot be calculated (Patient has no serum creatinine result on file.).  Lipids:    Component Value Date/Time   CHOL 145 05/11/2015 1138   TRIG 175* 05/11/2015 1138   HDL 26* 05/11/2015 1138   CHOLHDL 5.6 05/11/2015 1138   VLDL 35  05/11/2015 1138   LDLCALC 84 05/11/2015 1138   HIV Genotype Composite Data Genotype Dates:   Mutations in Bold impact drug susceptibility RT Mutations L210W, T215D, Y181C  PI Mutations M46L  Integrase Mutations    Interpretation of Genotype Data per [employer] HIV Database Nucleoside RTIs  abacavir (ABC) Low-Level Resistance zidovudine (AZT) Intermediate Resistance stavudine (D4T) Intermediate Resistance didanosine (DDI) Low-Level Resistance emtricitabine (FTC) Susceptible lamivudine (3TC) Susceptible tenofovir (TDF) Low-Level Resistance   Non-Nucleoside RTIs  efavirenz (EFV) Intermediate Resistance etravirine (ETR) Intermediate Resistance nevirapine (NVP) High-Level Resistance rilpivirine (RPV) Intermediate Resistance   Protease Inhibitors  atazanavir/r (ATV/r) Potential Low-Level Resistance darunavir/r (DRV/r) Susceptible fosamprenavir/r (FPV/r) Potential Low-Level Resistance indinavir/r (IDV/r) Potential Low-Level Resistance lopinavir/r (LPV/r) Potential Low-Level Resistance saquinavir/r (SQV/r) Potential Low-Level Resistance tipranavir/r (TPV/r) Potential Low-Level Resistance   Integrase Inhibitors      Assessment: Eric Jefferson is a 54 yo M with unsuppressed HIV. At first he was having difficulty understanding his medication regimen. In the past he had some confusion with the need to take his regimen every single day for the rest of his life. He states recently he has had some issues with nausea due to megace and would skip his antiretroviral regimen on the days he was nauseous. He estimates he  has missed at least several doses in the past month.  Patient was advised to stop his medications, Genvoya and Prezista. We will start him on Tivicay, Descovy, and Prezcobix. He has medicaid so coverage should not be an issue.  Patient is not currently on any OI prophylaxis. His CD4 is 180. Will add Septra for PCP prophylaxis. RX was sent to pharmacy.  He also complained of tooth  pain, limiting his ability to eat. [name] will contact [name] to schedule him for dental clinic.  Recommendations: - Stop [name] and Prezista - Start Tivicay, Descovy, and Prezcobix - Start Septra for PCP prophylaxis - Follow-up with dental clinic for tooth pain  Ricka Burdock, PharmD PGY-1 Pharmacy Resident 09/03/2015, 3:48 PM

## 2015-09-10 ENCOUNTER — Telehealth: Payer: Self-pay | Admitting: Pharmacist Clinician (PhC)/ Clinical Pharmacy Specialist

## 2015-09-10 NOTE — Telephone Encounter (Signed)
Called Eric Jefferson about his dental appt today. [name] is working to get him an appt for Dec. I also asked him if he has picked up his supply of the new regimen for his HIV (and septra). He has and is taking them appropriately. Going to call him back when his appt for Dental is available.

## 2015-10-22 ENCOUNTER — Encounter: Payer: Self-pay | Admitting: Infectious Diseases

## 2015-10-22 ENCOUNTER — Ambulatory Visit (INDEPENDENT_AMBULATORY_CARE_PROVIDER_SITE_OTHER): Payer: Medicaid Other | Admitting: Infectious Diseases

## 2015-10-22 ENCOUNTER — Encounter: Payer: Self-pay | Admitting: Primary Care

## 2015-10-22 VITALS — BP 115/78 | HR 92 | Temp 98.3°F | Wt 111.0 lb

## 2015-10-22 DIAGNOSIS — K029 Dental caries, unspecified: Secondary | ICD-10-CM | POA: Diagnosis not present

## 2015-10-22 DIAGNOSIS — H6500 Acute serous otitis media, unspecified ear: Secondary | ICD-10-CM | POA: Diagnosis not present

## 2015-10-22 DIAGNOSIS — Z79899 Other long term (current) drug therapy: Secondary | ICD-10-CM | POA: Diagnosis not present

## 2015-10-22 DIAGNOSIS — H6592 Unspecified nonsuppurative otitis media, left ear: Secondary | ICD-10-CM | POA: Diagnosis not present

## 2015-10-22 DIAGNOSIS — B20 Human immunodeficiency virus [HIV] disease: Secondary | ICD-10-CM

## 2015-10-22 DIAGNOSIS — R630 Anorexia: Secondary | ICD-10-CM

## 2015-10-22 DIAGNOSIS — R1033 Periumbilical pain: Secondary | ICD-10-CM | POA: Diagnosis not present

## 2015-10-22 DIAGNOSIS — Z113 Encounter for screening for infections with a predominantly sexual mode of transmission: Secondary | ICD-10-CM | POA: Diagnosis not present

## 2015-10-22 DIAGNOSIS — R1013 Epigastric pain: Secondary | ICD-10-CM | POA: Diagnosis not present

## 2015-10-22 DIAGNOSIS — Z23 Encounter for immunization: Secondary | ICD-10-CM | POA: Diagnosis not present

## 2015-10-22 MED ORDER — MIRTAZAPINE 15 MG PO TABS: 15.0000 mg | ORAL_TABLET | Freq: Every day | ORAL | Status: DC

## 2015-10-22 NOTE — Assessment & Plan Note (Signed)
He appears to be doing better Will refill remeron Will continue his current ART Will ask [name] for help with letting him know his results.  Will see him back in 2 months.  Flu given 07-2015

## 2015-10-22 NOTE — Progress Notes (Signed)
   Subjective:    Patient ID: Eric Jefferson, male    DOB: 1961/07/12, 54 y.o.   MRN: [id number]  HPI 54 yo [place] M with HIV/AIDS, previously treated TB, adm to [facility] 2-23 to 12-29-12 with worsening SOB and nodular lung disease. His BAL grew histo and he was started on itraconazole.  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 He has been changed to stribilid. He had persistently elevated HIV RNA and DRV was added to his stribilid.  He had genoytpe 06-22-15 showing: Reverse Transcriptase Gene: Y181C, L210W, T215D  Protease Gene: A71V   He had pharm visit DTGV/DRVc/Descovey.  Has had no problems with his new ART. He breaks them up throughout the day.  He is not taking the remeron, he would like a refill.   HIV 1 RNA QUANT (copies/mL)  Date Value  08/24/2015 85489*  07/15/2015 48971*  06/22/2015 [id number]*   CD4 T CELL ABS (/uL)  Date Value  08/24/2015 180*  06/22/2015 150*  05/11/2015 310*    Review of Systems  Constitutional: Negative for fever, chills, appetite change and unexpected weight change.  Gastrointestinal: Negative for diarrhea and constipation.  Genitourinary: Negative for difficulty urinating.  has had some difficulty with abd discomfort. He attributes to eating hot pepper.  Hx of colonoscopy. 2010. has gained 17# since September.      Objective:   Physical Exam  Constitutional: He appears well-developed and well-nourished.  HENT:  Mouth/Throat: No oropharyngeal exudate.  Eyes: EOM are normal. Pupils are equal, round, and reactive to light.  Neck: Neck supple.  Cardiovascular: Normal rate, regular rhythm and normal heart sounds.   Pulmonary/Chest: Effort normal and breath sounds normal.  Abdominal: Soft. Bowel sounds are normal. There is no tenderness. There is no rebound.  Musculoskeletal: He exhibits no edema.  Lymphadenopathy:    He has no cervical adenopathy.       Assessment & Plan:

## 2015-10-22 NOTE — Assessment & Plan Note (Signed)
Will refill his remeron.  Encouraged by wt gain.

## 2015-10-22 NOTE — Assessment & Plan Note (Signed)
His teeth are much better today.  My great appreciation to dental.

## 2015-10-23 LAB — T-HELPER CELL (CD4) - (RCID CLINIC ONLY)
CD4 T CELL ABS: 480 /uL (ref 400–2700)
CD4 T CELL HELPER: 11 % — AB (ref 33–55)

## 2015-10-24 LAB — HIV-1 RNA QUANT-NO REFLEX-BLD
HIV 1 RNA Quant: 29 copies/mL — ABNORMAL HIGH (ref <20)
HIV-1 RNA Quant, Log: 1.46 Log copies/mL — ABNORMAL HIGH (ref <1.30)

## 2015-12-23 ENCOUNTER — Ambulatory Visit (INDEPENDENT_AMBULATORY_CARE_PROVIDER_SITE_OTHER): Payer: Medicaid Other | Admitting: Infectious Diseases

## 2015-12-23 ENCOUNTER — Encounter: Payer: Self-pay | Admitting: Infectious Diseases

## 2015-12-23 VITALS — BP 115/68 | HR 79 | Temp 98.0°F | Wt 111.0 lb

## 2015-12-23 DIAGNOSIS — H6592 Unspecified nonsuppurative otitis media, left ear: Secondary | ICD-10-CM | POA: Diagnosis not present

## 2015-12-23 DIAGNOSIS — F17200 Nicotine dependence, unspecified, uncomplicated: Secondary | ICD-10-CM | POA: Diagnosis not present

## 2015-12-23 DIAGNOSIS — R1013 Epigastric pain: Secondary | ICD-10-CM | POA: Diagnosis not present

## 2015-12-23 DIAGNOSIS — Z113 Encounter for screening for infections with a predominantly sexual mode of transmission: Secondary | ICD-10-CM | POA: Diagnosis not present

## 2015-12-23 DIAGNOSIS — B20 Human immunodeficiency virus [HIV] disease: Secondary | ICD-10-CM

## 2015-12-23 DIAGNOSIS — H6500 Acute serous otitis media, unspecified ear: Secondary | ICD-10-CM | POA: Diagnosis not present

## 2015-12-23 DIAGNOSIS — Z79899 Other long term (current) drug therapy: Secondary | ICD-10-CM | POA: Diagnosis not present

## 2015-12-23 DIAGNOSIS — R1033 Periumbilical pain: Secondary | ICD-10-CM | POA: Diagnosis not present

## 2015-12-23 DIAGNOSIS — Z23 Encounter for immunization: Secondary | ICD-10-CM

## 2015-12-23 DIAGNOSIS — K029 Dental caries, unspecified: Secondary | ICD-10-CM

## 2015-12-23 LAB — CBC
HCT: 42 % (ref 39.0–52.0)
Hemoglobin: 13.7 g/dL (ref 13.0–17.0)
MCH: 28.3 pg (ref 26.0–34.0)
MCHC: 32.6 g/dL (ref 30.0–36.0)
MCV: 86.8 fL (ref 78.0–100.0)
MPV: 9.6 fL (ref 8.6–12.4)
PLATELETS: 256 10*3/uL (ref 150–400)
RBC: 4.84 MIL/uL (ref 4.22–5.81)
RDW: 15.2 % (ref 11.5–15.5)
WBC: 8.3 10*3/uL (ref 4.0–10.5)

## 2015-12-23 LAB — COMPREHENSIVE METABOLIC PANEL
ALK PHOS: 72 U/L (ref 40–115)
ALT: 13 U/L (ref 9–46)
AST: 25 U/L (ref 10–35)
Albumin: 4.2 g/dL (ref 3.6–5.1)
BUN: 19 mg/dL (ref 7–25)
CO2: 26 mmol/L (ref 20–31)
Calcium: 9 mg/dL (ref 8.6–10.3)
Chloride: 100 mmol/L (ref 98–110)
Creat: 1.37 mg/dL — ABNORMAL HIGH (ref 0.70–1.33)
GLUCOSE: 57 mg/dL — AB (ref 65–99)
POTASSIUM: 4.1 mmol/L (ref 3.5–5.3)
SODIUM: 134 mmol/L — AB (ref 135–146)
Total Bilirubin: 0.3 mg/dL (ref 0.2–1.2)
Total Protein: 8.3 g/dL — ABNORMAL HIGH (ref 6.1–8.1)

## 2015-12-23 LAB — LIPID PANEL
Cholesterol: 218 mg/dL — ABNORMAL HIGH (ref 125–200)
HDL: 43 mg/dL (ref >=40)
LDL CALC: 125 mg/dL (ref <130)
Total CHOL/HDL Ratio: 5.1 Ratio — ABNORMAL HIGH (ref <=5.0)
Triglycerides: 250 mg/dL — ABNORMAL HIGH (ref <150)
VLDL: 50 mg/dL — ABNORMAL HIGH (ref <30)

## 2015-12-23 NOTE — Progress Notes (Signed)
   Subjective:    Patient ID: Eric Jefferson, male    DOB: Apr 25, 1961, 55 y.o.   MRN: [id number]  HPI 55 yo [place] M with HIV/AIDS, previously treated TB, adm to [facility] 2-23 to 12-29-12 with worsening SOB and nodular lung disease. His BAL grew histo and he was started on itraconazole.  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 He has been changed to stribilid. He had persistently elevated HIV RNA and DRV was added to his stribilid.  He had genoytpe 06-22-15 showing: Reverse Transcriptase Gene: Y181C, L210W, T215D  Protease Gene: A71V   He had pharm visit DTGV/DRVc/Descovey (09-2015) .   HIV 1 RNA QUANT (copies/mL)  Date Value  10/22/2015 29*  08/24/2015 85489*  07/15/2015 48971*   CD4 T CELL ABS (/uL)  Date Value  10/22/2015 480  08/24/2015 180*  06/22/2015 150*    He states he has been feeling well.  He denies missed doses of his ART. Has pill box  Still smoking, "alot". >1/2 ppd.   Review of Systems  Constitutional: Negative for appetite change and unexpected weight change.  Respiratory: Negative for cough and shortness of breath.   Gastrointestinal: Negative for diarrhea and constipation.  Genitourinary: Negative for difficulty urinating.  Neurological: Negative for numbness.       Objective:   Physical Exam  Constitutional: He appears well-developed and well-nourished.  HENT:  Mouth/Throat: No oropharyngeal exudate.  Eyes: EOM are normal. Pupils are equal, round, and reactive to light.  Neck: Neck supple.  Cardiovascular: Normal rate, regular rhythm and normal heart sounds.   Pulmonary/Chest: Effort normal and breath sounds normal.  Abdominal: Soft. Bowel sounds are normal. There is no tenderness. There is no rebound.  Musculoskeletal: He exhibits no edema.  Lymphadenopathy:    He has no cervical adenopathy.      Assessment & Plan:

## 2015-12-23 NOTE — Assessment & Plan Note (Signed)
Encouraged to quit. 

## 2015-12-23 NOTE — Assessment & Plan Note (Signed)
Will stop  Next, last Hep B today My great appreciation to [facility] for helping with this pt.  Repeat labs today.  rtc in 6 months provided he remains undetectable.

## 2015-12-23 NOTE — Assessment & Plan Note (Signed)
Much better since dental f/u

## 2015-12-24 ENCOUNTER — Telehealth: Payer: Self-pay | Admitting: *Deleted

## 2015-12-24 LAB — HIV-1 RNA ULTRAQUANT REFLEX TO GENTYP+
HIV 1 RNA Quant: <20 copies/mL (ref <20)
HIV-1 RNA Quant, Log: <1.3 Log copies/mL (ref <1.30)

## 2015-12-24 LAB — RPR

## 2015-12-24 LAB — T-HELPER CELL (CD4) - (RCID CLINIC ONLY)
CD4 T CELL ABS: 540 /uL (ref 400–2700)
CD4 T CELL HELPER: 14 % — AB (ref 33–55)

## 2015-12-24 NOTE — Telephone Encounter (Signed)
-----   Message from [name], MD sent at 12/24/2015  2:15 PM EST ----- Repeat Cr in 1 week please  ----- Message -----    From: Lab in [facility] Interface    Sent: 12/23/2015   9:02 PM      To: [name], MD

## 2015-12-30 LAB — HIV-1 INTEGRASE GENOTYPE

## 2016-01-19 ENCOUNTER — Telehealth: Payer: Self-pay | Admitting: Pharmacist Clinician (PhC)/ Clinical Pharmacy Specialist

## 2016-01-19 NOTE — Telephone Encounter (Signed)
Call Bartolo to tell him to come to the next dental clinic on 3/29 at 1400.

## 2016-05-25 ENCOUNTER — Telehealth: Payer: Self-pay | Admitting: Pharmacist

## 2016-05-25 NOTE — Telephone Encounter (Signed)
Eric Jefferson called and stated he went to [facility] to pick up his medications and they told him he could not. I called [facility] and it looks like his Medicaid is changed to family planning now.  Called him back and informed him that he needs to come in and get [employer] and [employer].  He will come tomorrow 7/27 at 3pm to meet with Erin.

## 2016-05-26 ENCOUNTER — Other Ambulatory Visit: Payer: Self-pay | Admitting: *Deleted

## 2016-05-26 ENCOUNTER — Encounter: Payer: Self-pay | Admitting: Pharmacist Clinician (PhC)/ Clinical Pharmacy Specialist

## 2016-05-26 DIAGNOSIS — B20 Human immunodeficiency virus [HIV] disease: Secondary | ICD-10-CM

## 2016-05-26 MED ORDER — DARUNAVIR-COBICISTAT 800-150 MG PO TABS: 1.0000 | ORAL_TABLET | Freq: Every day | ORAL | 11 refills | Status: DC

## 2016-05-26 MED ORDER — EMTRICITABINE-TENOFOVIR AF 200-25 MG PO TABS: 1.0000 | ORAL_TABLET | Freq: Every day | ORAL | 11 refills | Status: DC

## 2016-05-26 MED ORDER — DOLUTEGRAVIR SODIUM 50 MG PO TABS: 50.0000 mg | ORAL_TABLET | Freq: Every day | ORAL | 11 refills | Status: DC

## 2016-05-26 NOTE — Progress Notes (Signed)
Apparently [facility] but was told that medicaid won't cover anymore. His medicaid has been changed to family planning. He has about 4 tablets of each ART (DTG/Prez/Desc). We scrambled today to get him ART for the next month. We'll file for [employer] and Juliann Pulse has started his paperwork for [employer]. He is going to bring his pay stubs back tomorrow.

## 2016-06-03 ENCOUNTER — Encounter: Payer: Self-pay | Admitting: Infectious Diseases

## 2016-06-06 NOTE — Progress Notes (Signed)
Thank you so much

## 2016-06-17 ENCOUNTER — Emergency Department (HOSPITAL_COMMUNITY): Payer: Medicaid Other

## 2016-06-17 ENCOUNTER — Emergency Department (HOSPITAL_COMMUNITY)
Admission: EM | Admit: 2016-06-17 | Discharge: 2016-06-17 | Disposition: A | Discharge status: Home / Self Care | Payer: Medicaid Other | Attending: Emergency Medicine | Admitting: Emergency Medicine

## 2016-06-17 ENCOUNTER — Encounter (HOSPITAL_COMMUNITY): Payer: Self-pay | Admitting: Emergency Medicine

## 2016-06-17 DIAGNOSIS — F17210 Nicotine dependence, cigarettes, uncomplicated: Secondary | ICD-10-CM | POA: Insufficient documentation

## 2016-06-17 DIAGNOSIS — Y990 Civilian activity done for income or pay: Secondary | ICD-10-CM | POA: Insufficient documentation

## 2016-06-17 DIAGNOSIS — E119 Type 2 diabetes mellitus without complications: Secondary | ICD-10-CM | POA: Insufficient documentation

## 2016-06-17 DIAGNOSIS — Z79899 Other long term (current) drug therapy: Secondary | ICD-10-CM | POA: Insufficient documentation

## 2016-06-17 DIAGNOSIS — S0990XA Unspecified injury of head, initial encounter: Secondary | ICD-10-CM | POA: Insufficient documentation

## 2016-06-17 DIAGNOSIS — Y939 Activity, unspecified: Secondary | ICD-10-CM | POA: Insufficient documentation

## 2016-06-17 DIAGNOSIS — W228XXA Striking against or struck by other objects, initial encounter: Secondary | ICD-10-CM | POA: Insufficient documentation

## 2016-06-17 DIAGNOSIS — Y9289 Other specified places as the place of occurrence of the external cause: Secondary | ICD-10-CM | POA: Insufficient documentation

## 2016-06-17 MED ORDER — IBUPROFEN 800 MG PO TABS: 800.0000 mg | ORAL_TABLET | Freq: Three times a day (TID) | ORAL | 0 refills | Status: DC | PRN

## 2016-06-17 NOTE — Discharge Instructions (Signed)
Follow-up with your doctor next week for recheck. You need to get a CT scan of your chest in one year to reevaluate that small nodule seen in the upper part of your lungs. Your family doctor can make those arrangements

## 2016-06-17 NOTE — ED Triage Notes (Signed)
HIT BACK OF HEAD AT WORK ,  WET AND WILD   No loc  , I can feel a lump  No bleeding , using  Translator

## 2016-06-17 NOTE — ED Notes (Signed)
Pt verbalized understanding of discharge instructions and follow up care

## 2016-06-17 NOTE — ED Notes (Signed)
Translator services used at time of assessmentConstellation Brands [phone]

## 2016-06-17 NOTE — ED Provider Notes (Signed)
[facility] DEPT Provider Note   CSN: [id number] Arrival date & time: 06/17/16  1326     History   Chief Complaint Chief Complaint  Patient presents with  . Head Injury    HPI Eric Jefferson is a 55 y.o. male.  Patient states that [place] no loss of consciousness patient complains of a headache   The history is provided by the patient. No language interpreter was used.  Head Injury   The incident occurred 12 to 24 hours ago. He came to the ER via walk-in. The injury mechanism was a direct blow. There was no loss of consciousness. The volume of blood lost was moderate. The quality of the pain is described as dull. The pain is at a severity of 6/10. The pain is moderate. The pain has been constant since the injury. Pertinent negatives include no numbness. He was found conscious by EMS personnel. He has tried nothing for the symptoms.    Past Medical History:  Diagnosis Date  . Diabetes ([place])   . HIV (human immunodeficiency virus infection) ([place])   . TB (pulmonary tuberculosis)     Patient Active Problem List   Diagnosis Date Noted  . Anorexia 07/15/2015  . Abdominal pain 02/09/2015  . Otitis media 02/09/2015  . Loss of weight 12/24/2012  . 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 Dental caries 09/09/2009  . Human immunodeficiency virus (HIV) disease ([place]) 05/06/2009  . PULMONARY TUBERCULOSIS 05/09/2008  . TOBACCO ABUSE 02/04/2008  . GERD 02/04/2008  . Nonspecific reaction to tuberculin skin test without active tuberculosis(795.51) 01/08/2008    History reviewed. No pertinent surgical history.     Home Medications    Prior to Admission medications   Medication Sig Start Date End Date Taking? Authorizing Provider  darunavir-cobicistat (PREZCOBIX) 800-150 MG tablet Take 1 tablet by mouth daily with supper. Swallow whole. Do NOT crush, break or chew tablets. Take with food. 05/26/16  Yes Truman Hayward, MD    dolutegravir (TIVICAY) 50 MG tablet Take 1 tablet (50 mg total) by mouth daily with supper. 05/26/16  Yes Truman Hayward, MD  emtricitabine-tenofovir AF (DESCOVY) 200-25 MG tablet Take 1 tablet by mouth daily with supper. 05/26/16  Yes Truman Hayward, MD  mirtazapine (REMERON) 15 MG tablet Take 1 tablet (15 mg total) by mouth at bedtime. 10/22/15  Yes [name], MD  pantoprazole (PROTONIX) 40 MG tablet Take 1 tablet (40 mg total) by mouth daily. 02/09/15  Yes [name], MD  sulfamethoxazole-trimethoprim (BACTRIM DS,SEPTRA DS) 800-160 MG tablet Take 1 tablet by mouth as needed for other. Prophylaxis 03/11/16  Yes Historical Provider, MD  ibuprofen (ADVIL,MOTRIN) 800 MG tablet Take 1 tablet (800 mg total) by mouth every 8 (eight) hours as needed for headache. 06/17/16   Milton Ferguson, MD  ondansetron (ZOFRAN ODT) 4 MG disintegrating tablet Take 1 tablet (4 mg total) by mouth every 8 (eight) hours as needed for nausea or vomiting. Patient not taking: Reported on 08/24/2015 07/15/15   [name], MD    Family History No family history on file.  Social History Social History  Substance Use Topics  . Smoking status: Current Every Day Smoker    Packs/day: 0.60    Years: 30.00    Types: Cigarettes  . Smokeless tobacco: Never Used  . Alcohol use No     Comment: former     Allergies   Review of patient's  allergies indicates no known allergies.   Review of Systems Review of Systems  Constitutional: Negative for appetite change and fatigue.  HENT: Negative for congestion, ear discharge and sinus pressure.   Eyes: Negative for discharge.  Respiratory: Negative for cough.   Cardiovascular: Negative for chest pain.  Gastrointestinal: Negative for abdominal pain and diarrhea.  Genitourinary: Negative for frequency and hematuria.  Musculoskeletal: Negative for back pain.  Skin: Negative for rash.  Neurological: Positive for headaches. Negative for seizures and  numbness.  Psychiatric/Behavioral: Negative for hallucinations.     Physical Exam Updated Vital Signs BP 117/84   Pulse 69   Temp 97.8 F (36.6 C) (Oral)   Resp 14   SpO2 100%   Physical Exam  Constitutional: He is oriented to person, place, and time. He appears well-developed.  HENT:  Head: Normocephalic.  Tender occipital head  Eyes: Conjunctivae and EOM are normal. No scleral icterus.  Neck: Neck supple. No thyromegaly present.  Cardiovascular: Normal rate and regular rhythm.  Exam reveals no gallop and no friction rub.   No murmur heard. Pulmonary/Chest: No stridor. He has no wheezes. He has no rales. He exhibits no tenderness.  Abdominal: He exhibits no distension. There is no tenderness. There is no rebound.  Musculoskeletal: Normal range of motion. He exhibits no edema.  Lymphadenopathy:    He has no cervical adenopathy.  Neurological: He is oriented to person, place, and time. He exhibits normal muscle tone. Coordination normal.  Skin: No rash noted. No erythema.  Psychiatric: He has a normal mood and affect. His behavior is normal.     ED Treatments / Results  Labs (all labs ordered are listed, but only abnormal results are displayed) Labs Reviewed - No data to display  EKG  EKG Interpretation None       Radiology Ct Head Wo Contrast  Result Date: 06/17/2016 CLINICAL DATA:  Fall.  Trauma to the back of head EXAM: CT HEAD WITHOUT CONTRAST CT CERVICAL SPINE WITHOUT CONTRAST TECHNIQUE: Multidetector CT imaging of the head and cervical spine was performed following the standard protocol without intravenous contrast. Multiplanar CT image reconstructions of the cervical spine were also generated. COMPARISON:  None. FINDINGS: CT HEAD FINDINGS Sinuses/Soft tissues: No significant soft tissue swelling. Mucosal thickening of ethmoid air cells. Hypoplastic frontal sinuses. No skull fracture. Intracranial: No mass lesion, hemorrhage, hydrocephalus, acute infarct,  intra-axial, or extra-axial fluid collection. CT CERVICAL SPINE FINDINGS Spinal visualization through the bottom of T1. Prevertebral soft tissues are within normal limits. No apical pneumothorax. Pleural-parenchymal scarring at both lung apices. An apparent right apical 5 mm nodule is most likely related to the pleural-parenchymal scarring. Right-sided uncovertebral joint hypertrophy at C4-5, causing right neural foraminal narrowing. Left-sided neural foraminal narrowing at C5-6 secondary to uncovertebral joint hypertrophy. Skull base intact. Maintenance of vertebral body height and alignment. Facets are well-aligned. IMPRESSION: 1.  No acute intracranial abnormality. 2. No acute fracture or subluxation in the cervical spine. Bilateral neural foraminal narrowing secondary to spondylosis. 3. Biapical pleural-parenchymal scarring. Apparent 5 mm right apical nodule is likely related to scarring. No follow-up needed if patient is low-risk. Non-contrast chest CT can be considered in 12 months if patient is high-risk. This recommendation follows the consensus statement: Guidelines for Management of Incidental Pulmonary Nodules Detected on CT Images:From the Fleischner Society 2017; published online before print (10.1148/radiol.IJ:2314499). Electronically Signed   By: Abigail Miyamoto M.D.   On: 06/17/2016 19:33   Ct Cervical Spine Wo Contrast  Result Date: 06/17/2016 CLINICAL  DATA:  Fall.  Trauma to the back of head EXAM: CT HEAD WITHOUT CONTRAST CT CERVICAL SPINE WITHOUT CONTRAST TECHNIQUE: Multidetector CT imaging of the head and cervical spine was performed following the standard protocol without intravenous contrast. Multiplanar CT image reconstructions of the cervical spine were also generated. COMPARISON:  None. FINDINGS: CT HEAD FINDINGS Sinuses/Soft tissues: No significant soft tissue swelling. Mucosal thickening of ethmoid air cells. Hypoplastic frontal sinuses. No skull fracture. Intracranial: No mass lesion,  hemorrhage, hydrocephalus, acute infarct, intra-axial, or extra-axial fluid collection. CT CERVICAL SPINE FINDINGS Spinal visualization through the bottom of T1. Prevertebral soft tissues are within normal limits. No apical pneumothorax. Pleural-parenchymal scarring at both lung apices. An apparent right apical 5 mm nodule is most likely related to the pleural-parenchymal scarring. Right-sided uncovertebral joint hypertrophy at C4-5, causing right neural foraminal narrowing. Left-sided neural foraminal narrowing at C5-6 secondary to uncovertebral joint hypertrophy. Skull base intact. Maintenance of vertebral body height and alignment. Facets are well-aligned. IMPRESSION: 1.  No acute intracranial abnormality. 2. No acute fracture or subluxation in the cervical spine. Bilateral neural foraminal narrowing secondary to spondylosis. 3. Biapical pleural-parenchymal scarring. Apparent 5 mm right apical nodule is likely related to scarring. No follow-up needed if patient is low-risk. Non-contrast chest CT can be considered in 12 months if patient is high-risk. This recommendation follows the consensus statement: Guidelines for Management of Incidental Pulmonary Nodules Detected on CT Images:From the Fleischner Society 2017; published online before print (10.1148/radiol.IJ:2314499). Electronically Signed   By: Abigail Miyamoto M.D.   On: 06/17/2016 19:33    Procedures Procedures (including critical care time)  Medications Ordered in ED Medications - No data to display   Initial Impression / Assessment and Plan / ED Course  I have reviewed the triage vital signs and the nursing notes.  Pertinent labs & imaging results that were available during my care of the patient were reviewed by me and considered in my medical decision making (see chart for details).  Clinical Course    CT the head and neck unremarkable except for questionable 5 mm nodule in the upper lung. Patient with a head injury mild concussion. He  will be given Motrin for pain as instructed to get a CT of his chest and 12 months. Patient will follow-up with his PCP  Final Clinical Impressions(s) / ED Diagnoses   Final diagnoses:  Head injury, initial encounter    New Prescriptions New Prescriptions   IBUPROFEN (ADVIL,MOTRIN) 800 MG TABLET    Take 1 tablet (800 mg total) by mouth every 8 (eight) hours as needed for headache.     Milton Ferguson, MD 06/17/16 2203

## 2016-06-17 NOTE — ED Notes (Signed)
Patient transported to CT 

## 2016-07-27 ENCOUNTER — Encounter: Payer: Self-pay | Admitting: Pharmacist

## 2016-07-27 ENCOUNTER — Other Ambulatory Visit: Payer: Self-pay | Admitting: Pharmacist

## 2016-07-27 DIAGNOSIS — B20 Human immunodeficiency virus [HIV] disease: Secondary | ICD-10-CM

## 2016-07-27 NOTE — Progress Notes (Signed)
YPatient ID: Eric Jefferson, male   DOB: 1961/10/03, 55 y.o.   MRN: [id number]  Eric Jefferson came in today as a walk in requesting to see [place].  I saw him since [place] is not here.  He tells me that [facility] told him that they could not fill his Prezcobix, Tivicay, and Descovy. I called [facility] on [address] (he has [facility] different [facility] on [facility] pharmacy profile), and they said the prescriptions were active at the one on [address].  I called [facility] and they told me the Prezcobix was at the [facility] location and the other two were at their location.  They transferred the Prezcobix and then told me they ran it through ADAP and it said med not covered.  Spoke with Juliann Pulse and she will call [facility] and give them his ADAP case number since his ADAP is active until March. He has an appointment with Dr. [name] on 10/9. I explained to him how to get to the [facility] location and to call or come back to see me if he still cannot pick up his medications.

## 2016-08-08 ENCOUNTER — Encounter: Payer: Self-pay | Admitting: Infectious Diseases

## 2016-08-08 ENCOUNTER — Ambulatory Visit (INDEPENDENT_AMBULATORY_CARE_PROVIDER_SITE_OTHER): Payer: Medicaid Other | Admitting: Infectious Diseases

## 2016-08-08 VITALS — BP 122/78 | HR 66 | Temp 97.9°F | Wt 111.0 lb

## 2016-08-08 DIAGNOSIS — R1013 Epigastric pain: Secondary | ICD-10-CM | POA: Diagnosis not present

## 2016-08-08 DIAGNOSIS — H6592 Unspecified nonsuppurative otitis media, left ear: Secondary | ICD-10-CM | POA: Diagnosis not present

## 2016-08-08 DIAGNOSIS — R1033 Periumbilical pain: Secondary | ICD-10-CM | POA: Diagnosis not present

## 2016-08-08 DIAGNOSIS — N179 Acute kidney failure, unspecified: Secondary | ICD-10-CM | POA: Diagnosis not present

## 2016-08-08 DIAGNOSIS — B20 Human immunodeficiency virus [HIV] disease: Secondary | ICD-10-CM | POA: Diagnosis present

## 2016-08-08 DIAGNOSIS — Z113 Encounter for screening for infections with a predominantly sexual mode of transmission: Secondary | ICD-10-CM | POA: Diagnosis not present

## 2016-08-08 DIAGNOSIS — H6500 Acute serous otitis media, unspecified ear: Secondary | ICD-10-CM | POA: Diagnosis not present

## 2016-08-08 DIAGNOSIS — Z23 Encounter for immunization: Secondary | ICD-10-CM | POA: Diagnosis not present

## 2016-08-08 DIAGNOSIS — Z79899 Other long term (current) drug therapy: Secondary | ICD-10-CM | POA: Diagnosis not present

## 2016-08-08 DIAGNOSIS — F17200 Nicotine dependence, unspecified, uncomplicated: Secondary | ICD-10-CM

## 2016-08-08 LAB — BASIC METABOLIC PANEL
BUN: 16 mg/dL (ref 7–25)
CHLORIDE: 103 mmol/L (ref 98–110)
CO2: 27 mmol/L (ref 20–31)
Calcium: 9.1 mg/dL (ref 8.6–10.3)
Creat: 1.31 mg/dL (ref 0.70–1.33)
Glucose, Bld: 73 mg/dL (ref 65–99)
POTASSIUM: 4.1 mmol/L (ref 3.5–5.3)
SODIUM: 137 mmol/L (ref 135–146)

## 2016-08-08 NOTE — Assessment & Plan Note (Signed)
Suspect due to Eric Jefferson recheck today.

## 2016-08-08 NOTE — Assessment & Plan Note (Signed)
Has quit 

## 2016-08-08 NOTE — Assessment & Plan Note (Signed)
He is doing very well Will ask Eric Jefferson to f/u.  Flu shot today.  Has condoms Will see back in 6 months Check labs today.

## 2016-08-08 NOTE — Progress Notes (Signed)
   Subjective:    Patient ID: Eric Jefferson, male    DOB: 24-Jan-1961, 55 y.o.   MRN: [id number]  HPI 55 yo [place] M with HIV/AIDS, previously treated TB, adm to [facility] 2-23 to 12-29-12 with worsening SOB and nodular lung disease. His BAL grew histo and he was started on itraconazole.  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 He has been changed to stribilid. He had persistently elevated HIV RNA and DRV was added to his stribilid.  He had genoytpe 06-22-15 showing: Reverse Transcriptase Gene: Y181C, L210W, T215D  Protease Gene: A71V   He had pharm visit tivicay/prezcobix/Descovey [id number]) .   HIV 1 RNA Quant (copies/mL)  Date Value  12/23/2015 <20  10/22/2015 29 (H)  08/24/2015 85,489 (H)   CD4 T Cell Abs (/uL)  Date Value  12/23/2015 540  10/22/2015 480  08/24/2015 180 (L)   He has been taking his meds well. He denies missed doses.  Has been feeling well.  Has had f/u with dentist, not recently.  Review of Systems  Constitutional: Negative for appetite change and unexpected weight change.  Respiratory: Negative for cough and shortness of breath.   Gastrointestinal: Negative for constipation and diarrhea.  Genitourinary: Negative for difficulty urinating.  Neurological: Negative for headaches.       Objective:   Physical Exam  Constitutional: He appears well-developed and well-nourished.  HENT:  Mouth/Throat: No oropharyngeal exudate.  Eyes: EOM are normal. Pupils are equal, round, and reactive to light.  Neck: Neck supple.  Cardiovascular: Normal rate, regular rhythm and normal heart sounds.   Pulmonary/Chest: Effort normal and breath sounds normal.  Abdominal: Soft. Bowel sounds are normal. There is no tenderness. There is no rebound.  Musculoskeletal: He exhibits no edema.  Lymphadenopathy:    He has no cervical adenopathy.      Assessment & Plan:

## 2016-08-09 LAB — HIV-1 RNA QUANT-NO REFLEX-BLD

## 2016-08-10 LAB — T-HELPER CELL (CD4) - (RCID CLINIC ONLY)
CD4 T CELL ABS: 700 /uL (ref 400–2700)
CD4 T CELL HELPER: 17 % — AB (ref 33–55)

## 2016-09-10 ENCOUNTER — Other Ambulatory Visit: Payer: Self-pay | Admitting: Infectious Diseases

## 2016-09-10 DIAGNOSIS — B20 Human immunodeficiency virus [HIV] disease: Secondary | ICD-10-CM

## 2016-11-29 ENCOUNTER — Encounter (HOSPITAL_COMMUNITY): Payer: Self-pay | Admitting: Emergency Medicine

## 2016-11-29 ENCOUNTER — Ambulatory Visit (INDEPENDENT_AMBULATORY_CARE_PROVIDER_SITE_OTHER): Payer: Self-pay | Admitting: Pharmacist Clinician (PhC)/ Clinical Pharmacy Specialist

## 2016-11-29 ENCOUNTER — Ambulatory Visit (HOSPITAL_COMMUNITY)
Admission: EM | Admit: 2016-11-29 | Discharge: 2016-11-29 | Disposition: A | Discharge status: Home / Self Care | Payer: Medicaid Other | Attending: Family Medicine | Admitting: Family Medicine

## 2016-11-29 ENCOUNTER — Ambulatory Visit (INDEPENDENT_AMBULATORY_CARE_PROVIDER_SITE_OTHER): Payer: Self-pay

## 2016-11-29 DIAGNOSIS — H6500 Acute serous otitis media, unspecified ear: Secondary | ICD-10-CM

## 2016-11-29 DIAGNOSIS — Z23 Encounter for immunization: Secondary | ICD-10-CM

## 2016-11-29 DIAGNOSIS — Z79899 Other long term (current) drug therapy: Secondary | ICD-10-CM

## 2016-11-29 DIAGNOSIS — S46911A Strain of unspecified muscle, fascia and tendon at shoulder and upper arm level, right arm, initial encounter: Secondary | ICD-10-CM

## 2016-11-29 DIAGNOSIS — H6592 Unspecified nonsuppurative otitis media, left ear: Secondary | ICD-10-CM

## 2016-11-29 DIAGNOSIS — B20 Human immunodeficiency virus [HIV] disease: Secondary | ICD-10-CM

## 2016-11-29 DIAGNOSIS — R1013 Epigastric pain: Secondary | ICD-10-CM

## 2016-11-29 DIAGNOSIS — Z113 Encounter for screening for infections with a predominantly sexual mode of transmission: Secondary | ICD-10-CM

## 2016-11-29 DIAGNOSIS — R1033 Periumbilical pain: Secondary | ICD-10-CM

## 2016-11-29 MED ORDER — DICLOFENAC POTASSIUM 50 MG PO TABS: 50.0000 mg | ORAL_TABLET | Freq: Three times a day (TID) | ORAL | 0 refills | Status: DC

## 2016-11-29 NOTE — Discharge Instructions (Signed)
Ice, sling and medicine as needed, see orthopedist next week for recheck if further problems.

## 2016-11-29 NOTE — Progress Notes (Signed)
HPI: Eric Jefferson is a 56 y.o. male who walked in today for a recent issue after a fall.   Allergies: No Known Allergies  Vitals: Temp: 97.8 F (36.6 C) (01/30 1027) Temp Source: Oral (01/30 1027) BP: 122/88 (01/30 1027) Pulse Rate: 60 (01/30 1027)  Past Medical History: Past Medical History:  Diagnosis Date  . Diabetes ([place])   . HIV (human immunodeficiency virus infection) ([place])   . TB (pulmonary tuberculosis)     Social History: Social History   Social History  . Marital status: Single    Spouse name: N/A  . Number of children: 6  . Years of education: N/A   Occupational History  .      [employer]   Social History Main Topics  . Smoking status: Current Every Day Smoker    Packs/day: 0.60    Years: 30.00    Types: Cigarettes  . Smokeless tobacco: Never Used  . Alcohol use No     Comment: former  . Drug use: No  . Sexual activity: Yes     Comment: given condoms   Other Topics Concern  . Not on file   Social History Narrative  . No narrative on file    Previous Regimen:   Current Regimen: Descovy/Prez/DTG  Labs: HIV 1 RNA Quant (copies/mL)  Date Value  08/08/2016 <20  12/23/2015 <20  10/22/2015 29 (H)   CD4 T Cell Abs (/uL)  Date Value  08/08/2016 700  12/23/2015 540  10/22/2015 480   Hep B S Ab (no units)  Date Value  05/09/2008 Negative   Hepatitis B Surface Ag (no units)  Date Value  01/07/2009 NEG   HCV Ab (no units)  Date Value  05/09/2008 Negative    CrCl: CrCl cannot be calculated (Patient's most recent lab result is older than the maximum 21 days allowed.).  Lipids:    Component Value Date/Time   CHOL 218 (H) 12/23/2015 1523   TRIG 250 (H) 12/23/2015 1523   HDL 43 12/23/2015 1523   CHOLHDL 5.1 (H) 12/23/2015 1523   VLDL 50 (H) 12/23/2015 1523   LDLCALC 125 12/23/2015 1523    Assessment: [name] walked in this morning because he had a recent fall and landed on his right shoulder. He complained of pain with raising his  arm. He has taken 1 dose of ibuprofen. He asked [place] about what to do next. I advised him to go to urgent care to get his eval rather than the ED. He listens very well to our recommendation. I see that he is currently being seen at the [facility] urgent care now. Before he left, I asked him if his wife has ever been tested for HIV. He said no. Therefore, I told him to bring her back so we can test her.   FYI - he doesn't have active medicaid any longer. He only has family planning medicaid. He gets his HIV meds through ADAP.   Recommendations:  Cont his HIV regimen Bring back his wife for HIV testing Go to urgent care at [facility] to get shoulder evaluate.   [name], PharmD, BCPS, AAHIVP, CPP Clinical [facility] 11/29/2016, 12:11 PM

## 2016-11-29 NOTE — ED Triage Notes (Signed)
Pt reports he he fell while going up a step inside home  inj his right shoulder... Pain increases w/activity  A&O x4... NAD

## 2016-11-29 NOTE — ED Provider Notes (Signed)
[place]    CSN: [id number] Arrival date & time: 11/29/16  [id number]     History   Chief Complaint Chief Complaint  Patient presents with  . Shoulder Pain    HPI Eric Jefferson is a 56 y.o. male.   The history is provided by the patient.  Shoulder Pain  Location:  Shoulder Shoulder location:  R shoulder Injury: yes   Mechanism of injury: fall   Pain details:    Severity:  Moderate   Onset quality:  Sudden   Progression:  Unchanged Dislocation: no   Foreign body present:  No foreign bodies Relieved by:  Nothing Worsened by:  Movement Associated symptoms: decreased range of motion   Associated symptoms: no back pain and no neck pain     Past Medical History:  Diagnosis Date  . Diabetes ([place])   . HIV (human immunodeficiency virus infection) ([place])   . TB (pulmonary tuberculosis)     Patient Active Problem List   Diagnosis Date Noted  . AKI (acute kidney injury) ([place]) 08/08/2016  . Anorexia 07/15/2015  . Abdominal pain 02/09/2015  . Otitis media 02/09/2015  . Loss of weight 12/24/2012  . 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 Dental caries 09/09/2009  . Human immunodeficiency virus (HIV) disease ([place]) 05/06/2009  . PULMONARY TUBERCULOSIS 05/09/2008  . TOBACCO ABUSE 02/04/2008  . GERD 02/04/2008  . Nonspecific reaction to tuberculin skin test without active tuberculosis(795.51) 01/08/2008    History reviewed. No pertinent surgical history.     Home Medications    Prior to Admission medications   Medication Sig Start Date End Date Taking? Authorizing Provider  DESCOVY 200-25 MG tablet TAKE 1 TABLET BY MOUTH DAILY WITH DINNER 09/12/16   Thayer Headings, MD  diclofenac (CATAFLAM) 50 MG tablet Take 1 tablet (50 mg total) by mouth 3 (three) times daily. 11/29/16   Billy Fischer, MD  mirtazapine (REMERON) 15 MG tablet Take 1 tablet (15 mg total) by mouth at bedtime. 10/22/15   [name], MD  pantoprazole (PROTONIX) 40 MG tablet  Take 1 tablet (40 mg total) by mouth daily. 02/09/15   [name], MD  PREZCOBIX 800-150 MG tablet TAKE 1 TABLET BY MOUTH DAILY WITH SUPPER. SWALLOW WHOLE, DO NOT CRUSH, BREAK OR CHEW TABLETS. TAKE WITH FOOD 09/12/16   Thayer Headings, MD  TIVICAY 50 MG tablet TAKE 1 TABLET BY MOUTH DAILY WITH SUPPER*DISCONTINUE PREZISTA/GENVOYA* 09/12/16   Thayer Headings, MD    Family History History reviewed. No pertinent family history.  Social History Social History  Substance Use Topics  . Smoking status: Current Every Day Smoker    Packs/day: 0.60    Years: 30.00    Types: Cigarettes  . Smokeless tobacco: Never Used  . Alcohol use No     Comment: former     Allergies   Patient has no known allergies.   Review of Systems Review of Systems  Constitutional: Negative.   HENT: Negative.   Musculoskeletal: Positive for joint swelling and myalgias. Negative for back pain and neck pain.  Skin: Negative.   Neurological: Negative.   All other systems reviewed and are negative.    Physical Exam Triage Vital Signs ED Triage Vitals  Enc Vitals Group     BP 11/29/16 1027 122/88     Pulse Rate 11/29/16 1027 60     Resp 11/29/16 1027 20     Temp 11/29/16 1027 97.8 F (36.6 C)  Temp Source 11/29/16 1027 Oral     SpO2 11/29/16 1027 100 %     Weight --      Height --      Head Circumference --      Peak Flow --      Pain Score 11/29/16 1025 9     Pain Loc --      Pain Edu? --      Excl. in [place]? --    No data found.   Updated Vital Signs BP 122/88 (BP Location: Left Arm)   Pulse 60   Temp 97.8 F (36.6 C) (Oral)   Resp 20   SpO2 100%   Visual Acuity Right Eye Distance:   Left Eye Distance:   Bilateral Distance:    Right Eye Near:   Left Eye Near:    Bilateral Near:     Physical Exam  Constitutional: He appears well-developed and well-nourished.  Nursing note and vitals reviewed.    UC Treatments / Results  Labs (all labs ordered are listed, but only  abnormal results are displayed) Labs Reviewed - No data to display  EKG  EKG Interpretation None       Radiology No results found. X-rays reviewed and report per radiologist.  Procedures Procedures (including critical care time)  Medications Ordered in UC Medications - No data to display   Initial Impression / Assessment and Plan / UC Course  I have reviewed the triage vital signs and the nursing notes.  Pertinent labs & imaging results that were available during my care of the patient were reviewed by me and considered in my medical decision making (see chart for details).       Final Clinical Impressions(s) / UC Diagnoses   Final diagnoses:  Strain of right shoulder, initial encounter    New Prescriptions Discharge Medication List as of 11/29/2016 11:52 AM    START taking these medications   Details  diclofenac (CATAFLAM) 50 MG tablet Take 1 tablet (50 mg total) by mouth 3 (three) times daily., Starting Tue 11/29/2016, Print         Billy Fischer, MD 12/01/16 [phone]

## 2017-01-23 ENCOUNTER — Other Ambulatory Visit: Payer: Medicaid Other

## 2017-02-06 ENCOUNTER — Encounter: Payer: Self-pay | Admitting: Infectious Diseases

## 2017-02-06 ENCOUNTER — Other Ambulatory Visit (HOSPITAL_COMMUNITY)
Admission: RE | Admit: 2017-02-06 | Discharge: 2017-02-06 | Disposition: A | Discharge status: Home / Self Care | Payer: Medicaid Other | Source: Ambulatory Visit | Attending: Infectious Diseases | Admitting: Infectious Diseases

## 2017-02-06 ENCOUNTER — Ambulatory Visit (INDEPENDENT_AMBULATORY_CARE_PROVIDER_SITE_OTHER): Payer: Self-pay | Admitting: Infectious Diseases

## 2017-02-06 VITALS — BP 130/89 | HR 105 | Temp 98.4°F | Wt 114.0 lb

## 2017-02-06 DIAGNOSIS — Z79899 Other long term (current) drug therapy: Secondary | ICD-10-CM

## 2017-02-06 DIAGNOSIS — F17200 Nicotine dependence, unspecified, uncomplicated: Secondary | ICD-10-CM

## 2017-02-06 DIAGNOSIS — R634 Abnormal weight loss: Secondary | ICD-10-CM

## 2017-02-06 DIAGNOSIS — K029 Dental caries, unspecified: Secondary | ICD-10-CM

## 2017-02-06 DIAGNOSIS — B20 Human immunodeficiency virus [HIV] disease: Secondary | ICD-10-CM

## 2017-02-06 DIAGNOSIS — Z113 Encounter for screening for infections with a predominantly sexual mode of transmission: Secondary | ICD-10-CM

## 2017-02-06 LAB — COMPREHENSIVE METABOLIC PANEL
ALBUMIN: 4.3 g/dL (ref 3.6–5.1)
ALT: 11 U/L (ref 9–46)
AST: 22 U/L (ref 10–35)
Alkaline Phosphatase: 86 U/L (ref 40–115)
BUN: 19 mg/dL (ref 7–25)
CALCIUM: 8.8 mg/dL (ref 8.6–10.3)
CHLORIDE: 104 mmol/L (ref 98–110)
CO2: 27 mmol/L (ref 20–31)
CREATININE: 1.2 mg/dL (ref 0.70–1.33)
Glucose, Bld: 114 mg/dL — ABNORMAL HIGH (ref 65–99)
Potassium: 4.1 mmol/L (ref 3.5–5.3)
Sodium: 140 mmol/L (ref 135–146)
TOTAL PROTEIN: 7.4 g/dL (ref 6.1–8.1)
Total Bilirubin: 0.5 mg/dL (ref 0.2–1.2)

## 2017-02-06 LAB — CBC
HCT: 39.8 % (ref 38.5–50.0)
HEMOGLOBIN: 13 g/dL — AB (ref 13.2–17.1)
MCH: 29.4 pg (ref 27.0–33.0)
MCHC: 32.7 g/dL (ref 32.0–36.0)
MCV: 90 fL (ref 80.0–100.0)
MPV: 10 fL (ref 7.5–12.5)
Platelets: 298 10*3/uL (ref 140–400)
RBC: 4.42 MIL/uL (ref 4.20–5.80)
RDW: 15.1 % — ABNORMAL HIGH (ref 11.0–15.0)
WBC: 7.3 10*3/uL (ref 3.8–10.8)

## 2017-02-06 LAB — LIPID PANEL
CHOL/HDL RATIO: 4.7 ratio (ref <5.0)
CHOLESTEROL: 241 mg/dL — AB (ref <200)
HDL: 51 mg/dL (ref >40)
LDL Cholesterol: 150 mg/dL — ABNORMAL HIGH (ref <100)
Triglycerides: 198 mg/dL — ABNORMAL HIGH (ref <150)
VLDL: 40 mg/dL — ABNORMAL HIGH (ref <30)

## 2017-02-06 NOTE — Assessment & Plan Note (Signed)
He is doing very well thanks to oiur excellent pharmacist Will check his labs today Offered/refused condoms.  Will get his wife in for testing.  rtc in 6 months.

## 2017-02-06 NOTE — Assessment & Plan Note (Signed)
This is significantly improved thanks to dental evals

## 2017-02-06 NOTE — Progress Notes (Signed)
   Subjective:    Patient ID: Eric Jefferson, male    DOB: [birth year], 56 y.o.   MRN: [id number]  HPI 56 yo [place] M with HIV/AIDS, previously treated TB, adm to [facility] 2-23 to 12-29-12 with worsening SOB and nodular lung disease. His BAL grew histo and he was started on itraconazole.  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 He has been changed to stribilid. He had persistently elevated HIV RNA and DRV was added to his stribilid.  He had genoytpe 06-22-15 showing: Reverse Transcriptase Gene: Y181C, L210W, T215D  Protease Gene: A71V   He is seen with Eric Jefferson. He has been feeling well. No missed meds.  Missed flu shot.  He is taking DTGV/DESC/DRV.   HIV 1 RNA Quant (copies/mL)  Date Value  08/08/2016 <20  12/23/2015 <20  10/22/2015 29 (H)   CD4 T Cell Abs (/uL)  Date Value  08/08/2016 700  12/23/2015 540  10/22/2015 480    Review of Systems  Constitutional: Negative for appetite change and unexpected weight change.  Respiratory: Negative for cough and shortness of breath.   Gastrointestinal: Negative for constipation and diarrhea.  Genitourinary: Negative for difficulty urinating.  occsaional cough, no sob.      Objective:   Physical Exam  Constitutional: He appears well-developed and well-nourished.  HENT:  Mouth/Throat: No oropharyngeal exudate.  Eyes: EOM are normal. Pupils are equal, round, and reactive to light.  Neck: Neck supple.  Cardiovascular: Normal rate, regular rhythm and normal heart sounds.   Pulmonary/Chest: Effort normal and breath sounds normal.  Abdominal: Soft. Bowel sounds are normal. There is no tenderness. There is no rebound.  Musculoskeletal: He exhibits no edema.  Lymphadenopathy:    He has no cervical adenopathy.      Assessment & Plan:

## 2017-02-06 NOTE — Assessment & Plan Note (Signed)
Wt has stabilised.

## 2017-02-06 NOTE — Assessment & Plan Note (Signed)
Encouraged to quit smoking.  

## 2017-02-06 NOTE — Progress Notes (Addendum)
HPI: Eric Jefferson is a 56 y.o. male who is here for his routine HIV visit.   Allergies: No Known Allergies  Vitals: Temp: 98.4 F (36.9 C) (04/09 1512) Temp Source: Oral (04/09 1512) BP: 130/89 (04/09 1512) Pulse Rate: 105 (04/09 1512)  Past Medical History: Past Medical History:  Diagnosis Date  . Diabetes ([place])   . HIV (human immunodeficiency virus infection) ([place])   . TB (pulmonary tuberculosis)     Social History: Social History   Social History  . Marital status: Single    Spouse name: N/A  . Number of children: 6  . Years of education: N/A   Occupational History  .      temp agency   Social History Main Topics  . Smoking status: Current Every Day Smoker    Packs/day: 0.60    Years: 30.00    Types: Cigarettes  . Smokeless tobacco: Never Used  . Alcohol use No     Comment: former  . Drug use: No  . Sexual activity: Yes     Comment: given condoms   Other Topics Concern  . None   Social History Narrative  . None    Previous Regimen: Stribild  Current Regimen: Prezcobix/DTG/Descovy  Labs: HIV 1 RNA Quant (copies/mL)  Date Value  08/08/2016 <20  12/23/2015 <20  10/22/2015 29 (H)   CD4 T Cell Abs (/uL)  Date Value  08/08/2016 700  12/23/2015 540  10/22/2015 480   Hep B S Ab (no units)  Date Value  05/09/2008 Negative   Hepatitis B Surface Ag (no units)  Date Value  01/07/2009 NEG   HCV Ab (no units)  Date Value  05/09/2008 Negative    CrCl: CrCl cannot be calculated (Patient's most recent lab result is older than the maximum 21 days allowed.).  Lipids:    Component Value Date/Time   CHOL 218 (H) 12/23/2015 1523   TRIG 250 (H) 12/23/2015 1523   HDL 43 12/23/2015 1523   CHOLHDL 5.1 (H) 12/23/2015 1523   VLDL 50 (H) 12/23/2015 1523   LDLCALC 125 12/23/2015 1523   HIV Genotype Composite Data Genotype Dates:   Mutations in Bold impact drug susceptibility RT Mutations L210W, T215D, Y181C  PI Mutations M46L  Integrase  Mutations [id number]   Interpretation of Genotype Data per Stanford HIV Database Nucleoside RTIs  abacavir (ABC) Low-Level Resistance zidovudine (AZT) Intermediate Resistance emtricitabine (FTC) Susceptible lamivudine (3TC) Susceptible tenofovir (TDF) Low-Level Resistance   Non-Nucleoside RTIs  efavirenz (EFV) Intermediate Resistance etravirine (ETR) Intermediate Resistance nevirapine (NVP) High-Level Resistance rilpivirine (RPV) Intermediate Resistance   Protease Inhibitors  atazanavir/r (ATV/r) Potential Low-Level Resistance darunavir/r (DRV/r) Susceptible lopinavir/r (LPV/r) Potential Low-Level Resistance   Integrase Inhibitors  dolutegravir (DTG) Susceptible elvitegravir (EVG) High-Level Resistance raltegravir (RAL) Susceptible   Assessment: Eric Jefferson is doing great on his ART. He has gained back a good amount of weight. His virus has been suppressed since Feb 2017. He has not missed any doses. He is married but his wife has never been tested. D/w with Dr. [name], we are going to bring her back tomorrow to do oral testing. Eric Jefferson said it was ok to do.   Recommendations:  Cont current regimen Bring wife back tomorrow for testing  Eric Jefferson, PharmD, BCPS, AAHIVP, CPP Clinical [facility] 02/06/2017, 4:27 PM

## 2017-02-07 LAB — RPR

## 2017-02-08 ENCOUNTER — Telehealth: Payer: Self-pay | Admitting: Pharmacist Clinician (PhC)/ Clinical Pharmacy Specialist

## 2017-02-08 LAB — T-HELPER CELL (CD4) - (RCID CLINIC ONLY)
CD4 % Helper T Cell: 19 % — ABNORMAL LOW (ref 33–55)
CD4 T Cell Abs: 650 /uL (ref 400–2700)

## 2017-02-08 LAB — URINE CYTOLOGY ANCILLARY ONLY
CHLAMYDIA, DNA PROBE: NEGATIVE
Neisseria Gonorrhea: NEGATIVE

## 2017-02-08 LAB — HIV-1 RNA QUANT-NO REFLEX-BLD
HIV 1 RNA QUANT: DETECTED {copies}/mL — AB
HIV-1 RNA QUANT, LOG: DETECTED {Log_copies}/mL — AB

## 2017-02-08 NOTE — Telephone Encounter (Signed)
Eric Jefferson brought his wife back for HIV testing. Eric Jefferson did the oral test and it came back neg. Explained the result to her.

## 2017-02-14 ENCOUNTER — Encounter: Payer: Self-pay | Admitting: Infectious Diseases

## 2017-04-01 ENCOUNTER — Other Ambulatory Visit: Payer: Self-pay | Admitting: Internal Medicine

## 2017-04-01 DIAGNOSIS — B20 Human immunodeficiency virus [HIV] disease: Secondary | ICD-10-CM

## 2017-04-03 ENCOUNTER — Other Ambulatory Visit: Payer: Self-pay | Admitting: Pharmacist Clinician (PhC)/ Clinical Pharmacy Specialist

## 2017-04-03 DIAGNOSIS — B20 Human immunodeficiency virus [HIV] disease: Secondary | ICD-10-CM

## 2017-04-03 MED ORDER — DOLUTEGRAVIR SODIUM 50 MG PO TABS: ORAL_TABLET | ORAL | 11 refills | Status: DC

## 2017-04-03 MED ORDER — EMTRICITABINE-TENOFOVIR AF 200-25 MG PO TABS: ORAL_TABLET | ORAL | 11 refills | Status: DC

## 2017-04-03 MED ORDER — DARUNAVIR-COBICISTAT 800-150 MG PO TABS: 1.0000 | ORAL_TABLET | Freq: Every day | ORAL | 11 refills | Status: DC

## 2017-04-03 NOTE — Progress Notes (Unsigned)
Sending in more refills.

## 2017-09-14 ENCOUNTER — Encounter (HOSPITAL_COMMUNITY): Payer: Self-pay

## 2017-09-14 ENCOUNTER — Emergency Department (HOSPITAL_COMMUNITY): Payer: Medicaid Other

## 2017-09-14 ENCOUNTER — Other Ambulatory Visit: Payer: Self-pay

## 2017-09-14 DIAGNOSIS — R05 Cough: Secondary | ICD-10-CM | POA: Diagnosis present

## 2017-09-14 DIAGNOSIS — E119 Type 2 diabetes mellitus without complications: Secondary | ICD-10-CM | POA: Insufficient documentation

## 2017-09-14 DIAGNOSIS — B20 Human immunodeficiency virus [HIV] disease: Secondary | ICD-10-CM | POA: Diagnosis not present

## 2017-09-14 DIAGNOSIS — R600 Localized edema: Secondary | ICD-10-CM | POA: Diagnosis not present

## 2017-09-14 DIAGNOSIS — F17210 Nicotine dependence, cigarettes, uncomplicated: Secondary | ICD-10-CM | POA: Diagnosis not present

## 2017-09-14 DIAGNOSIS — J309 Allergic rhinitis, unspecified: Secondary | ICD-10-CM | POA: Insufficient documentation

## 2017-09-14 DIAGNOSIS — Z79899 Other long term (current) drug therapy: Secondary | ICD-10-CM | POA: Insufficient documentation

## 2017-09-14 LAB — CBC WITH DIFFERENTIAL/PLATELET
BASOS ABS: 0 10*3/uL (ref 0.0–0.1)
Basophils Relative: 0 %
EOS ABS: 0.3 10*3/uL (ref 0.0–0.7)
EOS PCT: 3 %
HCT: 40.2 % (ref 39.0–52.0)
Hemoglobin: 13.4 g/dL (ref 13.0–17.0)
LYMPHS ABS: 4.1 10*3/uL — AB (ref 0.7–4.0)
Lymphocytes Relative: 49 %
MCH: 29.2 pg (ref 26.0–34.0)
MCHC: 33.3 g/dL (ref 30.0–36.0)
MCV: 87.6 fL (ref 78.0–100.0)
Monocytes Absolute: 0.6 10*3/uL (ref 0.1–1.0)
Monocytes Relative: 7 %
Neutro Abs: 3.4 10*3/uL (ref 1.7–7.7)
Neutrophils Relative %: 41 %
PLATELETS: 273 10*3/uL (ref 150–400)
RBC: 4.59 MIL/uL (ref 4.22–5.81)
RDW: 13.4 % (ref 11.5–15.5)
WBC: 8.4 10*3/uL (ref 4.0–10.5)

## 2017-09-14 LAB — COMPREHENSIVE METABOLIC PANEL
ALT: 14 U/L — ABNORMAL LOW (ref 17–63)
AST: 26 U/L (ref 15–41)
Albumin: 3.6 g/dL (ref 3.5–5.0)
Alkaline Phosphatase: 73 U/L (ref 38–126)
Anion gap: 5 (ref 5–15)
BILIRUBIN TOTAL: 0.4 mg/dL (ref 0.3–1.2)
BUN: 13 mg/dL (ref 6–20)
CALCIUM: 8.4 mg/dL — AB (ref 8.9–10.3)
CO2: 27 mmol/L (ref 22–32)
CREATININE: 0.98 mg/dL (ref 0.61–1.24)
Chloride: 105 mmol/L (ref 101–111)
GFR calc Af Amer: >60 mL/min (ref >60)
Glucose, Bld: 123 mg/dL — ABNORMAL HIGH (ref 65–99)
Potassium: 3.6 mmol/L (ref 3.5–5.1)
Sodium: 137 mmol/L (ref 135–145)
TOTAL PROTEIN: 7.1 g/dL (ref 6.5–8.1)

## 2017-09-14 LAB — URINALYSIS, ROUTINE W REFLEX MICROSCOPIC
BILIRUBIN URINE: NEGATIVE
Glucose, UA: NEGATIVE mg/dL
HGB URINE DIPSTICK: NEGATIVE
Ketones, ur: NEGATIVE mg/dL
Leukocytes, UA: NEGATIVE
Nitrite: NEGATIVE
Protein, ur: NEGATIVE mg/dL
SPECIFIC GRAVITY, URINE: 1.019 (ref 1.005–1.030)
pH: 6 (ref 5.0–8.0)

## 2017-09-14 NOTE — ED Triage Notes (Addendum)
Pt here with family, pt speaks R[place] and vietnamese. Family states that pt has had cold sx x 2 weeks and has been taking advil and dayquil/nyquil without relief. Pt began having facial swelling yesterday so they brought him in due to the facial swelling. Afebrile, VSS. Pt is HIV positive and not taking medicine.

## 2017-09-15 ENCOUNTER — Emergency Department (HOSPITAL_COMMUNITY)
Admission: EM | Admit: 2017-09-15 | Discharge: 2017-09-15 | Disposition: A | Discharge status: Home / Self Care | Payer: Medicaid Other | Attending: Emergency Medicine | Admitting: Emergency Medicine

## 2017-09-15 DIAGNOSIS — J302 Other seasonal allergic rhinitis: Secondary | ICD-10-CM

## 2017-09-15 DIAGNOSIS — R220 Localized swelling, mass and lump, head: Secondary | ICD-10-CM

## 2017-09-15 LAB — I-STAT CG4 LACTIC ACID, ED: Lactic Acid, Venous: 0.66 mmol/L (ref 0.5–1.9)

## 2017-09-15 MED ORDER — AZITHROMYCIN 250 MG PO TABS: 250.0000 mg | ORAL_TABLET | Freq: Every day | ORAL | 0 refills | Status: DC

## 2017-09-15 MED ORDER — CETIRIZINE HCL 10 MG PO TABS: 10.0000 mg | ORAL_TABLET | Freq: Every day | ORAL | 0 refills | Status: DC

## 2017-09-15 NOTE — ED Notes (Signed)
Pt departed in NAD, refused use of wheelchair.  

## 2017-09-15 NOTE — ED Notes (Signed)
ED Provider at bedside. 

## 2017-09-15 NOTE — ED Notes (Addendum)
Pt ambulated with pulse ox, saturations remained 97-98 on RA. Pt denies SHOB, and dizziness. Patients heart rate stayed between 68-75

## 2017-09-15 NOTE — ED Provider Notes (Signed)
[facility] EMERGENCY DEPARTMENT Provider Note   CSN: [id number] Arrival date & time: 09/14/17  2150     History   Chief Complaint Chief Complaint  Patient presents with  . Cough  . Facial Swelling    HPI Eric Jefferson is a 56 y.o. male.  URI, cough nasal congestion and tactile fever, chills , HA x 2 weeks. Facial swelling since yesterday. Bilateral maxillary swelling extending to eyes.He has been taking Ibuprofen Nyquil, Dayquil without relief. HIV positive who is compliant with medications and is followed regularly by Dr. [name], DM, TB history, continuous smoker. No N, V, D. No hemoptysis. He feels his hearing is decreased on the left which has happened in the past. He reports his eyes tear easily without purulent discharge.    The history is provided by the patient and a relative. A language interpreter was used (Daughter at bedside is acting as Astronomer.).  Cough  Associated symptoms include rhinorrhea. Pertinent negatives include no chest pain, no chills, no sore throat and no shortness of breath.    Past Medical History:  Diagnosis Date  . Diabetes ([place])   . HIV (human immunodeficiency virus infection) ([place])   . TB (pulmonary tuberculosis)     Patient Active Problem List   Diagnosis Date Noted  . AKI (acute kidney injury) ([place]) 08/08/2016  . Anorexia 07/15/2015  . Abdominal pain 02/09/2015  . Otitis media 02/09/2015  . Loss of weight 12/24/2012  . 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 Dental caries 09/09/2009  . Human immunodeficiency virus (HIV) disease ([place]) 05/06/2009  . PULMONARY TUBERCULOSIS 05/09/2008  . TOBACCO ABUSE 02/04/2008  . GERD 02/04/2008  . Nonspecific reaction to tuberculin skin test without active tuberculosis(795.51) 01/08/2008    History reviewed. No pertinent surgical history.     Home Medications    Prior to Admission medications   Medication Sig Start Date End Date Taking? Authorizing  Provider  darunavir-cobicistat (PREZCOBIX) 800-150 MG tablet Take 1 tablet by mouth daily with supper. Swallow whole. Do NOT crush, break or chew tablets. Take with food. 04/03/17   [name], MD  diclofenac (CATAFLAM) 50 MG tablet Take 1 tablet (50 mg total) by mouth 3 (three) times daily. 11/29/16   Billy Fischer, MD  dolutegravir (TIVICAY) 50 MG tablet TAKE 1 TABLET BY MOUTH DAILY WITH SUPPER*DISCONTINUE PREZISTA/GENVOYA* 04/03/17   [name], MD  emtricitabine-tenofovir AF (DESCOVY) 200-25 MG tablet TAKE 1 TABLET BY MOUTH DAILY WITH DINNER 04/03/17   [name], MD  mirtazapine (REMERON) 15 MG tablet Take 1 tablet (15 mg total) by mouth at bedtime. 10/22/15   [name], MD  pantoprazole (PROTONIX) 40 MG tablet Take 1 tablet (40 mg total) by mouth daily. 02/09/15   [name], MD    Family History History reviewed. No pertinent family history.  Social History Social History   Tobacco Use  . Smoking status: Current Every Day Smoker    Packs/day: 0.60    Years: 30.00    Pack years: 18.00    Types: Cigarettes  . Smokeless tobacco: Never Used  Substance Use Topics  . Alcohol use: No    Alcohol/week: 0.0 oz    Comment: former  . Drug use: No     Allergies   Patient has no known allergies.   Review of Systems Review of Systems  Constitutional: Negative for chills and fever.  HENT: Positive for congestion, facial swelling and rhinorrhea. Negative for sore throat and  trouble swallowing.   Eyes: Positive for discharge (See HPI.). Negative for photophobia, pain and visual disturbance.  Respiratory: Positive for cough. Negative for shortness of breath.   Cardiovascular: Negative.  Negative for chest pain.  Gastrointestinal: Negative.  Negative for diarrhea, nausea and vomiting.  Musculoskeletal: Negative.   Skin: Negative.   Neurological: Negative.      Physical Exam Updated Vital Signs BP 118/72 (BP Location: Right Arm)   Pulse (!) 57    Temp 98.1 F (36.7 C) (Oral)   Resp 14   Ht 5\' 5"  (1.651 m)   Wt 52.2 kg (115 lb)   SpO2 97%   BMI 19.14 kg/m   Physical Exam  Constitutional: He is oriented to person, place, and time. He appears well-developed and well-nourished. No distress.  HENT:  Head: Normocephalic.  Mouth/Throat: Oropharynx is clear and moist.  Symmetric central facial swelling that is nontender. Small chronic TM perf on left, right TM normal.   Eyes: Conjunctivae are normal. Pupils are equal, round, and reactive to light.  Neck: Normal range of motion. Neck supple.  Cardiovascular: Normal rate and regular rhythm.  Pulmonary/Chest: Effort normal and breath sounds normal. He has no wheezes. He has no rales.  Abdominal: Soft. Bowel sounds are normal. There is no tenderness. There is no rebound and no guarding.  Musculoskeletal: Normal range of motion. He exhibits no edema.  Neurological: He is alert and oriented to person, place, and time. No sensory deficit. He exhibits normal muscle tone. Coordination normal.  Skin: Skin is warm and dry. He is not diaphoretic. No erythema.  Psychiatric: He has a normal mood and affect.     ED Treatments / Results  Labs (all labs ordered are listed, but only abnormal results are displayed) Labs Reviewed  COMPREHENSIVE METABOLIC PANEL - Abnormal; Notable for the following components:      Result Value   Glucose, Bld 123 (*)    Calcium 8.4 (*)    ALT 14 (*)    All other components within normal limits  CBC WITH DIFFERENTIAL/PLATELET - Abnormal; Notable for the following components:   Lymphs Abs 4.1 (*)    All other components within normal limits  URINALYSIS, ROUTINE W REFLEX MICROSCOPIC  I-STAT CG4 LACTIC ACID, ED  I-STAT CG4 LACTIC ACID, ED   Results for orders placed or performed during the hospital encounter of 09/15/17  Comprehensive metabolic panel  Result Value Ref Range   Sodium 137 135 - 145 mmol/L   Potassium 3.6 3.5 - 5.1 mmol/L   Chloride 105 101 -  111 mmol/L   CO2 27 22 - 32 mmol/L   Glucose, Bld 123 (H) 65 - 99 mg/dL   BUN 13 6 - 20 mg/dL   Creatinine, Ser 0.98 0.61 - 1.24 mg/dL   Calcium 8.4 (L) 8.9 - 10.3 mg/dL   Total Protein 7.1 6.5 - 8.1 g/dL   Albumin 3.6 3.5 - 5.0 g/dL   AST 26 15 - 41 U/L   ALT 14 (L) 17 - 63 U/L   Alkaline Phosphatase 73 38 - 126 U/L   Total Bilirubin 0.4 0.3 - 1.2 mg/dL   GFR calc non Af Amer >60 >60 mL/min   GFR calc Af Amer >60 >60 mL/min   Anion gap 5 5 - 15  CBC with Differential  Result Value Ref Range   WBC 8.4 4.0 - 10.5 K/uL   RBC 4.59 4.22 - 5.81 MIL/uL   Hemoglobin 13.4 13.0 - 17.0 g/dL   HCT  40.2 39.0 - 52.0 %   MCV 87.6 78.0 - 100.0 fL   MCH 29.2 26.0 - 34.0 pg   MCHC 33.3 30.0 - 36.0 g/dL   RDW 13.4 11.5 - 15.5 %   Platelets 273 150 - 400 K/uL   Neutrophils Relative % 41 %   Neutro Abs 3.4 1.7 - 7.7 K/uL   Lymphocytes Relative 49 %   Lymphs Abs 4.1 (H) 0.7 - 4.0 K/uL   Monocytes Relative 7 %   Monocytes Absolute 0.6 0.1 - 1.0 K/uL   Eosinophils Relative 3 %   Eosinophils Absolute 0.3 0.0 - 0.7 K/uL   Basophils Relative 0 %   Basophils Absolute 0.0 0.0 - 0.1 K/uL  Urinalysis, Routine w reflex microscopic  Result Value Ref Range   Color, Urine YELLOW YELLOW   APPearance CLEAR CLEAR   Specific Gravity, Urine 1.019 1.005 - 1.030   pH 6.0 5.0 - 8.0   Glucose, UA NEGATIVE NEGATIVE mg/dL   Hgb urine dipstick NEGATIVE NEGATIVE   Bilirubin Urine NEGATIVE NEGATIVE   Ketones, ur NEGATIVE NEGATIVE mg/dL   Protein, ur NEGATIVE NEGATIVE mg/dL   Nitrite NEGATIVE NEGATIVE   Leukocytes, UA NEGATIVE NEGATIVE  I-Stat CG4 Lactic Acid, ED  Result Value Ref Range   Lactic Acid, Venous 0.66 0.5 - 1.9 mmol/L    EKG  EKG Interpretation None       Radiology Dg Chest 2 View  Result Date: 09/14/2017 CLINICAL DATA:  Cough EXAM: CHEST  2 VIEW COMPARISON:  11/21/2013 FINDINGS: The heart size and mediastinal contours are within normal limits. Both lungs are clear. The visualized  skeletal structures are unremarkable. IMPRESSION: No active cardiopulmonary disease. Electronically Signed   By: Donavan Foil M.D.   On: 09/14/2017 22:48    Procedures Procedures (including critical care time)  Medications Ordered in ED Medications - No data to display   Initial Impression / Assessment and Plan / ED Course  I have reviewed the triage vital signs and the nursing notes.  Pertinent labs & imaging results that were available during my care of the patient were reviewed by me and considered in my medical decision making (see chart for details).     Patient presents for concern and evaluation of facial swelling starting yesterday. He has had URI symptoms for the past 2 weeks without fever.   He is describing allergy symptoms. He appears otherwise well, nontoxic. Facial swelling is significantly better per daughter, wtihout intervention. No clinical evidence of cellulitis, orbital or preseptal cellulitis.   He can be discharge home. Concern for bradycardia on discharge. No lightheadedness here. He ambulates without dizziness, heart rate with ambulation normal. EKG without acute change.   Encouraged to follow up with PCP. Will recommend Zyrtec for allergy symptoms.   Final Clinical Impressions(s) / ED Diagnoses   Final diagnoses:  None    1. Facial swelling 2. Allergies   ED Discharge Orders    None       Charlann Lange, PA-C 09/16/17 [place], Delice Bison, DO 09/16/17 [phone]

## 2017-12-08 ENCOUNTER — Telehealth: Payer: Self-pay | Admitting: Pharmacist Clinician (PhC)/ Clinical Pharmacy Specialist

## 2017-12-08 NOTE — Telephone Encounter (Signed)
Leelynd called this AM and said that he can't pick up his ART from [facility] for some reason. I thought he has ADAP at first but he has Medicaid. [facility] has it there. Tried to call him back multiple times but no answer. He might be at work. Will try again Monday.

## 2017-12-11 ENCOUNTER — Other Ambulatory Visit: Payer: Self-pay

## 2017-12-11 ENCOUNTER — Ambulatory Visit: Payer: Medicaid Other

## 2017-12-11 DIAGNOSIS — B20 Human immunodeficiency virus [HIV] disease: Secondary | ICD-10-CM

## 2017-12-11 MED ORDER — EMTRICITABINE-TENOFOVIR AF 200-25 MG PO TABS: ORAL_TABLET | ORAL | 11 refills | Status: DC

## 2017-12-11 MED ORDER — DOLUTEGRAVIR SODIUM 50 MG PO TABS: ORAL_TABLET | ORAL | 11 refills | Status: DC

## 2017-12-11 MED ORDER — DARUNAVIR-COBICISTAT 800-150 MG PO TABS: 1.0000 | ORAL_TABLET | Freq: Every day | ORAL | 11 refills | Status: DC

## 2017-12-11 NOTE — Telephone Encounter (Signed)
Patient walked into clinic with complaint his medications are not at pharmacy.  There is a note verifying his regimen has been sent to pharmacy but he says it is not there.   Tivicay, Prezcobix and Descovy sent to pharmacy.   Laverle Patter, RN

## 2017-12-14 ENCOUNTER — Other Ambulatory Visit: Payer: Self-pay | Admitting: Pharmacist Clinician (PhC)/ Clinical Pharmacy Specialist

## 2017-12-14 NOTE — Telephone Encounter (Addendum)
[facility] said that he did pick up his ART on 2/11.   Addendum  Shigeo called back and made him a f/u appt with Dr. [name] in March.

## 2018-01-15 ENCOUNTER — Ambulatory Visit: Payer: Self-pay | Admitting: Infectious Diseases

## 2018-01-15 ENCOUNTER — Ambulatory Visit (INDEPENDENT_AMBULATORY_CARE_PROVIDER_SITE_OTHER): Payer: Medicaid Other | Admitting: Pharmacist Clinician (PhC)/ Clinical Pharmacy Specialist

## 2018-01-15 ENCOUNTER — Other Ambulatory Visit: Payer: Self-pay | Admitting: Pharmacist Clinician (PhC)/ Clinical Pharmacy Specialist

## 2018-01-15 DIAGNOSIS — B20 Human immunodeficiency virus [HIV] disease: Secondary | ICD-10-CM | POA: Diagnosis present

## 2018-01-15 MED ORDER — DARUN-COBIC-EMTRICIT-TENOFAF 800-150-200-10 MG PO TABS: 1.0000 | ORAL_TABLET | Freq: Every day | ORAL | 11 refills | Status: DC

## 2018-01-15 MED ORDER — ATORVASTATIN CALCIUM 10 MG PO TABS: 10.0000 mg | ORAL_TABLET | Freq: Every day | ORAL | 4 refills | Status: DC

## 2018-01-15 NOTE — Progress Notes (Signed)
HPI: Eric Jefferson is a 57 y.o. male who is a walk-in today to discuss his appts and meds.   Allergies: No Known Allergies  Vitals:    Past Medical History: Past Medical History:  Diagnosis Date  . Diabetes ([place])   . HIV (human immunodeficiency virus infection) ([place])   . TB (pulmonary tuberculosis)     Social History: Social History   Socioeconomic History  . Marital status: Single    Spouse name: Not on file  . Number of children: 6  . Years of education: Not on file  . Highest education level: Not on file  Social Needs  . Financial resource strain: Not on file  . Food insecurity - worry: Not on file  . Food insecurity - inability: Not on file  . Transportation needs - medical: Not on file  . Transportation needs - non-medical: Not on file  Occupational History    Comment: temp agency  Tobacco Use  . Smoking status: Current Every Day Smoker    Packs/day: 0.60    Years: 30.00    Pack years: 18.00    Types: Cigarettes  . Smokeless tobacco: Never Used  Substance and Sexual Activity  . Alcohol use: No    Alcohol/week: 0.0 oz    Comment: former  . Drug use: No  . Sexual activity: Yes    Comment: given condoms  Other Topics Concern  . Not on file  Social History Narrative  . Not on file    Previous Regimen: Stribild - developed some resistance Prezista + Stribild  Current Regimen: Descovy/Prez/DTG  Labs: HIV 1 RNA Quant (copies/mL)  Date Value  02/06/2017 <20 DETECTED (A)  08/08/2016 <20  12/23/2015 <20   CD4 T Cell Abs (/uL)  Date Value  02/06/2017 650  08/08/2016 700  12/23/2015 540   Hep B S Ab (no units)  Date Value  05/09/2008 Negative   Hepatitis B Surface Ag (no units)  Date Value  01/07/2009 NEG   HCV Ab (no units)  Date Value  05/09/2008 Negative    CrCl: CrCl cannot be calculated (Patient's most recent lab result is older than the maximum 21 days allowed.).  Lipids:    Component Value Date/Time   CHOL 241 (H) 02/06/2017 1557    TRIG 198 (H) 02/06/2017 1557   HDL 51 02/06/2017 1557   CHOLHDL 4.7 02/06/2017 1557   VLDL 40 (H) 02/06/2017 1557   LDLCALC 150 (H) 02/06/2017 1557   HIV Genotype Composite Data Genotype Dates:   Mutations in Bold impact drug susceptibility RT Mutations L210W, T215D, Y181C  PI Mutations M46L  Integrase Mutations    Interpretation of Genotype Data per Stanford HIV Database Nucleoside RTIs  abacavir (ABC) Low-Level Resistance zidovudine (AZT)  Intermediate Resistance stavudine (D4T)  Intermediate Resistance didanosine (DDI)   Low-Level Resistance emtricitabine (FTC)  Susceptible lamivudine (3TC)  Susceptible tenofovir (TDF)  Low-Level Resistance   Non-Nucleoside RTIs  efavirenz (EFV)  Intermediate Resistance etravirine (ETR)  Intermediate Resistance nevirapine (NVP) High-Level Resistance rilpivirine (RPV) Intermediate Resistance   Protease Inhibitors  atazanavir/r (ATV/r)           Potential Low-Level Resistance darunavir/r (DRV/r)           Susceptible fosamprenavir/r (FPV/r)           Potential Low-Level Resistance indinavir/r (IDV/r) Potential Low-Level Resistance lopinavir/r (LPV/r)            Potential Low-Level Resistance saquinavir/r (SQV/r)  Potential Low-Level Resistance tipranavir/r (TPV/r)           Potential Low-Level Resistance   Integrase Inhibitors     Assessment: Eric Jefferson has been doing very well on his regimen until early Feb where he was having trouble getting his meds at [facility] for some reason. We had to straighten it out with them. He missed about 2 days of antiretrovirals and he was worry about it today. Therefore, he walk-in today to ask [place]. Advised him that it should be ok because we are going to repeat labs today. He has an appt coming up with Dr. [name] in 2 weeks. He has not missed any doses beside those two. Otherwise, he is doing well on therapy. He asked about the need for a family practice physician. [facility] clinic but they  don't take Medicaid. After talking to Dr. [name], we will probably send him to the [facility] clinic.   His last lipid panel did show an increase in LDL and triglicerides. We will start him on low dose Lipitor today. We will get a baseline CMP today along with all of his HIV labs. His CD4 has been in the norna, range since 2016.   Since he has medicaid, we will combine his Descovy/prezcobix>>symtuza. His regimen now would be Symtuza/Tivicay. Offered him that they can be filled at [facility] but he would like to stay at [facility].   Recommendations:  HIV labs today Change Descovy/Prezcobix>>Symtuza 1 daily Continue Tivicay 1 PO qday Start Lipitor 10mg  PO qday F/u with Dr. [name] in 2 wks Need to set up with internal medicine  [name], PharmD, BCPS, AAHIVP, CPP Clinical [facility] 01/15/2018, 9:59 AM

## 2018-01-16 LAB — LIPID PANEL
Cholesterol: 239 mg/dL — ABNORMAL HIGH (ref <200)
HDL: 35 mg/dL — ABNORMAL LOW (ref >40)
NON-HDL CHOLESTEROL (CALC): 204 mg/dL — AB (ref <130)
Total CHOL/HDL Ratio: 6.8 (calc) — ABNORMAL HIGH (ref <5.0)
Triglycerides: 423 mg/dL — ABNORMAL HIGH (ref <150)

## 2018-01-16 LAB — COMPLETE METABOLIC PANEL WITH GFR
AG RATIO: 1.1 (calc) (ref 1.0–2.5)
ALKALINE PHOSPHATASE (APISO): 113 U/L (ref 40–115)
ALT: 11 U/L (ref 9–46)
AST: 21 U/L (ref 10–35)
Albumin: 4.1 g/dL (ref 3.6–5.1)
BILIRUBIN TOTAL: 0.2 mg/dL (ref 0.2–1.2)
BUN/Creatinine Ratio: 27 (calc) — ABNORMAL HIGH (ref 6–22)
BUN: 30 mg/dL — ABNORMAL HIGH (ref 7–25)
CALCIUM: 9.4 mg/dL (ref 8.6–10.3)
CHLORIDE: 106 mmol/L (ref 98–110)
CO2: 25 mmol/L (ref 20–32)
Creat: 1.12 mg/dL (ref 0.70–1.33)
GFR, EST NON AFRICAN AMERICAN: 72 mL/min/{1.73_m2} (ref >=60)
GFR, Est African American: 83 mL/min/{1.73_m2} (ref >=60)
GLOBULIN: 3.7 g/dL (ref 1.9–3.7)
Glucose, Bld: 98 mg/dL (ref 65–99)
POTASSIUM: 4.7 mmol/L (ref 3.5–5.3)
SODIUM: 138 mmol/L (ref 135–146)
Total Protein: 7.8 g/dL (ref 6.1–8.1)

## 2018-01-16 LAB — RPR: RPR Ser Ql: NONREACTIVE

## 2018-01-18 LAB — HIV-1 RNA QUANT-NO REFLEX-BLD
HIV 1 RNA Quant: <20 copies/mL — AB
HIV-1 RNA QUANT, LOG: DETECTED {Log_copies}/mL — AB

## 2018-01-31 ENCOUNTER — Ambulatory Visit (INDEPENDENT_AMBULATORY_CARE_PROVIDER_SITE_OTHER): Payer: Medicaid Other | Admitting: Infectious Diseases

## 2018-01-31 ENCOUNTER — Encounter: Payer: Self-pay | Admitting: Infectious Diseases

## 2018-01-31 VITALS — BP 124/74 | HR 52 | Temp 98.0°F

## 2018-01-31 DIAGNOSIS — Z23 Encounter for immunization: Secondary | ICD-10-CM | POA: Diagnosis not present

## 2018-01-31 DIAGNOSIS — J302 Other seasonal allergic rhinitis: Secondary | ICD-10-CM

## 2018-01-31 DIAGNOSIS — A150 Tuberculosis of lung: Secondary | ICD-10-CM | POA: Diagnosis not present

## 2018-01-31 DIAGNOSIS — Z113 Encounter for screening for infections with a predominantly sexual mode of transmission: Secondary | ICD-10-CM

## 2018-01-31 DIAGNOSIS — Z79899 Other long term (current) drug therapy: Secondary | ICD-10-CM | POA: Diagnosis not present

## 2018-01-31 DIAGNOSIS — B392 Pulmonary histoplasmosis capsulati, unspecified: Secondary | ICD-10-CM

## 2018-01-31 DIAGNOSIS — B20 Human immunodeficiency virus [HIV] disease: Secondary | ICD-10-CM

## 2018-01-31 NOTE — Assessment & Plan Note (Signed)
Has completed tx Will watch for recurrence.

## 2018-01-31 NOTE — Assessment & Plan Note (Signed)
He is doing well on salvage rx Given condoms.  Will give him flu shot.  He will get PCP eval at [facility] Return to clinic in 6 months.

## 2018-01-31 NOTE — Assessment & Plan Note (Signed)
Suggested he try OTS such as claritin, zyrtec.

## 2018-01-31 NOTE — Progress Notes (Signed)
   Subjective:    Patient ID: Eric Jefferson, male    DOB: 01-21-1961, 57 y.o.   MRN: [id number]  HPI 57 yo [place] M with HIV/AIDS, previously treated TB, adm to [facility] 2-23 to 12-29-12 with worsening SOB and nodular lung disease. His BAL grew histo and he was started on itraconazole.  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 He has been changed to stribilid. He had persistently elevated HIV RNA and darunavir was added to his stribilid.  He had genoytpe 06-22-15 showing: Reverse Transcriptase Gene: Y181C, L210W, T215D  Protease Gene: A71V   He has been maintained on tivicay/dscovey/darunavir. He has been getting help from our pharmacist.  Today he is feeling well. No SOB or cough.  He has been eating well, wt is steady.    HIV 1 RNA Quant (copies/mL)  Date Value  01/15/2018 <20 DETECTED (A)  02/06/2017 <20 DETECTED (A)  08/08/2016 <20   CD4 T Cell Abs (/uL)  Date Value  02/06/2017 650  08/08/2016 700  12/23/2015 540    Review of Systems  Constitutional: Negative for appetite change, chills, fever and unexpected weight change.  HENT: Positive for rhinorrhea. Negative for sinus pain.   Respiratory: Positive for cough. Negative for choking.   Gastrointestinal: Negative for constipation and diarrhea.  Genitourinary: Negative for difficulty urinating.  Psychiatric/Behavioral: Positive for sleep disturbance.  believes he has been having seasonal allergies. Has taken no OTCs except tylenol.  Please see HPI. All other systems reviewed and negative.      Objective:   Physical Exam  Constitutional: He is oriented to person, place, and time. He appears well-developed and well-nourished.  HENT:  Mouth/Throat: No oropharyngeal exudate.  Eyes: Pupils are equal, round, and reactive to light. EOM are normal.  Neck: Neck supple.  Cardiovascular: Normal rate, regular rhythm and normal heart sounds.  Pulmonary/Chest: Effort  normal and breath sounds normal.  Abdominal: Soft. Bowel sounds are normal. There is no tenderness. There is no rebound.  Musculoskeletal: He exhibits no edema.  Lymphadenopathy:    He has no cervical adenopathy.  Neurological: He is alert and oriented to person, place, and time.  Psychiatric: He has a normal mood and affect.       Assessment & Plan:

## 2018-07-19 ENCOUNTER — Other Ambulatory Visit: Payer: Self-pay

## 2018-08-15 ENCOUNTER — Encounter: Payer: Self-pay | Admitting: Infectious Diseases

## 2018-09-01 ENCOUNTER — Other Ambulatory Visit: Payer: Self-pay | Admitting: Infectious Diseases

## 2018-09-03 ENCOUNTER — Other Ambulatory Visit: Payer: Self-pay

## 2018-09-03 DIAGNOSIS — B20 Human immunodeficiency virus [HIV] disease: Secondary | ICD-10-CM

## 2018-09-03 MED ORDER — DARUN-COBIC-EMTRICIT-TENOFAF 800-150-200-10 MG PO TABS: 1.0000 | ORAL_TABLET | Freq: Every day | ORAL | 0 refills | Status: DC

## 2018-09-03 MED ORDER — DOLUTEGRAVIR SODIUM 50 MG PO TABS: ORAL_TABLET | ORAL | 0 refills | Status: DC

## 2018-09-04 ENCOUNTER — Other Ambulatory Visit: Payer: Self-pay | Admitting: Infectious Diseases

## 2018-09-04 ENCOUNTER — Other Ambulatory Visit: Payer: Medicaid Other

## 2018-09-04 DIAGNOSIS — Z113 Encounter for screening for infections with a predominantly sexual mode of transmission: Secondary | ICD-10-CM

## 2018-09-04 DIAGNOSIS — B20 Human immunodeficiency virus [HIV] disease: Secondary | ICD-10-CM

## 2018-09-05 ENCOUNTER — Encounter: Payer: Self-pay | Admitting: Infectious Diseases

## 2018-09-06 LAB — T-HELPER CELL (CD4) - (RCID CLINIC ONLY)
CD4 T CELL HELPER: 25 % — AB (ref 33–55)
CD4 T Cell Abs: 740 /uL (ref 400–2700)

## 2018-09-07 ENCOUNTER — Telehealth: Payer: Self-pay

## 2018-09-07 NOTE — Telephone Encounter (Signed)
PA for Atrovastatin 10 mg tab denied. Patient only has family planning and will need to reach out to case manager for additional coverage options. Spoke with Juliann Pulse, Museum/gallery curator Counselor who states patient is in the process for ADAP waiting on approval. Betthy, Pharmacy Tech has helped patient with patient assistance.  PA ID: [id number] Aundria Rud, CMA

## 2018-09-10 LAB — COMPREHENSIVE METABOLIC PANEL
AG Ratio: 1.6 (calc) (ref 1.0–2.5)
ALKALINE PHOSPHATASE (APISO): 79 U/L (ref 40–115)
ALT: 11 U/L (ref 9–46)
AST: 21 U/L (ref 10–35)
Albumin: 4.5 g/dL (ref 3.6–5.1)
BILIRUBIN TOTAL: 0.4 mg/dL (ref 0.2–1.2)
BUN: 15 mg/dL (ref 7–25)
CHLORIDE: 104 mmol/L (ref 98–110)
CO2: 30 mmol/L (ref 20–32)
CREATININE: 1.2 mg/dL (ref 0.70–1.33)
Calcium: 9.1 mg/dL (ref 8.6–10.3)
GLOBULIN: 2.8 g/dL (ref 1.9–3.7)
Glucose, Bld: 128 mg/dL — ABNORMAL HIGH (ref 65–99)
Potassium: 4.4 mmol/L (ref 3.5–5.3)
Sodium: 140 mmol/L (ref 135–146)
Total Protein: 7.3 g/dL (ref 6.1–8.1)

## 2018-09-10 LAB — CBC
HEMATOCRIT: 40.8 % (ref 38.5–50.0)
HEMOGLOBIN: 13.4 g/dL (ref 13.2–17.1)
MCH: 29.1 pg (ref 27.0–33.0)
MCHC: 32.8 g/dL (ref 32.0–36.0)
MCV: 88.5 fL (ref 80.0–100.0)
MPV: 10.3 fL (ref 7.5–12.5)
Platelets: 271 10*3/uL (ref 140–400)
RBC: 4.61 10*6/uL (ref 4.20–5.80)
RDW: 13.3 % (ref 11.0–15.0)
WBC: 8.2 10*3/uL (ref 3.8–10.8)

## 2018-09-10 LAB — HIV-1 RNA QUANT-NO REFLEX-BLD
HIV 1 RNA Quant: <20 copies/mL
HIV-1 RNA Quant, Log: <1.3 Log copies/mL

## 2018-09-10 LAB — RPR: RPR: NONREACTIVE

## 2018-09-18 ENCOUNTER — Encounter: Payer: Self-pay | Admitting: Infectious Diseases

## 2018-09-18 ENCOUNTER — Ambulatory Visit (INDEPENDENT_AMBULATORY_CARE_PROVIDER_SITE_OTHER): Payer: Self-pay | Admitting: Infectious Diseases

## 2018-09-18 VITALS — BP 123/73 | HR 75 | Temp 98.2°F | Wt 110.0 lb

## 2018-09-18 DIAGNOSIS — F17200 Nicotine dependence, unspecified, uncomplicated: Secondary | ICD-10-CM

## 2018-09-18 DIAGNOSIS — Z113 Encounter for screening for infections with a predominantly sexual mode of transmission: Secondary | ICD-10-CM

## 2018-09-18 DIAGNOSIS — B20 Human immunodeficiency virus [HIV] disease: Secondary | ICD-10-CM

## 2018-09-18 DIAGNOSIS — Z79899 Other long term (current) drug therapy: Secondary | ICD-10-CM

## 2018-09-18 DIAGNOSIS — R739 Hyperglycemia, unspecified: Secondary | ICD-10-CM | POA: Insufficient documentation

## 2018-09-18 DIAGNOSIS — Z23 Encounter for immunization: Secondary | ICD-10-CM

## 2018-09-18 NOTE — Addendum Note (Signed)
Addended by: Aundria Rud on: 09/18/2018 03:50 PM   Modules accepted: Orders

## 2018-09-18 NOTE — Assessment & Plan Note (Signed)
Encouraged to quit smoking.  

## 2018-09-18 NOTE — Progress Notes (Signed)
   Subjective:    Patient ID: Eric Jefferson, male    DOB: 1961/04/16, 57 y.o.   MRN: [id number]  HPI 57yo [place] M with HIV/AIDS, previously treated TB, adm to [facility] 2-23 to 12-29-12 with worsening SOB and nodular lung disease. His BAL grew histo and he was started on itraconazole.  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 He has been changed to stribilid. He had persistently elevated HIV RNA and darunavir was added to his stribilid.  He had genoytpe 06-22-15 showing: Reverse Transcriptase Gene: Y181C, L210W, T215D  Protease Gene: A71V   He has since been maintained on tivicay/symtuza. He is seen with interpreter.  He is feeling well today. No problems with medications.  Eating well, wt has been steady (he feels like he has gained wt).    HIV 1 RNA Quant (copies/mL)  Date Value  09/04/2018 <20 NOT DETECTED  01/15/2018 <20 DETECTED (A)  02/06/2017 <20 DETECTED (A)   CD4 T Cell Abs (/uL)  Date Value  09/04/2018 740  02/06/2017 650  08/08/2016 700    Review of Systems  Constitutional: Negative for appetite change and unexpected weight change.  HENT: Negative for mouth sores.   Respiratory: Positive for cough. Negative for shortness of breath.   Gastrointestinal: Negative for diarrhea and nausea.  Genitourinary: Negative for difficulty urinating.  Please see HPI. All other systems reviewed and negative.     Objective:   Physical Exam  Constitutional: He is oriented to person, place, and time. He appears well-developed and well-nourished. No distress.  HENT:  Mouth/Throat: No oropharyngeal exudate.  Eyes: Pupils are equal, round, and reactive to light. EOM are normal.  Neck: Normal range of motion. Neck supple.  Cardiovascular: Normal rate, regular rhythm and normal heart sounds.  Pulmonary/Chest: Effort normal and breath sounds normal.  Abdominal: Soft. Bowel sounds are normal. He exhibits no distension. There is  no tenderness.  Musculoskeletal: He exhibits no edema.  Lymphadenopathy:    He has no cervical adenopathy.  Neurological: He is alert and oriented to person, place, and time.  Psychiatric: He has a normal mood and affect.          Assessment & Plan:

## 2018-09-18 NOTE — Assessment & Plan Note (Signed)
Last Glc was 120 Will watch, check A1C with next visit.

## 2018-09-18 NOTE — Assessment & Plan Note (Signed)
He is doing well  His CD4 is discussed using interpreter.  Flu and PCV today.  Offered/given condoms. rtc in 9 months

## 2018-10-03 ENCOUNTER — Other Ambulatory Visit: Payer: Self-pay | Admitting: Pharmacist

## 2018-10-03 DIAGNOSIS — B20 Human immunodeficiency virus [HIV] disease: Secondary | ICD-10-CM

## 2018-10-03 MED ORDER — DOLUTEGRAVIR SODIUM 50 MG PO TABS: 50.0000 mg | ORAL_TABLET | Freq: Every day | ORAL | 2 refills | Status: DC

## 2018-12-26 ENCOUNTER — Other Ambulatory Visit: Payer: Self-pay | Admitting: Behavioral Health

## 2018-12-26 ENCOUNTER — Other Ambulatory Visit: Payer: Self-pay | Admitting: Infectious Diseases

## 2018-12-26 DIAGNOSIS — B20 Human immunodeficiency virus [HIV] disease: Secondary | ICD-10-CM

## 2018-12-26 MED ORDER — DOLUTEGRAVIR SODIUM 50 MG PO TABS: 50.0000 mg | ORAL_TABLET | Freq: Every day | ORAL | 5 refills | Status: DC

## 2018-12-26 MED ORDER — DARUN-COBIC-EMTRICIT-TENOFAF 800-150-200-10 MG PO TABS: 1.0000 | ORAL_TABLET | Freq: Every day | ORAL | 5 refills | Status: DC

## 2019-02-15 ENCOUNTER — Other Ambulatory Visit: Payer: Self-pay | Admitting: Infectious Diseases

## 2019-02-15 DIAGNOSIS — B20 Human immunodeficiency virus [HIV] disease: Secondary | ICD-10-CM

## 2019-02-15 MED ORDER — DOLUTEGRAVIR SODIUM 50 MG PO TABS: 50.0000 mg | ORAL_TABLET | Freq: Every day | ORAL | 5 refills | Status: DC

## 2019-06-11 ENCOUNTER — Other Ambulatory Visit: Payer: Self-pay

## 2019-06-11 DIAGNOSIS — Z113 Encounter for screening for infections with a predominantly sexual mode of transmission: Secondary | ICD-10-CM

## 2019-06-11 DIAGNOSIS — R739 Hyperglycemia, unspecified: Secondary | ICD-10-CM

## 2019-06-11 DIAGNOSIS — B20 Human immunodeficiency virus [HIV] disease: Secondary | ICD-10-CM

## 2019-06-11 DIAGNOSIS — Z79899 Other long term (current) drug therapy: Secondary | ICD-10-CM

## 2019-06-12 LAB — T-HELPER CELL (CD4) - (RCID CLINIC ONLY)
CD4 % Helper T Cell: 22 % — ABNORMAL LOW (ref 33–65)
CD4 T Cell Abs: 691 /uL (ref 400–1790)

## 2019-06-13 LAB — URINE CYTOLOGY ANCILLARY ONLY
Chlamydia: NEGATIVE
Neisseria Gonorrhea: NEGATIVE

## 2019-06-14 LAB — COMPREHENSIVE METABOLIC PANEL
AG Ratio: 1.6 (calc) (ref 1.0–2.5)
ALT: 17 U/L (ref 9–46)
AST: 25 U/L (ref 10–35)
Albumin: 4.5 g/dL (ref 3.6–5.1)
Alkaline phosphatase (APISO): 78 U/L (ref 35–144)
BUN/Creatinine Ratio: 14 (calc) (ref 6–22)
BUN: 20 mg/dL (ref 7–25)
CO2: 26 mmol/L (ref 20–32)
Calcium: 9.3 mg/dL (ref 8.6–10.3)
Chloride: 104 mmol/L (ref 98–110)
Creat: 1.38 mg/dL — ABNORMAL HIGH (ref 0.70–1.25)
Globulin: 2.9 g/dL (calc) (ref 1.9–3.7)
Glucose, Bld: 81 mg/dL (ref 65–99)
Potassium: 4.3 mmol/L (ref 3.5–5.3)
Sodium: 139 mmol/L (ref 135–146)
Total Bilirubin: 0.4 mg/dL (ref 0.2–1.2)
Total Protein: 7.4 g/dL (ref 6.1–8.1)

## 2019-06-14 LAB — HIV-1 RNA QUANT-NO REFLEX-BLD
HIV 1 RNA Quant: <20 copies/mL — AB
HIV-1 RNA Quant, Log: <1.3 Log copies/mL — AB

## 2019-06-14 LAB — CBC
HCT: 41.6 % (ref 38.5–50.0)
Hemoglobin: 13.8 g/dL (ref 13.2–17.1)
MCH: 29.3 pg (ref 27.0–33.0)
MCHC: 33.2 g/dL (ref 32.0–36.0)
MCV: 88.3 fL (ref 80.0–100.0)
MPV: 10.5 fL (ref 7.5–12.5)
Platelets: 275 10*3/uL (ref 140–400)
RBC: 4.71 10*6/uL (ref 4.20–5.80)
RDW: 12.5 % (ref 11.0–15.0)
WBC: 9.7 10*3/uL (ref 3.8–10.8)

## 2019-06-14 LAB — RPR: RPR Ser Ql: NONREACTIVE

## 2019-06-14 LAB — LIPID PANEL
Cholesterol: 249 mg/dL — ABNORMAL HIGH (ref <200)
HDL: 53 mg/dL (ref >=40)
LDL Cholesterol (Calc): 156 mg/dL (calc) — ABNORMAL HIGH
Non-HDL Cholesterol (Calc): 196 mg/dL (calc) — ABNORMAL HIGH (ref <130)
Total CHOL/HDL Ratio: 4.7 (calc) (ref <5.0)
Triglycerides: 234 mg/dL — ABNORMAL HIGH (ref <150)

## 2019-06-14 LAB — HEMOGLOBIN A1C
Hgb A1c MFr Bld: 5.3 % of total Hgb (ref <5.7)
Mean Plasma Glucose: 105 (calc)
eAG (mmol/L): 5.8 (calc)

## 2019-06-25 ENCOUNTER — Encounter: Payer: Self-pay | Admitting: Infectious Diseases

## 2019-06-27 ENCOUNTER — Ambulatory Visit: Payer: Medicaid Other | Admitting: Infectious Diseases

## 2019-07-04 ENCOUNTER — Encounter: Payer: Self-pay | Admitting: Infectious Diseases

## 2019-07-11 ENCOUNTER — Ambulatory Visit (INDEPENDENT_AMBULATORY_CARE_PROVIDER_SITE_OTHER): Payer: Self-pay | Admitting: Infectious Diseases

## 2019-07-11 ENCOUNTER — Encounter: Payer: Self-pay | Admitting: Infectious Diseases

## 2019-07-11 ENCOUNTER — Other Ambulatory Visit: Payer: Self-pay

## 2019-07-11 VITALS — BP 136/84 | HR 88

## 2019-07-11 DIAGNOSIS — B20 Human immunodeficiency virus [HIV] disease: Secondary | ICD-10-CM

## 2019-07-11 DIAGNOSIS — K029 Dental caries, unspecified: Secondary | ICD-10-CM

## 2019-07-11 DIAGNOSIS — Z113 Encounter for screening for infections with a predominantly sexual mode of transmission: Secondary | ICD-10-CM

## 2019-07-11 DIAGNOSIS — Z23 Encounter for immunization: Secondary | ICD-10-CM

## 2019-07-11 DIAGNOSIS — Z79899 Other long term (current) drug therapy: Secondary | ICD-10-CM

## 2019-07-11 NOTE — Assessment & Plan Note (Signed)
Wants dental apt Will refer

## 2019-07-11 NOTE — Assessment & Plan Note (Signed)
He appears to be doing well Need to recheck his Cr Flu vax today.  PCV 20204 Refer for colon U/s for AAA screening.  rtc in 9 months provided Cr ok.

## 2019-07-11 NOTE — Progress Notes (Signed)
   Subjective:    Patient ID: Eric Jefferson, male    DOB: 07-31-1961, 58 y.o.   MRN: [id number]  HPI 58yo [place] M with HIV/AIDS, previously treated TB, adm to [facility] 2-23 to 12-29-12 with worsening SOB and nodular lung disease. His BAL grew histo and he was started on itraconazole.  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 He has been changed to stribilid. He had persistently elevated HIV RNA anddarunavirwas added to his stribilid.  He had genoytpe 06-22-15 showing: Reverse Transcriptase Gene: Y181C, L210W, T215D  Protease Gene: A71V  He has since been maintained on tivicay/symtuza. He is seen with interpreter.  Today states that he is feeling well. Takes his rx everyday.  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   He has had gradually increasing Cr.    HIV 1 RNA Quant (copies/mL)  Date Value  06/11/2019 <20 DETECTED (A)  09/04/2018 <20 NOT DETECTED  01/15/2018 <20 DETECTED (A)   CD4 T Cell Abs (/uL)  Date Value  06/11/2019 691  09/04/2018 740  02/06/2017 650    Review of Systems  Constitutional: Negative for appetite change, chills, fever and unexpected weight change.  Respiratory: Positive for cough. Negative for shortness of breath.   Gastrointestinal: Negative for diarrhea and nausea.  Genitourinary: Negative for difficulty urinating.  Please see HPI. All other systems reviewed and negative.      Objective:   Physical Exam Vitals signs reviewed.  Constitutional:      Appearance: Normal appearance.  HENT:     Mouth/Throat:     Mouth: Mucous membranes are moist.     Pharynx: No oropharyngeal exudate.  Eyes:     Extraocular Movements: Extraocular movements intact.     Pupils: Pupils are equal, round, and reactive to light.  Neck:     Musculoskeletal: Normal range of motion and neck supple.  Cardiovascular:     Rate and Rhythm: Normal rate and regular rhythm.  Pulmonary:     Effort:  Pulmonary effort is normal.     Breath sounds: Normal breath sounds.  Abdominal:     General: Abdomen is flat. Bowel sounds are normal. There is no distension.     Palpations: Abdomen is soft.     Tenderness: There is no abdominal tenderness.  Musculoskeletal:     Right lower leg: No edema.     Left lower leg: No edema.  Neurological:     General: No focal deficit present.     Mental Status: He is alert.  Psychiatric:        Mood and Affect: Mood normal.           Assessment & Plan:

## 2019-07-12 ENCOUNTER — Encounter (HOSPITAL_COMMUNITY): Payer: Self-pay | Admitting: Pharmacist Clinician (PhC)/ Clinical Pharmacy Specialist

## 2019-07-12 LAB — BASIC METABOLIC PANEL
BUN/Creatinine Ratio: 18 (calc) (ref 6–22)
BUN: 23 mg/dL (ref 7–25)
CO2: 26 mmol/L (ref 20–32)
Calcium: 9.6 mg/dL (ref 8.6–10.3)
Chloride: 103 mmol/L (ref 98–110)
Creat: 1.27 mg/dL — ABNORMAL HIGH (ref 0.70–1.25)
Glucose, Bld: 89 mg/dL (ref 65–99)
Potassium: 4.9 mmol/L (ref 3.5–5.3)
Sodium: 138 mmol/L (ref 135–146)

## 2019-07-15 ENCOUNTER — Other Ambulatory Visit: Payer: Self-pay | Admitting: Infectious Diseases

## 2019-07-15 DIAGNOSIS — B20 Human immunodeficiency virus [HIV] disease: Secondary | ICD-10-CM

## 2019-07-25 ENCOUNTER — Other Ambulatory Visit: Payer: Self-pay | Admitting: Infectious Diseases

## 2019-07-25 ENCOUNTER — Other Ambulatory Visit: Payer: Self-pay

## 2019-07-25 ENCOUNTER — Ambulatory Visit (HOSPITAL_COMMUNITY)
Admission: RE | Admit: 2019-07-25 | Discharge: 2019-07-25 | Disposition: A | Discharge status: Home / Self Care | Payer: Self-pay | Source: Ambulatory Visit | Attending: Infectious Diseases | Admitting: Infectious Diseases

## 2019-07-25 DIAGNOSIS — B20 Human immunodeficiency virus [HIV] disease: Secondary | ICD-10-CM | POA: Insufficient documentation

## 2019-09-10 ENCOUNTER — Other Ambulatory Visit: Payer: Self-pay | Admitting: Infectious Diseases

## 2019-09-10 DIAGNOSIS — B20 Human immunodeficiency virus [HIV] disease: Secondary | ICD-10-CM

## 2019-09-12 ENCOUNTER — Encounter: Payer: Self-pay | Admitting: Infectious Diseases

## 2019-11-25 ENCOUNTER — Telehealth: Payer: Self-pay

## 2019-11-25 NOTE — Telephone Encounter (Signed)
Received a call today from [facility] regarding medication. Pharmacy states patient was told he is supposed to be on one pill a day. Has only been filling Tivicay since June. Will forward message to Md to advise if filling symtuza is okay.  Will call pharmacy with update.  [place], [place]

## 2019-11-25 NOTE — Telephone Encounter (Signed)
Yes please thanks.

## 2019-11-25 NOTE — Telephone Encounter (Signed)
Called pharmacy regarding prescriptions. Pharmacy will go ahead and fill both medications. [place]

## 2019-11-25 NOTE — Telephone Encounter (Signed)
Please continue tivicay symtuza, thanks

## 2019-12-23 ENCOUNTER — Ambulatory Visit (INDEPENDENT_AMBULATORY_CARE_PROVIDER_SITE_OTHER): Payer: Self-pay | Admitting: Infectious Diseases

## 2019-12-23 ENCOUNTER — Other Ambulatory Visit: Payer: Self-pay

## 2019-12-23 ENCOUNTER — Encounter: Payer: Self-pay | Admitting: Infectious Diseases

## 2019-12-23 VITALS — BP 136/87 | HR 72 | Wt 119.0 lb

## 2019-12-23 DIAGNOSIS — Z113 Encounter for screening for infections with a predominantly sexual mode of transmission: Secondary | ICD-10-CM

## 2019-12-23 DIAGNOSIS — Z79899 Other long term (current) drug therapy: Secondary | ICD-10-CM

## 2019-12-23 DIAGNOSIS — G4700 Insomnia, unspecified: Secondary | ICD-10-CM

## 2019-12-23 DIAGNOSIS — B20 Human immunodeficiency virus [HIV] disease: Secondary | ICD-10-CM

## 2019-12-23 DIAGNOSIS — N183 Chronic kidney disease, stage 3 unspecified: Secondary | ICD-10-CM | POA: Insufficient documentation

## 2019-12-23 DIAGNOSIS — S42302A Unspecified fracture of shaft of humerus, left arm, initial encounter for closed fracture: Secondary | ICD-10-CM | POA: Insufficient documentation

## 2019-12-23 DIAGNOSIS — S42302S Unspecified fracture of shaft of humerus, left arm, sequela: Secondary | ICD-10-CM

## 2019-12-23 DIAGNOSIS — N1831 Chronic kidney disease, stage 3a: Secondary | ICD-10-CM

## 2019-12-23 MED ORDER — TRAZODONE HCL 50 MG PO TABS: 25.0000 mg | ORAL_TABLET | Freq: Every evening | ORAL | 1 refills | Status: DC | PRN

## 2019-12-23 NOTE — Progress Notes (Signed)
   Subjective:    Patient ID: Georgeanna Harrison, male    DOB: 1960-11-07, 59 y.o.   MRN: [id number]  HPI 59yo [place] M with HIV/AIDS, previously treated TB, adm to [facility] 2-23 to 12-29-12 with worsening SOB and nodular lung disease. His BAL grew histo and he was treated with itraconazole.   He had genoytpe 06-22-15 showing: Reverse Transcriptase Gene: Y181C, L210W, T215D  Protease Gene: A71V  Currently on tivicay-symtuza.  Has been feeling well.  Still working Teacher, music. Feels safe.  Has gained 9#.    HIV 1 RNA Quant (copies/mL)  Date Value  06/11/2019 <20 DETECTED (A)  09/04/2018 <20 NOT DETECTED  01/15/2018 <20 DETECTED (A)   CD4 T Cell Abs (/uL)  Date Value  06/11/2019 691  09/04/2018 740  02/06/2017 650      Review of Systems  Constitutional: Negative for appetite change, chills, fever and unexpected weight change.  Respiratory: Negative for cough and shortness of breath.   Gastrointestinal: Negative for constipation and diarrhea.  Genitourinary: Negative for difficulty urinating, dysuria and hematuria.  Psychiatric/Behavioral: Positive for sleep disturbance.       Objective:   Physical Exam Constitutional:      Appearance: Normal appearance.  HENT:     Mouth/Throat:     Mouth: Mucous membranes are moist.     Pharynx: No oropharyngeal exudate.  Eyes:     Extraocular Movements: Extraocular movements intact.     Pupils: Pupils are equal, round, and reactive to light.  Cardiovascular:     Rate and Rhythm: Normal rate and regular rhythm.  Pulmonary:     Effort: Pulmonary effort is normal.     Breath sounds: Normal breath sounds.  Abdominal:     General: Bowel sounds are normal. There is no distension.     Palpations: Abdomen is soft.     Tenderness: There is no abdominal tenderness.  Musculoskeletal:     Cervical back: Normal range of motion and neck supple.     Right lower leg: No edema.     Left lower leg: No edema.  Neurological:     General: No focal  deficit present.     Mental Status: He is alert.  Psychiatric:        Mood and Affect: Mood normal.           Assessment & Plan:
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
## 2019-12-23 NOTE — Assessment & Plan Note (Signed)
Will recheck Cr and check UA.

## 2019-12-23 NOTE — Addendum Note (Signed)
Addended by: Dolan Amen D on: 12/23/2019 09:31 AM   Modules accepted: Orders

## 2019-12-23 NOTE — Assessment & Plan Note (Signed)
Will send in rx for trazodone

## 2019-12-23 NOTE — Addendum Note (Signed)
Addended by: Donnita Farina C on: 12/23/2019 09:42 AM   Modules accepted: Orders

## 2019-12-23 NOTE — Assessment & Plan Note (Signed)
He is doing well Seen with interpreter and appears to be taking meds.  Check labs today give condoms Set up for colon rtc in 9 months.

## 2019-12-24 LAB — URINALYSIS, ROUTINE W REFLEX MICROSCOPIC
Bacteria, UA: NONE SEEN /HPF
Bilirubin Urine: NEGATIVE
Glucose, UA: NEGATIVE
Hgb urine dipstick: NEGATIVE
Hyaline Cast: NONE SEEN /LPF
Nitrite: NEGATIVE
Protein, ur: NEGATIVE
RBC / HPF: NONE SEEN /HPF (ref 0–2)
Specific Gravity, Urine: 1.025 (ref 1.001–1.03)
Squamous Epithelial / HPF: NONE SEEN /HPF (ref <=5)
WBC, UA: NONE SEEN /HPF (ref 0–5)
pH: <=5 (ref 5.0–8.0)

## 2019-12-24 LAB — T-HELPER CELL (CD4) - (RCID CLINIC ONLY)
CD4 % Helper T Cell: 20 % — ABNORMAL LOW (ref 33–65)
CD4 T Cell Abs: 485 /uL (ref 400–1790)

## 2019-12-26 LAB — COMPREHENSIVE METABOLIC PANEL
AG Ratio: 1.5 (calc) (ref 1.0–2.5)
ALT: 11 U/L (ref 9–46)
AST: 18 U/L (ref 10–35)
Albumin: 4.3 g/dL (ref 3.6–5.1)
Alkaline phosphatase (APISO): 78 U/L (ref 35–144)
BUN/Creatinine Ratio: 15 (calc) (ref 6–22)
BUN: 19 mg/dL (ref 7–25)
CO2: 26 mmol/L (ref 20–32)
Calcium: 9.1 mg/dL (ref 8.6–10.3)
Chloride: 104 mmol/L (ref 98–110)
Creat: 1.31 mg/dL — ABNORMAL HIGH (ref 0.70–1.25)
Globulin: 2.8 g/dL (calc) (ref 1.9–3.7)
Glucose, Bld: 121 mg/dL — ABNORMAL HIGH (ref 65–99)
Potassium: 4.1 mmol/L (ref 3.5–5.3)
Sodium: 138 mmol/L (ref 135–146)
Total Bilirubin: 0.3 mg/dL (ref 0.2–1.2)
Total Protein: 7.1 g/dL (ref 6.1–8.1)

## 2019-12-26 LAB — CBC
HCT: 44.8 % (ref 38.5–50.0)
Hemoglobin: 14.5 g/dL (ref 13.2–17.1)
MCH: 27.1 pg (ref 27.0–33.0)
MCHC: 32.4 g/dL (ref 32.0–36.0)
MCV: 83.6 fL (ref 80.0–100.0)
MPV: 10.3 fL (ref 7.5–12.5)
Platelets: 283 10*3/uL (ref 140–400)
RBC: 5.36 10*6/uL (ref 4.20–5.80)
RDW: 12.9 % (ref 11.0–15.0)
WBC: 9 10*3/uL (ref 3.8–10.8)

## 2019-12-26 LAB — HIV-1 RNA QUANT-NO REFLEX-BLD
HIV 1 RNA Quant: <20 copies/mL — AB
HIV-1 RNA Quant, Log: <1.3 Log copies/mL — AB

## 2019-12-26 LAB — RPR: RPR Ser Ql: NONREACTIVE

## 2019-12-31 ENCOUNTER — Other Ambulatory Visit: Payer: Self-pay

## 2019-12-31 ENCOUNTER — Ambulatory Visit (INDEPENDENT_AMBULATORY_CARE_PROVIDER_SITE_OTHER): Payer: Self-pay

## 2019-12-31 ENCOUNTER — Ambulatory Visit (INDEPENDENT_AMBULATORY_CARE_PROVIDER_SITE_OTHER): Payer: Medicaid Other | Admitting: Orthopaedic Surgery

## 2019-12-31 ENCOUNTER — Encounter: Payer: Self-pay | Admitting: Orthopaedic Surgery

## 2019-12-31 DIAGNOSIS — M25522 Pain in left elbow: Secondary | ICD-10-CM
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
## 2020-01-09 ENCOUNTER — Other Ambulatory Visit: Payer: Self-pay | Admitting: Orthopaedic Surgery

## 2020-01-09 ENCOUNTER — Encounter: Payer: Self-pay | Admitting: Infectious Diseases

## 2020-01-09 DIAGNOSIS — M25522 Pain in left elbow: Secondary | ICD-10-CM

## 2020-01-13 ENCOUNTER — Encounter: Payer: Self-pay | Admitting: Gastroenterology

## 2020-02-03 ENCOUNTER — Inpatient Hospital Stay: Admission: RE | Admit: 2020-02-03 | Payer: Medicaid Other | Source: Ambulatory Visit

## 2020-02-06 ENCOUNTER — Other Ambulatory Visit: Payer: Self-pay

## 2020-02-06 ENCOUNTER — Ambulatory Visit: Payer: Self-pay | Admitting: Orthopaedic Surgery

## 2020-02-12 ENCOUNTER — Encounter: Payer: Self-pay | Admitting: Gastroenterology

## 2020-02-14 ENCOUNTER — Ambulatory Visit
Admission: RE | Admit: 2020-02-14 | Discharge: 2020-02-14 | Disposition: A | Discharge status: Home / Self Care | Payer: Medicaid Other | Source: Ambulatory Visit | Attending: Orthopaedic Surgery | Admitting: Orthopaedic Surgery

## 2020-02-14 DIAGNOSIS — M25522 Pain in left elbow: Secondary | ICD-10-CM

## 2020-02-18 ENCOUNTER — Encounter: Payer: Medicaid Other | Admitting: Gastroenterology

## 2020-02-18 ENCOUNTER — Other Ambulatory Visit: Payer: Self-pay

## 2020-02-18 ENCOUNTER — Ambulatory Visit (INDEPENDENT_AMBULATORY_CARE_PROVIDER_SITE_OTHER): Payer: Self-pay | Admitting: Orthopaedic Surgery

## 2020-02-18 ENCOUNTER — Encounter: Payer: Self-pay | Admitting: Orthopaedic Surgery

## 2020-02-18 DIAGNOSIS — M25522 Pain in left elbow: Secondary | ICD-10-CM
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
## 2020-02-26 ENCOUNTER — Ambulatory Visit (AMBULATORY_SURGERY_CENTER): Payer: Self-pay | Admitting: *Deleted

## 2020-02-26 ENCOUNTER — Other Ambulatory Visit: Payer: Self-pay

## 2020-02-26 VITALS — Temp 97.3°F | Ht 63.5 in | Wt 119.6 lb

## 2020-02-26 DIAGNOSIS — Z1211 Encounter for screening for malignant neoplasm of colon: Secondary | ICD-10-CM

## 2020-02-26 MED ORDER — SUPREP BOWEL PREP KIT 17.5-3.13-1.6 GM/177ML PO SOLN: ORAL | 0 refills | Status: DC

## 2020-02-26 NOTE — Progress Notes (Signed)
Interpreter used today at the [facility] for this pt.  Interpreter's name is- Ykeo Eban temp 97.5  Pt received both doses of covid vaccine- 2nd dose 02-14-20  Pt states soy causes upset stomach, no trouble eating eggs   No egg allergy  No home oxygen use   No medications for weight loss taken  Pt denies constipation issues  Pt denies difficulty moving neck  Pt is aware that care partner will wait in the car during procedure; if they feel like they will be too hot or cold to wait in the car; they may wait in the 4 th floor lobby. Patient is aware to bring only one care partner. We want them to wear a mask (we do not have any that we can provide them), practice social distancing, and we will check their temperatures when they get here.  I did remind the patient that their care partner needs to stay in the parking lot the entire time and have a cell phone available, we will call them when the pt is ready for discharge. Patient will wear mask into building.

## 2020-03-04 ENCOUNTER — Other Ambulatory Visit: Payer: Self-pay

## 2020-03-04 ENCOUNTER — Encounter: Payer: Self-pay | Admitting: Gastroenterology

## 2020-03-04 ENCOUNTER — Ambulatory Visit (AMBULATORY_SURGERY_CENTER): Payer: Self-pay | Admitting: Gastroenterology

## 2020-03-04 VITALS — BP 115/69 | HR 60 | Temp 96.2°F | Resp 19 | Ht 63.0 in | Wt 120.0 lb

## 2020-03-04 DIAGNOSIS — Z1211 Encounter for screening for malignant neoplasm of colon: Secondary | ICD-10-CM

## 2020-03-04 DIAGNOSIS — D124 Benign neoplasm of descending colon: Secondary | ICD-10-CM

## 2020-03-04 MED ORDER — SODIUM CHLORIDE 0.9 % IV SOLN: 500.0000 mL | Freq: Once | INTRAVENOUS | Status: DC

## 2020-03-04 NOTE — Progress Notes (Signed)
pt tolerated well. VSS. awake and to recovery. Report given to RN.  

## 2020-03-04 NOTE — Op Note (Signed)
[facility] Patient Name: Siddiq Maharrey Procedure Date: 03/04/2020 9:24 AM MRN: [id number] Endoscopist: Milus Banister , MD Age: 59 Referring MD:  Date of Birth: Mar 28, 1961 Gender: Male Account #: [id number] Procedure:                Colonoscopy Indications:              Screening for colorectal malignant neoplasm Medicines:                Monitored Anesthesia Care Procedure:                Pre-Anesthesia Assessment:                           - Prior to the procedure, a History and Physical                            was performed, and patient medications and                            allergies were reviewed. The patient's tolerance of                            previous anesthesia was also reviewed. The risks                            and benefits of the procedure and the sedation                            options and risks were discussed with the patient.                            All questions were answered, and informed consent                            was obtained. Prior Anticoagulants: The patient has                            taken no previous anticoagulant or antiplatelet                            agents. ASA Grade Assessment: III - A patient with                            severe systemic disease. After reviewing the risks                            and benefits, the patient was deemed in                            satisfactory condition to undergo the procedure.                           After obtaining informed consent, the colonoscope  was passed under direct vision. Throughout the                            procedure, the patient's blood pressure, pulse, and                            oxygen saturations were monitored continuously. The                            Colonoscope was introduced through the anus and                            advanced to the the cecum, identified by                            appendiceal orifice and  ileocecal valve. The                            colonoscopy was performed without difficulty. The                            patient tolerated the procedure well. The quality                            of the bowel preparation was good. The ileocecal                            valve, appendiceal orifice, and rectum were                            photographed. Scope In: 9:32:07 AM Scope Out: 9:40:45 AM Scope Withdrawal Time: 0 hours 6 minutes 6 seconds  Total Procedure Duration: 0 hours 8 minutes 38 seconds  Findings:                 A 4 mm polyp was found in the descending colon. The                            polyp was sessile. The polyp was removed with a                            cold snare. Resection and retrieval were complete.                           Internal hemorrhoids were found. The hemorrhoids                            were small.                           The exam was otherwise without abnormality on                            direct and retroflexion views. Complications:  No immediate complications. Estimated blood loss:                            None. Estimated Blood Loss:     Estimated blood loss: none. Impression:               - One 4 mm polyp in the descending colon, removed                            with a cold snare. Resected and retrieved.                           - Small internal hemorrhoids.                           - The examination was otherwise normal on direct                            and retroflexion views. Recommendation:           - Patient has a contact number available for                            emergencies. The signs and symptoms of potential                            delayed complications were discussed with the                            patient. Return to normal activities tomorrow.                            Written discharge instructions were provided to the                            patient.                           -  Resume previous diet.                           - Continue present medications.                           - Await pathology results. Milus Banister, MD 03/04/2020 9:45:13 AM This report has been signed electronically.

## 2020-03-04 NOTE — Patient Instructions (Signed)
Thank you for allowing [place] to care for you today!  Await biopsy  result of polyp removed, approximately 2 weeks.  Will make recommendation at that time for future colonoscopy.  Resume previous diet and medications today.  Return to your normal activities tomorrow.     YOU HAD AN ENDOSCOPIC PROCEDURE TODAY AT [facility]:   Refer to the procedure report that was given to you for any specific questions about what was found during the examination.  If the procedure report does not answer your questions, please call your gastroenterologist to clarify.  If you requested that your care partner not be given the details of your procedure findings, then the procedure report has been included in a sealed envelope for you to review at your convenience later.  YOU SHOULD EXPECT: Some feelings of bloating in the abdomen. Passage of more gas than usual.  Walking can help get rid of the air that was put into your GI tract during the procedure and reduce the bloating. If you had a lower endoscopy (such as a colonoscopy or flexible sigmoidoscopy) you may notice spotting of blood in your stool or on the toilet paper. If you underwent a bowel prep for your procedure, you may not have a normal bowel movement for a few days.  Please Note:  You might notice some irritation and congestion in your nose or some drainage.  This is from the oxygen used during your procedure.  There is no need for concern and it should clear up in a day or so.  SYMPTOMS TO REPORT IMMEDIATELY:   Following lower endoscopy (colonoscopy or flexible sigmoidoscopy):  Excessive amounts of blood in the stool  Significant tenderness or worsening of abdominal pains  Swelling of the abdomen that is new, acute  Fever of 100F or higher   For urgent or emergent issues, a gastroenterologist can be reached at any hour by calling [phone]. Do not use MyChart messaging for urgent concerns.    DIET:  We do recommend a small  meal at first, but then you may proceed to your regular diet.  Drink plenty of fluids but you should avoid alcoholic beverages for 24 hours.  ACTIVITY:  You should plan to take it easy for the rest of today and you should NOT DRIVE or use heavy machinery until tomorrow (because of the sedation medicines used during the test).    FOLLOW UP: Our staff will call the number listed on your records 48-72 hours following your procedure to check on you and address any questions or concerns that you may have regarding the information given to you following your procedure. If we do not reach you, we will leave a message.  We will attempt to reach you two times.  During this call, we will ask if you have developed any symptoms of COVID 19. If you develop any symptoms (ie: fever, flu-like symptoms, shortness of breath, cough etc.) before then, please call [phone].  If you test positive for Covid 19 in the 2 weeks post procedure, please call and report this information to [place].    If any biopsies were taken you will be contacted by phone or by letter within the next 1-3 weeks.  Please call us at [phone] if you have not heard about the biopsies in 3 weeks.    SIGNATURES/CONFIDENTIALITY: You and/or your care partner have signed paperwork which will be entered into your electronic medical record.  These signatures attest to the fact that  that the information above on your After Visit Summary has been reviewed and is understood.  Full responsibility of the confidentiality of this discharge information lies with you and/or your care-partner.

## 2020-03-04 NOTE — Progress Notes (Signed)
Pt's states no medical or surgical changes since previsit or office visit.  Temp- June Vitals- [place] 

## 2020-03-04 NOTE — Progress Notes (Signed)
Called to room to assist during endoscopic procedure.  Patient ID and intended procedure confirmed with present staff. Received instructions for my participation in the procedure from the performing physician.  

## 2020-03-06 ENCOUNTER — Telehealth: Payer: Self-pay

## 2020-03-06 NOTE — Telephone Encounter (Signed)
  Follow up Call-  Call back number 03/04/2020  Post procedure Call Back phone  # [phone]  Permission to leave phone message Yes  Some recent data might be hidden     Patient questions:  Do you have a fever, pain , or abdominal swelling? No. Pain Score  0 *  Have you tolerated food without any problems? Yes.    Have you been able to return to your normal activities? Yes.    Do you have any questions about your discharge instructions: Diet   No. Medications  No. Follow up visit  No.  Do you have questions or concerns about your Care? No.  Actions: * If pain score is 4 or above: No action needed, pain <4. 1. Have you developed a fever since your procedure? no  2.   Have you had an respiratory symptoms (SOB or cough) since your procedure? no  3.   Have you tested positive for COVID 19 since your procedure no  4.   Have you had any family members/close contacts diagnosed with the COVID 19 since your procedure?  no   If yes to any of these questions please route to Joylene John, RN and Erenest Rasher, RN

## 2020-03-09 ENCOUNTER — Encounter: Payer: Self-pay | Admitting: Gastroenterology

## 2020-03-23 ENCOUNTER — Telehealth: Payer: Self-pay | Admitting: *Deleted

## 2020-03-23 NOTE — Telephone Encounter (Signed)
Patient called for help filling his medication. Per [facility], last filled Tivicay (03/22) Symtuza (03/22). ADAP expired. Front Desk will reach out for financial counseling appointment, pharmacy to get patient back on medication. Landis Gandy, RN

## 2020-03-24 ENCOUNTER — Ambulatory Visit (INDEPENDENT_AMBULATORY_CARE_PROVIDER_SITE_OTHER): Payer: Self-pay | Admitting: Pharmacist

## 2020-03-24 ENCOUNTER — Ambulatory Visit: Payer: Self-pay

## 2020-03-24 ENCOUNTER — Other Ambulatory Visit: Payer: Self-pay

## 2020-03-24 ENCOUNTER — Other Ambulatory Visit: Payer: Self-pay | Admitting: Pharmacist

## 2020-03-24 ENCOUNTER — Telehealth: Payer: Self-pay | Admitting: Pharmacy Technician

## 2020-03-24 DIAGNOSIS — B20 Human immunodeficiency virus [HIV] disease: Secondary | ICD-10-CM

## 2020-03-24 DIAGNOSIS — Z9114 Patient's other noncompliance with medication regimen: Secondary | ICD-10-CM

## 2020-03-24 MED ORDER — TIVICAY 50 MG PO TABS: 50.0000 mg | ORAL_TABLET | Freq: Every day | ORAL | 5 refills | Status: DC

## 2020-03-24 MED ORDER — SYMTUZA 800-150-200-10 MG PO TABS: 1.0000 | ORAL_TABLET | Freq: Every day | ORAL | 5 refills | Status: DC

## 2020-03-24 NOTE — Telephone Encounter (Signed)
Thank you :)

## 2020-03-24 NOTE — Progress Notes (Signed)
HPI: Eric Jefferson is a 59 y.o. male who presents to the [place] clinic for HIV follow-up.  Patient Active Problem List   Diagnosis Date Noted  . Closed left arm fracture 12/23/2019  . CKD (chronic kidney disease) stage 3, GFR 30-59 ml/min 12/23/2019  . Insomnia 12/23/2019  . Hyperglycemia 09/18/2018  . Seasonal allergies 01/31/2018  . Anorexia 07/15/2015  . Abdominal pain 02/09/2015  . Otitis media 02/09/2015  . Loss of weight 12/24/2012  . 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 Dental caries 09/09/2009  . Human immunodeficiency virus (HIV) disease ([place]) 05/06/2009  . Pulmonary tuberculosis 05/09/2008  . TOBACCO ABUSE 02/04/2008  . GERD 02/04/2008    Patient's Medications  New Prescriptions   No medications on file  Previous Medications   ACETAMINOPHEN (TYLENOL PO)    Take by mouth as needed.   ATORVASTATIN (LIPITOR) 10 MG TABLET    TAKE 1 TABLET(10 MG) BY MOUTH DAILY   DARUNAVIR-COBICISCTAT-EMTRICITABINE-TENOFOVIR ALAFENAMIDE (SYMTUZA) 800-150-200-10 MG TABS    Take 1 tablet by mouth daily with breakfast. Take with Tivicay.   DOLUTEGRAVIR (TIVICAY) 50 MG TABLET    Take 1 tablet (50 mg total) by mouth daily. Take with Symtuza.   TRAZODONE (DESYREL) 50 MG TABLET    Take 0.5 tablets (25 mg total) by mouth at bedtime as needed for sleep.  Modified Medications   No medications on file  Discontinued Medications   No medications on file    Allergies: Allergies  Allergen Reactions  . Soy Allergy     Upset stomach only- no other issues    Past Medical History: Past Medical History:  Diagnosis Date  . Arthritis   . Diabetes ([place])    02-26-20 pt denies  . Headache   . HIV (human immunodeficiency virus infection) ([place])   . TB (pulmonary tuberculosis)    dx in 2009- took years worth of medication for this    Social History: Social History   Socioeconomic History  . Marital status: Single    Spouse name: Not on file  . Number of children: 6  .  Years of education: Not on file  . Highest education level: Not on file  Occupational History    Comment: temp agency  Tobacco Use  . Smoking status: Current Every Day Smoker    Packs/day: 0.60    Years: 30.00    Pack years: 18.00    Types: Cigarettes  . Smokeless tobacco: Never Used  Substance and Sexual Activity  . Alcohol use: No    Alcohol/week: 0.0 standard drinks    Comment: former  . Drug use: No  . Sexual activity: Yes    Comment: given condoms  Other Topics Concern  . Not on file  Social History Narrative  . Not on file   Social Determinants of Health   Financial Resource Strain:   . Difficulty of Paying Living Expenses:   Food Insecurity:   . Worried About Charity fundraiser in the Last Year:   . Arboriculturist in the Last Year:   Transportation Needs:   . Film/video editor (Medical):   Marland Kitchen Lack of Transportation (Non-Medical):   Physical Activity:   . Days of Exercise per Week:   . Minutes of Exercise per Session:   Stress:   . Feeling of Stress :   Social Connections:   . Frequency of Communication with Friends and Family:   . Frequency of Social Gatherings with Friends and Family:   .  Attends Religious Services:   . Active Member of Clubs or Organizations:   . Attends Archivist Meetings:   Marland Kitchen Marital Status:     Labs: Lab Results  Component Value Date   HIV1RNAQUANT <20 DETECTED (A) 12/23/2019   HIV1RNAQUANT <20 DETECTED (A) 06/11/2019   HIV1RNAQUANT <20 NOT DETECTED 09/04/2018   CD4TABS 485 12/23/2019   CD4TABS 691 06/11/2019   CD4TABS 740 09/04/2018    RPR and STI Lab Results  Component Value Date   LABRPR NON-REACTIVE 12/23/2019   LABRPR NON-REACTIVE 06/11/2019   LABRPR NON-REACTIVE 09/04/2018   LABRPR NON-REACTIVE 01/15/2018   LABRPR NON REAC 02/06/2017    STI Results GC CT  06/11/2019 Negative Negative  02/06/2017 Negative Negative    Hepatitis B Lab Results  Component Value Date   HEPBSAB Negative 05/09/2008    HEPBSAG NEG 01/07/2009   Hepatitis C No results found for: HEPCAB, HCVRNAPCRQN Hepatitis A No results found for: HAV Lipids: Lab Results  Component Value Date   CHOL 249 (H) 06/11/2019   TRIG 234 (H) 06/11/2019   HDL 53 06/11/2019   CHOLHDL 4.7 06/11/2019   VLDL 40 (H) 02/06/2017   LDLCALC 156 (H) 06/11/2019    Current HIV Regimen: Tivicay + Symtuza  Assessment: Eric Jefferson presents today requesting refills for Tivicay and Symtuza as he only has 3 tablets left. He states that he is completely adherent to his HIV medications and has not missed any doses. He denies any side-effects. His ADAP has expired and per [facility], he has utilized his one-time medication refill voucher. Patient is aware that it will take 5-7 days for his new ADAP application to be approved before receiving his medications and he is okay with this. No labs today.  Plan: F/u with Eric Jefferson for ADAP application  Send HIV med refills to preferred pharmacy No labs today F/u with Dr. Johnnye Jefferson on 6/29  Lorel Monaco, PharmD PGY1 [facility] 03/24/2020, 3:33 PM

## 2020-03-24 NOTE — Telephone Encounter (Addendum)
[facility] Patient Advocate Encounter   Patient has been approved for [facility] Patient Assistance Program for [facility] for one time free medication refill. This assistance will make the patient's copay $0.  I have spoken with the patient and they will pick up today at [facility].  The billing information is Member ID: [id number] [place]: [place] Group: [id number]  Patient has limited [place] medicaid coverage for certain medication but does not cover specialty medications. Assistance program will cover [place] while his adap application is processing.   *[facility] pharmacist called back to inform us the patient has previously used these vouchers and will not be able to utilize them for these fills. He has a note to try the adap daily and has a note to call the patient the moment it is approved. Patient was made aware of these changes and will wait for the approval. He had no further questions and will go Friday or Tuesday to pick up his medications.

## 2020-03-26 ENCOUNTER — Encounter: Payer: Self-pay | Admitting: Infectious Diseases

## 2020-04-09 ENCOUNTER — Other Ambulatory Visit: Payer: Medicaid Other

## 2020-04-09 ENCOUNTER — Other Ambulatory Visit: Payer: Self-pay

## 2020-04-09 DIAGNOSIS — Z79899 Other long term (current) drug therapy: Secondary | ICD-10-CM

## 2020-04-09 DIAGNOSIS — B20 Human immunodeficiency virus [HIV] disease: Secondary | ICD-10-CM

## 2020-04-10 LAB — T-HELPER CELL (CD4) - (RCID CLINIC ONLY)
CD4 % Helper T Cell: 23 % — ABNORMAL LOW (ref 33–65)
CD4 T Cell Abs: 863 /uL (ref 400–1790)

## 2020-04-11 LAB — LIPID PANEL
Cholesterol: 253 mg/dL — ABNORMAL HIGH (ref <200)
HDL: 50 mg/dL (ref >=40)
LDL Cholesterol (Calc): 172 mg/dL (calc) — ABNORMAL HIGH
Non-HDL Cholesterol (Calc): 203 mg/dL (calc) — ABNORMAL HIGH (ref <130)
Total CHOL/HDL Ratio: 5.1 (calc) — ABNORMAL HIGH (ref <5.0)
Triglycerides: 164 mg/dL — ABNORMAL HIGH (ref <150)

## 2020-04-11 LAB — HIV-1 RNA QUANT-NO REFLEX-BLD
HIV 1 RNA Quant: <20 copies/mL
HIV-1 RNA Quant, Log: <1.3 Log copies/mL

## 2020-04-11 LAB — CBC
HCT: 43.9 % (ref 38.5–50.0)
Hemoglobin: 13.9 g/dL (ref 13.2–17.1)
MCH: 26.7 pg — ABNORMAL LOW (ref 27.0–33.0)
MCHC: 31.7 g/dL — ABNORMAL LOW (ref 32.0–36.0)
MCV: 84.4 fL (ref 80.0–100.0)
MPV: 10.1 fL (ref 7.5–12.5)
Platelets: 266 10*3/uL (ref 140–400)
RBC: 5.2 10*6/uL (ref 4.20–5.80)
RDW: 12.9 % (ref 11.0–15.0)
WBC: 10.5 10*3/uL (ref 3.8–10.8)

## 2020-04-11 LAB — COMPREHENSIVE METABOLIC PANEL
AG Ratio: 1.9 (calc) (ref 1.0–2.5)
ALT: 14 U/L (ref 9–46)
AST: 21 U/L (ref 10–35)
Albumin: 4.5 g/dL (ref 3.6–5.1)
Alkaline phosphatase (APISO): 77 U/L (ref 35–144)
BUN/Creatinine Ratio: 14 (calc) (ref 6–22)
BUN: 19 mg/dL (ref 7–25)
CO2: 26 mmol/L (ref 20–32)
Calcium: 8.8 mg/dL (ref 8.6–10.3)
Chloride: 104 mmol/L (ref 98–110)
Creat: 1.38 mg/dL — ABNORMAL HIGH (ref 0.70–1.25)
Globulin: 2.4 g/dL (calc) (ref 1.9–3.7)
Glucose, Bld: 95 mg/dL (ref 65–99)
Potassium: 4.1 mmol/L (ref 3.5–5.3)
Sodium: 138 mmol/L (ref 135–146)
Total Bilirubin: 0.4 mg/dL (ref 0.2–1.2)
Total Protein: 6.9 g/dL (ref 6.1–8.1)

## 2020-04-28 ENCOUNTER — Encounter: Payer: Medicaid Other | Admitting: Infectious Diseases

## 2020-07-03 ENCOUNTER — Telehealth: Payer: Self-pay

## 2020-07-03 NOTE — Telephone Encounter (Signed)
Patient arrived as a walk in requesting a work note excusing him from work today due to abdominal pain. Informed patient that there are no providers here in the office today to assess him and he would need to speak with a primary care doctor or go to urgent care if the pain is severe. Unable to provide a note at this time. Patient verbalized understanding.  Valery Chance Lorita Officer, RN

## 2020-07-07 ENCOUNTER — Telehealth: Payer: Self-pay

## 2020-07-07 ENCOUNTER — Encounter (HOSPITAL_COMMUNITY): Payer: Self-pay

## 2020-07-07 ENCOUNTER — Ambulatory Visit (HOSPITAL_COMMUNITY)
Admission: EM | Admit: 2020-07-07 | Discharge: 2020-07-07 | Disposition: A | Discharge status: Home / Self Care | Payer: Self-pay | Attending: Family Medicine | Admitting: Family Medicine

## 2020-07-07 ENCOUNTER — Other Ambulatory Visit: Payer: Self-pay

## 2020-07-07 DIAGNOSIS — R1084 Generalized abdominal pain: Secondary | ICD-10-CM | POA: Insufficient documentation

## 2020-07-07 LAB — CBC WITH DIFFERENTIAL/PLATELET
Abs Immature Granulocytes: 0.04 10*3/uL (ref 0.00–0.07)
Basophils Absolute: 0.1 10*3/uL (ref 0.0–0.1)
Basophils Relative: 1 %
Eosinophils Absolute: 0.3 10*3/uL (ref 0.0–0.5)
Eosinophils Relative: 2 %
HCT: 44.2 % (ref 39.0–52.0)
Hemoglobin: 14.3 g/dL (ref 13.0–17.0)
Immature Granulocytes: 0 %
Lymphocytes Relative: 36 %
Lymphs Abs: 3.9 10*3/uL (ref 0.7–4.0)
MCH: 27.9 pg (ref 26.0–34.0)
MCHC: 32.4 g/dL (ref 30.0–36.0)
MCV: 86.3 fL (ref 80.0–100.0)
Monocytes Absolute: 0.8 10*3/uL (ref 0.1–1.0)
Monocytes Relative: 7 %
Neutro Abs: 5.8 10*3/uL (ref 1.7–7.7)
Neutrophils Relative %: 54 %
Platelets: 277 10*3/uL (ref 150–400)
RBC: 5.12 MIL/uL (ref 4.22–5.81)
RDW: 14.4 % (ref 11.5–15.5)
WBC: 10.8 10*3/uL — ABNORMAL HIGH (ref 4.0–10.5)
nRBC: 0 % (ref 0.0–0.2)

## 2020-07-07 LAB — COMPREHENSIVE METABOLIC PANEL
ALT: 17 U/L (ref 0–44)
AST: 26 U/L (ref 15–41)
Albumin: 4.4 g/dL (ref 3.5–5.0)
Alkaline Phosphatase: 76 U/L (ref 38–126)
Anion gap: 10 (ref 5–15)
BUN: 12 mg/dL (ref 8–23)
CO2: 26 mmol/L (ref 22–32)
Calcium: 9.5 mg/dL (ref 8.9–10.3)
Chloride: 103 mmol/L (ref 98–111)
Creatinine, Ser: 1.39 mg/dL — ABNORMAL HIGH (ref 0.61–1.24)
GFR calc Af Amer: >60 mL/min (ref >60)
GFR calc non Af Amer: 54 mL/min — ABNORMAL LOW (ref >60)
Glucose, Bld: 93 mg/dL (ref 70–99)
Potassium: 5.1 mmol/L (ref 3.5–5.1)
Sodium: 139 mmol/L (ref 135–145)
Total Bilirubin: 0.6 mg/dL (ref 0.3–1.2)
Total Protein: 7.9 g/dL (ref 6.5–8.1)

## 2020-07-07 LAB — POCT URINALYSIS DIPSTICK, ED / UC
Bilirubin Urine: NEGATIVE
Glucose, UA: NEGATIVE mg/dL
Hgb urine dipstick: NEGATIVE
Ketones, ur: NEGATIVE mg/dL
Leukocytes,Ua: NEGATIVE
Nitrite: NEGATIVE
Protein, ur: NEGATIVE mg/dL
Specific Gravity, Urine: 1.015 (ref 1.005–1.030)
Urobilinogen, UA: 0.2 mg/dL (ref 0.0–1.0)
pH: 5.5 (ref 5.0–8.0)

## 2020-07-07 LAB — LIPASE, BLOOD: Lipase: 28 U/L (ref 11–51)

## 2020-07-07 MED ORDER — FAMOTIDINE 40 MG PO TABS: 40.0000 mg | ORAL_TABLET | Freq: Every day | ORAL | 0 refills | Status: DC

## 2020-07-07 NOTE — Telephone Encounter (Signed)
Received call today from Levada Dy, Employer requesting letter for patient to return to work. Patient was able to give verbal okay to disclose information to [place].  Called patient back with assistance from [place] interpreter. Advised he call his PCP or contact the provider who wrote him out of work. Patient verbalized understanding. [place]

## 2020-07-07 NOTE — ED Triage Notes (Signed)
Pt is here with abdominal pain that started this morning, pt has not taken any meds to relieve discomfort.

## 2020-07-07 NOTE — ED Provider Notes (Signed)
[name]   [id number] 07/07/20 Arrival Time: 1059  ASSESSMENT & PLAN:  1. Generalized abdominal pain     Benign abdominal exam. No indications for urgent abdominal/pelvic imaging at this time. Labs pending. Discussed.  Meds ordered this encounter  Medications  . famotidine (PEPCID) 40 MG tablet    Sig: Take 1 tablet (40 mg total) by mouth daily.    Dispense:  30 tablet    Refill:  0     Discharge Instructions     You have been seen today for abdominal pain. Your evaluation was not suggestive of any emergent condition requiring medical intervention at this time. However, some abdominal problems make take more time to appear. Therefore, it is very important for you to pay attention to any new symptoms or worsening of your current condition.  Please return here or to the Emergency Department immediately should you begin to feel worse in any way or have any of the following symptoms: increasing or different abdominal pain, persistent vomiting, inability to drink fluids, fevers, or shaking chills.       Follow-up Information    [place].   Specialty: Emergency Medicine Why: If symptoms worsen in any way. Contact information: [address] [id number] [place] [phone]              Question GERD component.  Reviewed expectations re: course of current medical issues. Questions answered. Outlined signs and symptoms indicating need for more acute intervention. Patient verbalized understanding. After Visit Summary given.   SUBJECTIVE: History from: patient. [place] video interpreter used.  Eric Jefferson is a 59 y.o. male who presents with complaint of generalized 8/10 abdominal pain this moring after waking. Less than a half hour. Has resolved. Question if felt more toward RLQ before subsiding. Afebrile. No flank or back pain. H/O sporadic GERD. No current tx. Ambulatory without  difficulty. Normal bowel/bladder habits. Normal PO intake without n/v/d.  History reviewed. No pertinent surgical history.   OBJECTIVE:  Vitals:   07/07/20 1307  BP: 135/84  Pulse: 61  Resp: 17  Temp: 98.3 F (36.8 C)  TempSrc: Oral  SpO2: 100%    General appearance: alert, oriented, no acute distress HEENT: [place]; AT; oropharynx moist Lungs: unlabored respirations Abdomen: soft; without distention; no specific tenderness to palpation; normal bowel sounds; without masses or organomegaly; without guarding or rebound tenderness Back: with reported CVA tenderness; FROM at waist Extremities: without LE edema; symmetrical; without gross deformities Skin: warm and dry Neurologic: normal gait Psychological: alert and cooperative; normal mood and affect  Labs: Results for orders placed or performed during the hospital encounter of 07/07/20  POCT Urinalysis Dipstick (ED/UC)  Result Value Ref Range   Glucose, UA NEGATIVE NEGATIVE mg/dL   Bilirubin Urine NEGATIVE NEGATIVE   Ketones, ur NEGATIVE NEGATIVE mg/dL   Specific Gravity, Urine 1.015 1.005 - 1.030   Hgb urine dipstick NEGATIVE NEGATIVE   pH 5.5 5.0 - 8.0   Protein, ur NEGATIVE NEGATIVE mg/dL   Urobilinogen, UA 0.2 0.0 - 1.0 mg/dL   Nitrite NEGATIVE NEGATIVE   Leukocytes,Ua NEGATIVE NEGATIVE   Labs Reviewed  COMPREHENSIVE METABOLIC PANEL  CBC WITH DIFFERENTIAL/PLATELET  LIPASE, BLOOD  POCT URINALYSIS DIPSTICK, ED / UC    Allergies  Allergen Reactions  . Soy Allergy     Upset stomach only- no other issues  Past Medical History:  Diagnosis Date  . Arthritis   . Diabetes ([place])    02-26-20 pt denies  . Headache   . HIV (human immunodeficiency virus infection) ([place])   . TB (pulmonary tuberculosis)    dx in 2009- took years worth of medication for this    Social History   Socioeconomic History  . Marital status: Single    Spouse name: Not on file  . Number of  children: 6  . Years of education: Not on file  . Highest education level: Not on file  Occupational History    Comment: temp agency  Tobacco Use  . Smoking status: Current Every Day Smoker    Packs/day: 0.60    Years: 30.00    Pack years: 18.00    Types: Cigarettes  . Smokeless tobacco: Never Used  Vaping Use  . Vaping Use: Never used  Substance and Sexual Activity  . Alcohol use: No    Alcohol/week: 0.0 standard drinks    Comment: former  . Drug use: No  . Sexual activity: Yes    Comment: given condoms  Other Topics Concern  . Not on file  Social History Narrative  . Not on file   Social Determinants of Health   Financial Resource Strain:   . Difficulty of Paying Living Expenses: Not on file  Food Insecurity:   . Worried About Charity fundraiser in the Last Year: Not on file  . Ran Out of Food in the Last Year: Not on file  Transportation Needs:   . Lack of Transportation (Medical): Not on file  . Lack of Transportation (Non-Medical): Not on file  Physical Activity:   . Days of Exercise per Week: Not on file  . Minutes of Exercise per Session: Not on file  Stress:   . Feeling of Stress : Not on file  Social Connections:   . Frequency of Communication with Friends and Family: Not on file  . Frequency of Social Gatherings with Friends and Family: Not on file  . Attends Religious Services: Not on file  . Active Member of Clubs or Organizations: Not on file  . Attends Archivist Meetings: Not on file  . Marital Status: Not on file  Intimate Partner Violence:   . Fear of Current or Ex-Partner: Not on file  . Emotionally Abused: Not on file  . Physically Abused: Not on file  . Sexually Abused: Not on file    Family History  Problem Relation Age of Onset  . Healthy Mother   . Healthy Father   . Colon cancer Neg Hx   . Esophageal cancer Neg Hx   . Rectal cancer Neg Hx   . Stomach cancer Neg Hx      Vanessa Kick, MD 07/07/20 1421

## 2020-07-07 NOTE — Telephone Encounter (Signed)
Patient walked in to office requesting doctors note for missed work. Translation services unavailable over the phone or video for [facility]. Re-explained to patient that he needs to be seen by a provider for a note. Suggested he go to urgent care for assessment. Also provided number for internal medicine to establish care for future issues. Patient verbalized understanding however unsure if he fully understood. Stated he would go to urgent care.   Jalacia Mattila Lorita Officer, RN

## 2020-07-07 NOTE — Discharge Instructions (Addendum)

## 2020-08-10 ENCOUNTER — Other Ambulatory Visit: Payer: Self-pay

## 2020-08-10 ENCOUNTER — Telehealth: Payer: Self-pay

## 2020-08-10 ENCOUNTER — Ambulatory Visit: Payer: Self-pay

## 2020-08-10 NOTE — Telephone Encounter (Signed)
-----   Message from [place] sent at 08/10/2020 10:00 AM EDT ----- Patient came in, said he went to the pharmacy to get his medication but they didn't have anything for him.. Told him I would inform

## 2020-08-10 NOTE — Telephone Encounter (Signed)
Spoke with patient and advised him that he will need to come in and renew his RW and UMAP before he could get his medications. Patient has been scheduled to come in and see Timmothy Sours today. Rease Wence T Brooks Sailors

## 2020-08-11 ENCOUNTER — Encounter: Payer: Self-pay | Admitting: Infectious Diseases

## 2020-08-17 ENCOUNTER — Telehealth: Payer: Self-pay

## 2020-08-17 NOTE — Telephone Encounter (Signed)
Received refill request from pharmacy for Eric Jefferson. Patient just recently re-certified for UMAP/RW. Per financial team status is still pending approval. Patient was given samples while in the office at that time to cover until UMAP approved. Patient is currently ok on refills and does not need any at this time. Eric Jefferson

## 2020-08-26 ENCOUNTER — Other Ambulatory Visit: Payer: Self-pay

## 2020-08-26 DIAGNOSIS — Z113 Encounter for screening for infections with a predominantly sexual mode of transmission: Secondary | ICD-10-CM

## 2020-08-26 DIAGNOSIS — B20 Human immunodeficiency virus [HIV] disease: Secondary | ICD-10-CM

## 2020-08-31 ENCOUNTER — Other Ambulatory Visit: Payer: Medicaid Other

## 2020-09-15 ENCOUNTER — Encounter: Payer: Self-pay | Admitting: Infectious Diseases

## 2020-09-15 ENCOUNTER — Ambulatory Visit (INDEPENDENT_AMBULATORY_CARE_PROVIDER_SITE_OTHER): Payer: Self-pay | Admitting: Infectious Diseases

## 2020-09-15 ENCOUNTER — Other Ambulatory Visit: Payer: Self-pay

## 2020-09-15 VITALS — BP 123/80 | HR 73 | Temp 98.3°F | Wt 119.0 lb

## 2020-09-15 DIAGNOSIS — Z113 Encounter for screening for infections with a predominantly sexual mode of transmission: Secondary | ICD-10-CM

## 2020-09-15 DIAGNOSIS — F17200 Nicotine dependence, unspecified, uncomplicated: Secondary | ICD-10-CM

## 2020-09-15 DIAGNOSIS — B20 Human immunodeficiency virus [HIV] disease: Secondary | ICD-10-CM

## 2020-09-15 DIAGNOSIS — S42302S Unspecified fracture of shaft of humerus, left arm, sequela: Secondary | ICD-10-CM

## 2020-09-15 DIAGNOSIS — Z79899 Other long term (current) drug therapy: Secondary | ICD-10-CM

## 2020-09-15 DIAGNOSIS — N1831 Chronic kidney disease, stage 3a: Secondary | ICD-10-CM

## 2020-09-15 DIAGNOSIS — Z23 Encounter for immunization: Secondary | ICD-10-CM

## 2020-09-15 MED ORDER — SYMTUZA 800-150-200-10 MG PO TABS: 1.0000 | ORAL_TABLET | Freq: Every day | ORAL | 5 refills | Status: DC

## 2020-09-15 MED ORDER — TIVICAY 50 MG PO TABS: 50.0000 mg | ORAL_TABLET | Freq: Every day | ORAL | 5 refills | Status: DC

## 2020-09-15 NOTE — Assessment & Plan Note (Signed)
He is doing well Given condoms Refills written  Flu shot today Has gotten COVID.  rtc in 9 months.

## 2020-09-15 NOTE — Progress Notes (Signed)
   Subjective:    Patient ID: Eric Jefferson, male    DOB: 1960-12-31, 59 y.o.   MRN: [id number]  HPI 59yo [place] M with HIV/AIDS, previously treated TB, adm to [facility] 2-23 to 12-29-12 with worsening SOB and nodular lung disease. His BAL grew histo and he was treated with itraconazole.   He had genoytpe 06-22-15 showing: Reverse Transcriptase Gene: Y181C, L210W, T215D  Protease Gene: A71V  Currently on tivicay-symtuza. Needs refill.  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  I explained his CT 01-2020 to pt with interpreter. Reinforced that he needs to see surgery.  Has gotten COVID vax, doesn't recall dates.  Smokes < 3 cig/day.   HIV 1 RNA Quant (copies/mL)  Date Value  04/09/2020 <20 NOT DETECTED  12/23/2019 <20 DETECTED (A)  06/11/2019 <20 DETECTED (A)   CD4 T Cell Abs (/uL)  Date Value  04/09/2020 863  12/23/2019 485  06/11/2019 691    Review of Systems  Constitutional: Negative for appetite change, fever and unexpected weight change.  Respiratory: Negative for cough and shortness of breath.   Gastrointestinal: Negative for constipation and diarrhea.  Genitourinary: Negative for difficulty urinating.  Please see HPI. All other systems reviewed and negative.      Objective:   Physical Exam Vitals reviewed.  Constitutional:      Appearance: He is not ill-appearing or diaphoretic.  HENT:     Mouth/Throat:     Mouth: Mucous membranes are moist.     Pharynx: No oropharyngeal exudate.  Eyes:     Extraocular Movements: Extraocular movements intact.     Pupils: Pupils are equal, round, and reactive to light.  Cardiovascular:     Rate and Rhythm: Normal rate and regular rhythm.  Pulmonary:     Effort: Pulmonary effort is normal.     Breath sounds: Normal breath sounds.  Abdominal:     General: Bowel sounds are normal. There is no distension.     Palpations: Abdomen is soft.     Tenderness: There is no abdominal tenderness.  Musculoskeletal:         Arms:     Cervical back: Normal range of motion and neck supple.     Right lower leg: No edema.     Left lower leg: No edema.  Neurological:     General: No focal deficit present.  Psychiatric:        Mood and Affect: Mood normal.           Assessment & Plan:

## 2020-09-15 NOTE — Assessment & Plan Note (Signed)
encourrged to quit.

## 2020-09-15 NOTE — Assessment & Plan Note (Signed)
Cr stable Will cont to watch

## 2020-09-15 NOTE — Assessment & Plan Note (Signed)
Will get him appt with Dr Tamera Punt as per prev notes with Dr Erlinda Hong

## 2020-09-22 MED ORDER — SYMTUZA 800-150-200-10 MG PO TABS: 1.0000 | ORAL_TABLET | Freq: Every day | ORAL | 5 refills | Status: DC

## 2020-09-22 MED ORDER — TIVICAY 50 MG PO TABS: 50.0000 mg | ORAL_TABLET | Freq: Every day | ORAL | 5 refills | Status: DC

## 2020-09-22 NOTE — Addendum Note (Signed)
Addended by: Eugenia Mcalpine on: 09/22/2020 11:42 AM   Modules accepted: Orders

## 2020-09-22 NOTE — Progress Notes (Signed)
ADAP approved. Medication previously sent to the wrong pharmacy. Rx transferred to [facility]. Via phone. Patient will need to use this Pharmacy for prescriptions to be covered.

## 2020-11-04 ENCOUNTER — Telehealth: Payer: Self-pay

## 2020-11-04 NOTE — Telephone Encounter (Signed)
Patient left voicemail on Triage line at [phone] requesting call back. Attempted to call patient with interpreter, but not able to reach him at this time. Voicemail is not set up at this time. Lorenso Courier, [place]

## 2020-11-25 ENCOUNTER — Inpatient Hospital Stay (HOSPITAL_COMMUNITY)
Admission: EM | Admit: 2020-11-25 | Discharge: 2020-12-01 | DRG: 975 | Disposition: A | Discharge status: Home / Self Care | Payer: Self-pay | Attending: Internal Medicine | Admitting: Internal Medicine

## 2020-11-25 ENCOUNTER — Emergency Department (HOSPITAL_COMMUNITY): Payer: Self-pay

## 2020-11-25 ENCOUNTER — Encounter (HOSPITAL_COMMUNITY): Payer: Self-pay

## 2020-11-25 DIAGNOSIS — R7401 Elevation of levels of liver transaminase levels: Secondary | ICD-10-CM | POA: Diagnosis present

## 2020-11-25 DIAGNOSIS — Z79899 Other long term (current) drug therapy: Secondary | ICD-10-CM

## 2020-11-25 DIAGNOSIS — J320 Chronic maxillary sinusitis: Secondary | ICD-10-CM | POA: Diagnosis present

## 2020-11-25 DIAGNOSIS — Z20822 Contact with and (suspected) exposure to covid-19: Secondary | ICD-10-CM | POA: Diagnosis present

## 2020-11-25 DIAGNOSIS — R451 Restlessness and agitation: Secondary | ICD-10-CM | POA: Diagnosis not present

## 2020-11-25 DIAGNOSIS — E876 Hypokalemia: Secondary | ICD-10-CM | POA: Diagnosis present

## 2020-11-25 DIAGNOSIS — G959 Disease of spinal cord, unspecified: Secondary | ICD-10-CM | POA: Diagnosis present

## 2020-11-25 DIAGNOSIS — Z91018 Allergy to other foods: Secondary | ICD-10-CM

## 2020-11-25 DIAGNOSIS — Z8611 Personal history of tuberculosis: Secondary | ICD-10-CM

## 2020-11-25 DIAGNOSIS — E785 Hyperlipidemia, unspecified: Secondary | ICD-10-CM | POA: Diagnosis present

## 2020-11-25 DIAGNOSIS — B20 Human immunodeficiency virus [HIV] disease: Secondary | ICD-10-CM | POA: Diagnosis present

## 2020-11-25 DIAGNOSIS — N183 Chronic kidney disease, stage 3 unspecified: Secondary | ICD-10-CM | POA: Diagnosis present

## 2020-11-25 DIAGNOSIS — G9341 Metabolic encephalopathy: Principal | ICD-10-CM | POA: Diagnosis present

## 2020-11-25 DIAGNOSIS — Z781 Physical restraint status: Secondary | ICD-10-CM

## 2020-11-25 DIAGNOSIS — F17210 Nicotine dependence, cigarettes, uncomplicated: Secondary | ICD-10-CM | POA: Diagnosis present

## 2020-11-25 DIAGNOSIS — E1122 Type 2 diabetes mellitus with diabetic chronic kidney disease: Secondary | ICD-10-CM | POA: Diagnosis present

## 2020-11-25 DIAGNOSIS — N179 Acute kidney failure, unspecified: Secondary | ICD-10-CM | POA: Diagnosis present

## 2020-11-25 DIAGNOSIS — F17200 Nicotine dependence, unspecified, uncomplicated: Secondary | ICD-10-CM | POA: Diagnosis present

## 2020-11-25 DIAGNOSIS — G9340 Encephalopathy, unspecified: Secondary | ICD-10-CM

## 2020-11-25 DIAGNOSIS — R4182 Altered mental status, unspecified: Secondary | ICD-10-CM | POA: Diagnosis present

## 2020-11-25 DIAGNOSIS — J323 Chronic sphenoidal sinusitis: Secondary | ICD-10-CM | POA: Diagnosis present

## 2020-11-25 LAB — LACTIC ACID, PLASMA: Lactic Acid, Venous: 1.1 mmol/L (ref 0.5–1.9)

## 2020-11-25 LAB — CBC
HCT: 45.8 % (ref 39.0–52.0)
Hemoglobin: 15.5 g/dL (ref 13.0–17.0)
MCH: 28.9 pg (ref 26.0–34.0)
MCHC: 33.8 g/dL (ref 30.0–36.0)
MCV: 85.4 fL (ref 80.0–100.0)
Platelets: 162 10*3/uL (ref 150–400)
RBC: 5.36 MIL/uL (ref 4.22–5.81)
RDW: 13.2 % (ref 11.5–15.5)
WBC: 5.5 10*3/uL (ref 4.0–10.5)
nRBC: 0 % (ref 0.0–0.2)

## 2020-11-25 LAB — BASIC METABOLIC PANEL
Anion gap: 11 (ref 5–15)
BUN: 17 mg/dL (ref 8–23)
CO2: 21 mmol/L — ABNORMAL LOW (ref 22–32)
Calcium: 8.7 mg/dL — ABNORMAL LOW (ref 8.9–10.3)
Chloride: 106 mmol/L (ref 98–111)
Creatinine, Ser: 1 mg/dL (ref 0.61–1.24)
GFR, Estimated: >60 mL/min (ref >60)
Glucose, Bld: 101 mg/dL — ABNORMAL HIGH (ref 70–99)
Potassium: 4.3 mmol/L (ref 3.5–5.1)
Sodium: 138 mmol/L (ref 135–145)

## 2020-11-25 NOTE — ED Triage Notes (Signed)
Pt has been having generalized weakness over the past 3 weeks, fevers for the past 2, cough. Pt has also been having AMS for the past few days, pt will not respond to family or questions.

## 2020-11-25 NOTE — ED Notes (Signed)
Unable to get a full set of vitals. Pt is very agitated and would not sit still

## 2020-11-26 ENCOUNTER — Observation Stay (HOSPITAL_COMMUNITY): Payer: Self-pay

## 2020-11-26 ENCOUNTER — Other Ambulatory Visit: Payer: Self-pay

## 2020-11-26 ENCOUNTER — Emergency Department (HOSPITAL_COMMUNITY): Payer: Self-pay

## 2020-11-26 DIAGNOSIS — F17200 Nicotine dependence, unspecified, uncomplicated: Secondary | ICD-10-CM

## 2020-11-26 DIAGNOSIS — R7401 Elevation of levels of liver transaminase levels: Secondary | ICD-10-CM

## 2020-11-26 DIAGNOSIS — R4182 Altered mental status, unspecified: Secondary | ICD-10-CM | POA: Diagnosis present

## 2020-11-26 DIAGNOSIS — B20 Human immunodeficiency virus [HIV] disease: Secondary | ICD-10-CM

## 2020-11-26 DIAGNOSIS — G9341 Metabolic encephalopathy: Principal | ICD-10-CM

## 2020-11-26 LAB — CRYPTOCOCCAL ANTIGEN: Crypto Ag: NEGATIVE

## 2020-11-26 LAB — GAMMA GT: GGT: 67 U/L — ABNORMAL HIGH (ref 7–50)

## 2020-11-26 LAB — RESPIRATORY PANEL BY PCR

## 2020-11-26 LAB — PROTIME-INR
INR: 1 (ref 0.8–1.2)
Prothrombin Time: 12.7 seconds (ref 11.4–15.2)

## 2020-11-26 LAB — HEPATIC FUNCTION PANEL
ALT: 57 U/L — ABNORMAL HIGH (ref 0–44)
AST: 79 U/L — ABNORMAL HIGH (ref 15–41)
Albumin: 3.4 g/dL — ABNORMAL LOW (ref 3.5–5.0)
Alkaline Phosphatase: 71 U/L (ref 38–126)
Bilirubin, Direct: 0.3 mg/dL — ABNORMAL HIGH (ref 0.0–0.2)
Indirect Bilirubin: 0.6 mg/dL (ref 0.3–0.9)
Total Bilirubin: 0.9 mg/dL (ref 0.3–1.2)
Total Protein: 6.7 g/dL (ref 6.5–8.1)

## 2020-11-26 LAB — AMMONIA: Ammonia: 16 umol/L (ref 9–35)

## 2020-11-26 LAB — TSH: TSH: 0.948 u[IU]/mL (ref 0.350–4.500)

## 2020-11-26 LAB — LACTIC ACID, PLASMA: Lactic Acid, Venous: 1.5 mmol/L (ref 0.5–1.9)

## 2020-11-26 LAB — VITAMIN B12: Vitamin B-12: 570 pg/mL (ref 180–914)

## 2020-11-26 LAB — SARS CORONAVIRUS 2 BY RT PCR (HOSPITAL ORDER, PERFORMED IN ~~LOC~~ HOSPITAL LAB): SARS Coronavirus 2: NEGATIVE

## 2020-11-26 LAB — CK: Total CK: 108 U/L (ref 49–397)

## 2020-11-26 LAB — ACETAMINOPHEN LEVEL: Acetaminophen (Tylenol), Serum: <10 ug/mL — ABNORMAL LOW (ref 10–30)

## 2020-11-26 LAB — SALICYLATE LEVEL: Salicylate Lvl: <7 mg/dL — ABNORMAL LOW (ref 7.0–30.0)

## 2020-11-26 LAB — APTT: aPTT: 42 seconds — ABNORMAL HIGH (ref 24–36)

## 2020-11-26 LAB — ETHANOL: Alcohol, Ethyl (B): <10 mg/dL (ref <10)

## 2020-11-26 LAB — CBG MONITORING, ED: Glucose-Capillary: 87 mg/dL (ref 70–99)

## 2020-11-26 MED ORDER — GADOBUTROL 1 MMOL/ML IV SOLN
5.0000 mL | Freq: Once | INTRAVENOUS | Status: AC | PRN
  Administered 2020-11-26: 5 mL via INTRAVENOUS

## 2020-11-26 MED ORDER — SODIUM CHLORIDE 0.9% FLUSH
3.0000 mL | Freq: Two times a day (BID) | INTRAVENOUS | Status: DC
  Administered 2020-11-26 – 2020-11-30 (×6): 3 mL via INTRAVENOUS

## 2020-11-26 MED ORDER — ALBUTEROL SULFATE (2.5 MG/3ML) 0.083% IN NEBU
2.5000 mg | INHALATION_SOLUTION | Freq: Four times a day (QID) | RESPIRATORY_TRACT | Status: DC | PRN
  Filled 2020-11-26: qty 3

## 2020-11-26 MED ORDER — ENOXAPARIN SODIUM 40 MG/0.4ML ~~LOC~~ SOLN
40.0000 mg | SUBCUTANEOUS | Status: DC
  Administered 2020-11-26: 40 mg via SUBCUTANEOUS
  Filled 2020-11-26: qty 0.4

## 2020-11-26 MED ORDER — LORAZEPAM 2 MG/ML IJ SOLN: 0.5000 mg | Freq: Once | INTRAMUSCULAR | Status: DC | PRN

## 2020-11-26 MED ORDER — ONDANSETRON HCL 4 MG PO TABS: 4.0000 mg | ORAL_TABLET | Freq: Four times a day (QID) | ORAL | Status: DC | PRN

## 2020-11-26 MED ORDER — ONDANSETRON HCL 4 MG/2ML IJ SOLN: 4.0000 mg | Freq: Four times a day (QID) | INTRAMUSCULAR | Status: DC | PRN

## 2020-11-26 MED ORDER — LORAZEPAM 2 MG/ML IJ SOLN
1.0000 mg | Freq: Once | INTRAMUSCULAR | Status: AC
  Administered 2020-11-26: 1 mg via INTRAVENOUS
  Filled 2020-11-26: qty 1

## 2020-11-26 MED ORDER — DEXTROSE 5 % IV SOLN
500.0000 mg | Freq: Three times a day (TID) | INTRAVENOUS | Status: DC
  Administered 2020-11-26 – 2020-11-30 (×11): 500 mg via INTRAVENOUS
  Filled 2020-11-26 (×14): qty 10

## 2020-11-26 MED ORDER — SODIUM CHLORIDE 0.9 % IV SOLN: INTRAVENOUS | Status: DC

## 2020-11-26 NOTE — H&P (Addendum)
History and Physical    Eric Jefferson DOB: April 30, 1961 DOA: 11/25/2020  Referring MD/NP/PA: Francia Greaves, DO PCP: [name], MD  Patient coming from: Home  Chief Complaint: Not eating or talking  I have personally briefly reviewed patient's old medical records in [place]   HPI: Eric Jefferson is a 60 y.o. 12 sspeacking male with medical history significant of HIV on HAART, pulmonary TB, hyperlipidemia, chronic kidney disease, and tobacco abuse presents with a 3-day history of not eating or talking.  History is obtained from his daughter present at bedside as the patient is altered and not talking.  Apparently everyone in the family got sick approximately 2 weeks ago with fever, cough, and sneezing.  Everyone gotten better except for the patient.  He reportedly continued to have fever up to 102 F at home, headaches, chills, and nonproductive cough.  Patient has been given Tylenol and Benadryl to try to treat symptoms.  Over the last 3 days patient stopped eating, drinking, talking, and was just lying in bed.  Family states that like he cannot hear them.  All he does a smile and stare.  ED Course:  On admission to the emergency department patient was seen to be afebrile with respirations 16-24, blood pressure is elevated up to 172/84, and O2 saturations maintained.  CT scan of the brain significant on for right maxillary and sphenoid sinusitis.  Labs for albumin 3.4, AST 79, ALT 57, ammonia 16.  Chest x-ray revealed emphysematous changes. COVID-19 screening was negative.  MRI of the brain had also been obtained and found to be negative.  Review of Systems  Unable to perform ROS: Mental status change  Constitutional: Positive for fever.  Respiratory: Positive for cough.   Neurological: Positive for weakness and headaches.    Past Medical History:  Diagnosis Date  . Arthritis   . Diabetes ([place])    02-26-20 pt denies  . Headache   . HIV (human immunodeficiency virus  infection) ([place])   . TB (pulmonary tuberculosis)    dx in 2009- took years worth of medication for this    History reviewed. No pertinent surgical history.   reports that he has been smoking cigarettes. He has a 18.00 pack-year smoking history. He has never used smokeless tobacco. He reports that he does not drink alcohol and does not use drugs.  Allergies  Allergen Reactions  . Soy Allergy     Upset stomach only- no other issues    Family History  Problem Relation Age of Onset  . Healthy Mother   . Healthy Father   . Colon cancer Neg Hx   . Esophageal cancer Neg Hx   . Rectal cancer Neg Hx   . Stomach cancer Neg Hx     Prior to Admission medications   Medication Sig Start Date End Date Taking? Authorizing Provider  Acetaminophen (TYLENOL PO) Take by mouth as needed.    [provider]  Darunavir-Cobicisctat-Emtricitabine-Tenofovir Alafenamide (SYMTUZA) 800-150-200-10 MG TABS Take 1 tablet by mouth daily with breakfast. Take with Tivicay. 09/22/20   [name], MD  dolutegravir (TIVICAY) 50 MG tablet Take 1 tablet (50 mg total) by mouth daily. Take with Symtuza. 09/22/20   [name], MD  famotidine (PEPCID) 40 MG tablet Take 1 tablet (40 mg total) by mouth daily. 07/07/20   Vanessa Kick, MD  atorvastatin (LIPITOR) 10 MG tablet TAKE 1 TABLET(10 MG) BY MOUTH DAILY Patient not taking: Reported on 03/04/2020 09/03/18 07/07/20  [name],  Doroteo Bradford, MD  traZODone (DESYREL) 50 MG tablet Take 0.5 tablets (25 mg total) by mouth at bedtime as needed for sleep. Patient not taking: Reported on 03/04/2020 12/23/19 07/07/20  [name], MD    Physical Exam:  Constitutional: Older male currently in NAD, but not responding to verbal commands Vitals:   11/25/20 2353 11/26/20 0311 11/26/20 0447 11/26/20 0700  BP:  102/90 128/85 105/82  Pulse: 97 94 83 76  Resp: 20 (!) 22 (!) 24 (!) 21  Temp:      TempSrc:      SpO2: 100% 97% 96% 100%   Eyes: PERRL, lids and  conjunctivae normal ENMT: Mucous membranes are dry.  Teeth were clenched. Neck: Some neck rigidity appreciated most notably with flexion forward Respiratory: clear to auscultation bilaterally, no wheezing, no crackles. Normal respiratory effort. No accessory muscle use.  Cardiovascular: Regular rate and rhythm, no murmurs / rubs / gallops. No extremity edema. 2+ pedal pulses. No carotid bruits.  Abdomen: Mild tenderness palpation. Musculoskeletal: no clubbing / cyanosis. No joint deformity upper and lower extremities. Good ROM, no contractures. Normal muscle tone.  Skin: no rashes, lesions, ulcers. No induration Neurologic: CN 2-12 grossly intact.  We will move extremities to noxious stimuli Psychiatric: Altered and unable to assess as patient is noncommunicative    Labs on Admission: I have personally reviewed following labs and imaging studies  CBC: Recent Labs  Lab 11/25/20 2234  WBC 5.5  HGB 15.5  HCT 45.8  MCV 85.4  PLT 0000000   Basic Metabolic Panel: Recent Labs  Lab 11/25/20 2234  NA 138  K 4.3  CL 106  CO2 21*  GLUCOSE 101*  BUN 17  CREATININE 1.00  CALCIUM 8.7*   GFR: CrCl cannot be calculated (Unknown ideal weight.). Liver Function Tests: Recent Labs  Lab 11/26/20 0700  AST 79*  ALT 57*  ALKPHOS 71  BILITOT 0.9  PROT 6.7  ALBUMIN 3.4*   No results for input(s): LIPASE, AMYLASE in the last 168 hours. Recent Labs  Lab 11/26/20 0700  AMMONIA 16   Coagulation Profile: No results for input(s): INR, PROTIME in the last 168 hours. Cardiac Enzymes: No results for input(s): CKTOTAL, CKMB, CKMBINDEX, TROPONINI in the last 168 hours. BNP (last 3 results) No results for input(s): PROBNP in the last 8760 hours. HbA1C: No results for input(s): HGBA1C in the last 72 hours. CBG: Recent Labs  Lab 11/26/20 0754  GLUCAP 87   Lipid Profile: No results for input(s): CHOL, HDL, LDLCALC, TRIG, CHOLHDL, LDLDIRECT in the last 72 hours. Thyroid Function  Tests: No results for input(s): TSH, T4TOTAL, FREET4, T3FREE, THYROIDAB in the last 72 hours. Anemia Panel: No results for input(s): VITAMINB12, FOLATE, FERRITIN, TIBC, IRON, RETICCTPCT in the last 72 hours. Urine analysis:    Component Value Date/Time   COLORURINE YELLOW 12/23/2019 0937   APPEARANCEUR CLEAR 12/23/2019 0937   LABSPEC 1.015 07/07/2020 1322   PHURINE 5.5 07/07/2020 1322   GLUCOSEU NEGATIVE 07/07/2020 1322   GLUCOSEU NEG mg/dL 12/09/2009 1819   HGBUR NEGATIVE 07/07/2020 1322   BILIRUBINUR NEGATIVE 07/07/2020 1322   KETONESUR NEGATIVE 07/07/2020 1322   PROTEINUR NEGATIVE 07/07/2020 1322   UROBILINOGEN 0.2 07/07/2020 1322   NITRITE NEGATIVE 07/07/2020 1322   LEUKOCYTESUR NEGATIVE 07/07/2020 1322   Sepsis Labs: Recent Results (from the past 240 hour(s))  SARS Coronavirus 2 by RT PCR (hospital order, performed in [facility] hospital lab) Nasopharyngeal Nasopharyngeal Swab     Status: None   Collection  Time: 11/25/20 11:26 PM   Specimen: Nasopharyngeal Swab  Result Value Ref Range Status   SARS Coronavirus 2 NEGATIVE NEGATIVE Final    Comment: (NOTE) SARS-CoV-2 target nucleic acids are NOT DETECTED.  The SARS-CoV-2 RNA is generally detectable in upper and lower respiratory specimens during the acute phase of infection. The lowest concentration of SARS-CoV-2 viral copies this assay can detect is 250 copies / mL. A negative result does not preclude SARS-CoV-2 infection and should not be used as the sole basis for treatment or other patient management decisions.  A negative result may occur with improper specimen collection / handling, submission of specimen other than nasopharyngeal swab, presence of viral mutation(s) within the areas targeted by this assay, and inadequate number of viral copies (<250 copies / mL). A negative result must be combined with clinical observations, patient history, and epidemiological information.  Fact Sheet for Patients:    StrictlyIdeas.no  Fact Sheet for Healthcare Providers: BankingDealers.co.za  This test is not yet approved or  cleared by the [place] FDA and has been authorized for detection and/or diagnosis of SARS-CoV-2 by FDA under an Emergency Use Authorization (EUA).  This EUA will remain in effect (meaning this test can be used) for the duration of the COVID-19 declaration under Section 564(b)(1) of the Act, 21 U.S.C. section 360bbb-3(b)(1), unless the authorization is terminated or revoked sooner.  Performed at [facility], [place] [address], [place], [place] [address]      Radiological Exams on Admission: CT HEAD WO CONTRAST  Result Date: 11/25/2020 CLINICAL DATA:  Weakness, altered level of consciousness for 3 days, nonverbal EXAM: CT HEAD WITHOUT CONTRAST TECHNIQUE: Contiguous axial images were obtained from the base of the skull through the vertex without intravenous contrast. COMPARISON:  06/17/2016 FINDINGS: Brain: No acute infarct or hemorrhage. Lateral ventricles and midline structures are unremarkable. No acute extra-axial fluid collections. No mass effect. Vascular: No hyperdense vessel or unexpected calcification. Skull: Normal. Negative for fracture or focal lesion. Sinuses/Orbits: Small gas fluid levels within the right maxillary and sphenoid sinuses. Mucoperiosteal thickening throughout the ethmoid air cells. Other: None. IMPRESSION: 1. No acute intracranial process. 2. Right maxillary and sphenoid sinusitis. Electronically Signed   By: Randa Ngo M.D.   On: 11/25/2020 23:13   MR BRAIN WO CONTRAST  Result Date: 11/26/2020 CLINICAL DATA:  60 year old male with weakness. Altered mental status. Cough. EXAM: MRI HEAD WITHOUT CONTRAST TECHNIQUE: Multiplanar, multiecho pulse sequences of the brain and surrounding structures were obtained without intravenous contrast. COMPARISON:  Head CT 11/25/2020 and earlier.  Brain MRI  08/14/2008. FINDINGS: Brain: Study is intermittently degraded by motion artifact despite repeated imaging attempts. Diffusion-weighted imaging is diagnostic and there is no restricted diffusion identified. No midline shift, mass effect, evidence of mass lesion, ventriculomegaly, extra-axial collection or acute intracranial hemorrhage. Cervicomedullary junction and pituitary are within normal limits. Largely normal for age gray and white matter signal throughout the brain. On SWI there are occasional chronic microhemorrhages in the left hemisphere (series 17, image 43). No cortical encephalomalacia identified. Deep gray nuclei, brainstem and cerebellum appear negative. Vascular: Major intracranial vascular flow voids are preserved. Skull and upper cervical spine: Stable visible cervical spine. Visualized bone marrow signal is within normal limits. Sinuses/Orbits: Negative orbits. Mild right maxillary and sphenoid sinus mucosal thickening is chronic. Other: Mastoids are well aerated. Grossly normal visible internal auditory structures. IMPRESSION: 1. Mildly motion degraded exam with no acute intracranial abnormality. 2. Largely negative for age noncontrast MRI appearance of the brain;  occasional chronic micro-hemorrhages suspected in the left hemisphere. Electronically Signed   By: Genevie Ann M.D.   On: 11/26/2020 07:11   DG Chest Portable 1 View  Result Date: 11/25/2020 CLINICAL DATA:  Fever and weakness for the past 3 weeks. Cough. Altered mental status for a few days. History of TB and diabetes. EXAM: PORTABLE CHEST 1 VIEW COMPARISON:  09/14/2017 FINDINGS: Slightly shallow inspiration. Heart size and pulmonary vascularity are normal. Emphysematous changes suggested in the lungs. No airspace disease or consolidation. No pleural effusions. No pneumothorax. Mediastinal contours appear intact. IMPRESSION: Emphysematous changes in the lungs. No evidence of active pulmonary disease. Electronically Signed   By: Lucienne Capers M.D.   On: 11/25/2020 22:37    EKG: Independently reviewed.  Sinus rhythm at 96 bpm with right atrial enlargement and right axis deviation  Assessment/Plan Acute metabolic encephalopathy: Patient noted to be not eating, talking, or ambulating over the last 3 days.  Recently family sick with some illness causing fever and cough.  COVID-19 screening was negative.  On physical exam patient noted to have some rigidity neck and not following commands.  Initial work-up including CT scan of the brain and MRI without contrast negative.  RPR had previously been negative in 2/21.  Question of possibility of encephalitis/meningitis versus seizure-like activity. -Admit to a medical telemetry bed -Neurochecks w/ seizure precautions -Check respiratory virus panel -Check urine drug screen -Check acetaminophen, salicylate, and ethanol levels -Follow-up urinalysis -Normal saline IV fluids at 75 mL/h -Neurology consulted recommending obtaining MRI of the brain with [place] and lumbar puncture  -Plan to likely start empiric antibiotics after LP   HIV: Patient on Tivicay and [place] followed by Dr. [name] of ID.  Last CD4 count 863 on 04/09/2020.  Patient possibly been without these medications for 3 days now. -Check CD4 count -Restart home medications when able -ID consulted we will follow-up for further recommendations  Transaminitis: Acute.  On admission AST 79 and  ALT 57.  Prior hepatitis panel from 2010 was negative. -Continue to monitor  History of pulmonary tuberculosis  Tobacco abuse: Patient currently smokes cigarettes. -Once acute medical problems improving will continue to educate on need of cessation of tobacco use  DVT prophylaxis: Lovenox Code Status: Full Family Communication: Daughter updated at bedside Disposition Plan: To be determined Consults called: ID and neurology Admission status: Inpatient, require more than 2 midnight stay  Norval Morton MD [facility]   If 7PM-7AM, please contact night-coverage   11/26/2020, 8:17 AM

## 2020-11-26 NOTE — ED Notes (Signed)
pts daughter notified staff that pt had became more altered, shaking, and not looking when name is called. Pt was not able to stand. Per daughter pt has not been able to move or talk the last 3 days. At baseline pt is able to ambulate and communicate. All VS WNL. Pt made eye contact when this staff member called his name. Pt would not answer any questions that the daughter would ask. Pt was assisted by staff to recliner.

## 2020-11-26 NOTE — ED Provider Notes (Signed)
[facility] EMERGENCY DEPARTMENT Provider Note   CSN: [id number] Arrival date & time: 11/25/20  2211   History Chief Complaint  Patient presents with  . Weakness  . Altered Mental Status    Eric Jefferson is a 60 y.o. male.  The history is provided by a relative. The history is limited by the condition of the patient (Altered mental status).  Weakness Altered Mental Status Associated symptoms: weakness   He has history of HIV disease, pulmonary tuberculosis and is brought in because of altered mental status.  He has been sick for the last 2 weeks with fevers up to 102 degrees with associated cough.  Other family members had been ill as well but had tested negative for COVID-19.  Over the last 3 days, patient has stopped eating and is no longer communicating with them.  There has been no vomiting or diarrhea.   Past Medical History:  Diagnosis Date  . Arthritis   . Diabetes ([place])    02-26-20 pt denies  . Headache   . HIV (human immunodeficiency virus infection) ([place])   . TB (pulmonary tuberculosis)    dx in 2009- took years worth of medication for this    Patient Active Problem List   Diagnosis Date Noted  . Closed left arm fracture 12/23/2019  . CKD (chronic kidney disease) stage 3, GFR 30-59 ml/min (H[facility]) 12/23/2019  . Insomnia 12/23/2019  . Hyperglycemia 09/18/2018  . Seasonal allergies 01/31/2018  . Anorexia 07/15/2015  . Abdominal pain 02/09/2015  . Otitis media 02/09/2015  . Loss of weight 12/24/2012  . 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 Dental caries 09/09/2009  . Human immunodeficiency virus (HIV) disease ([place]) 05/06/2009  . Pulmonary tuberculosis 05/09/2008  . TOBACCO ABUSE 02/04/2008  . GERD 02/04/2008    History reviewed. No pertinent surgical history.     Family History  Problem Relation Age of Onset  . Healthy Mother   . Healthy Father   . Colon cancer Neg Hx   . Esophageal cancer Neg Hx   . Rectal cancer Neg  Hx   . Stomach cancer Neg Hx     Social History   Tobacco Use  . Smoking status: Current Every Day Smoker    Packs/day: 0.60    Years: 30.00    Pack years: 18.00    Types: Cigarettes  . Smokeless tobacco: Never Used  Vaping Use  . Vaping Use: Never used  Substance Use Topics  . Alcohol use: No    Alcohol/week: 0.0 standard drinks    Comment: former  . Drug use: No    Home Medications Prior to Admission medications   Medication Sig Start Date End Date Taking? Authorizing Provider  Acetaminophen (TYLENOL PO) Take by mouth as needed.    [provider]  Darunavir-Cobicisctat-Emtricitabine-Tenofovir Alafenamide (SYMTUZA) 800-150-200-10 MG TABS Take 1 tablet by mouth daily with breakfast. Take with Tivicay. 09/22/20   [name], MD  dolutegravir (TIVICAY) 50 MG tablet Take 1 tablet (50 mg total) by mouth daily. Take with Symtuza. 09/22/20   [name], MD  famotidine (PEPCID) 40 MG tablet Take 1 tablet (40 mg total) by mouth daily. 07/07/20   Vanessa Kick, MD  atorvastatin (LIPITOR) 10 MG tablet TAKE 1 TABLET(10 MG) BY MOUTH DAILY Patient not taking: Reported on 03/04/2020 09/03/18 07/07/20  [name], MD  traZODone (DESYREL) 50 MG tablet Take 0.5 tablets (25 mg total) by mouth at bedtime as needed for sleep. Patient not  taking: Reported on 03/04/2020 12/23/19 07/07/20  [name], MD    Allergies    Soy allergy  Review of Systems   Review of Systems  Unable to perform ROS: Mental status change  Neurological: Positive for weakness.    Physical Exam Updated Vital Signs BP 128/85   Pulse 83   Temp 97.8 F (36.6 C) (Oral)   Resp (!) 24   SpO2 96%   Physical Exam Vitals and nursing note reviewed.   60 year old male, resting comfortably and in no acute distress. Vital signs are significant for slightly elevated respiratory rate. Oxygen saturation is 96%, which is normal. Head is normocephalic and atraumatic. PERRLA, EOMI. Oropharynx is  clear. Neck is nontender and supple without adenopathy or JVD. Back is nontender and there is no CVA tenderness. Lungs are clear without rales, wheezes, or rhonchi. Chest is nontender. Heart has regular rate and rhythm without murmur. Abdomen is soft, flat, nontender without masses or hepatosplenomegaly and peristalsis is normoactive. Extremities have no cyanosis or edema, full range of motion is present. Skin is warm and dry without rash. Neurologic: Awake but nonverbal, will not answer questions or follow commands, cranial nerves are intact, there are no motor or sensory deficits.  ED Results / Procedures / Treatments   Labs (all labs ordered are listed, but only abnormal results are displayed) Labs Reviewed  BASIC METABOLIC PANEL - Abnormal; Notable for the following components:      Result Value   CO2 21 (*)    Glucose, Bld 101 (*)    Calcium 8.7 (*)    All other components within normal limits  SARS CORONAVIRUS 2 BY RT PCR (HOSPITAL ORDER, [place] [facility])  CBC  LACTIC ACID, PLASMA  URINALYSIS, ROUTINE W REFLEX MICROSCOPIC  LACTIC ACID, PLASMA  HEPATIC FUNCTION PANEL  AMMONIA  RAPID URINE DRUG SCREEN, HOSP PERFORMED  CBG MONITORING, ED    EKG EKG Interpretation  Date/Time:  Wednesday November 25 2020 22:27:37 EST Ventricular Rate:  96 PR Interval:  134 QRS Duration: 86 QT Interval:  354 QTC Calculation: 447 R Axis:   97 Text Interpretation: Normal sinus rhythm Right atrial enlargement Rightward axis Pulmonary disease pattern Nonspecific ST abnormality Abnormal ECG When compared with ECG of 09/15/2017, HEART RATE has increased Confirmed by Delora Fuel ([id number]) on 11/25/2020 11:59:32 PM   Radiology CT HEAD WO CONTRAST  Result Date: 11/25/2020 CLINICAL DATA:  Weakness, altered level of consciousness for 3 days, nonverbal EXAM: CT HEAD WITHOUT CONTRAST TECHNIQUE: Contiguous axial images were obtained from the base of the skull through the vertex  without intravenous contrast. COMPARISON:  06/17/2016 FINDINGS: Brain: No acute infarct or hemorrhage. Lateral ventricles and midline structures are unremarkable. No acute extra-axial fluid collections. No mass effect. Vascular: No hyperdense vessel or unexpected calcification. Skull: Normal. Negative for fracture or focal lesion. Sinuses/Orbits: Small gas fluid levels within the right maxillary and sphenoid sinuses. Mucoperiosteal thickening throughout the ethmoid air cells. Other: None. IMPRESSION: 1. No acute intracranial process. 2. Right maxillary and sphenoid sinusitis. Electronically Signed   By: Randa Ngo M.D.   On: 11/25/2020 23:13   DG Chest Portable 1 View  Result Date: 11/25/2020 CLINICAL DATA:  Fever and weakness for the past 3 weeks. Cough. Altered mental status for a few days. History of TB and diabetes. EXAM: PORTABLE CHEST 1 VIEW COMPARISON:  09/14/2017 FINDINGS: Slightly shallow inspiration. Heart size and pulmonary vascularity are normal. Emphysematous changes suggested in the lungs. No airspace  disease or consolidation. No pleural effusions. No pneumothorax. Mediastinal contours appear intact. IMPRESSION: Emphysematous changes in the lungs. No evidence of active pulmonary disease. Electronically Signed   By: Lucienne Capers M.D.   On: 11/25/2020 22:37    Procedures Procedures   Medications Ordered in ED Medications - No data to display  ED Course  I have reviewed the triage vital signs and the nursing notes.  Pertinent labs & imaging results that were available during my care of the patient were reviewed by me and considered in my medical decision making (see chart for details).  MDM Rules/Calculators/A&P Altered mental status, etiology unclear.  Recent febrile illness.  COVID-19 PCR was negative.  Chest x-ray shows no active pulmonary disease.  CT of head shows no acute intracranial process although there is evidence of right maxillary and sphenoid sinusitis.  ECG is  unchanged from prior.  Electrolytes are normal, renal function normal, CBC is normal.  Old records are reviewed, and his HIV viral studies have been undetectable since 2016.  Will check hepatic function panel, ammonia level and send for MRI of the brain.  MRI of the brain appears grossly normal but per my reading, radiologist interpretation pending.  Hepatic function panel and ammonia level are still pending.  Case has been discussed with Dr. [place] Crane of [facility], who agrees to admit the patient.  Final Clinical Impression(s) / ED Diagnoses Final diagnoses:  Acute encephalopathy    Rx / DC Orders ED Discharge Orders    None       Delora Fuel, MD 50/53/97 [phone]

## 2020-11-26 NOTE — Progress Notes (Signed)
Pharmacy Antibiotic Note  Eric Jefferson is a 60 y.o. male with history of well-controlled HIV admitted on 11/25/2020 with 3 day history of decreased oral intake and non-conversant with family.  Bedside LP unsuccessful.  Pharmacy has been consulted for acyclovir dosing for rule out encephalitis/meningitis.  SCr 1, CrCL 60-80 ml/min, afebrile, WBC WNL.  Plan: Acyclovir 500mg  IV Q8H Monitor renal fxn, clinical progress  Weight: 54 kg (119 lb 0.8 oz)  Temp (24hrs), Avg:98.2 F (36.8 C), Min:97.8 F (36.6 C), Max:98.7 F (37.1 C)  Recent Labs  Lab 11/25/20 2234 11/26/20 0700  WBC 5.5  --   CREATININE 1.00  --   LATICACIDVEN 1.1 1.5    CrCl cannot be calculated (Unknown ideal weight.).    Allergies  Allergen Reactions  . Soy Allergy     Upset stomach only- no other issues    Acyclovir 1/27 >>   1/26 covid - negative 1/27 resp pane PCR - negative  Dennis Killilea D. Mina Marble, PharmD, BCPS, [place] 11/26/2020, 7:16 PM

## 2020-11-26 NOTE — Consult Note (Signed)
Eric Jefferson for Infectious Disease         Reason for Consult: AMS with HIV disease   Referring Physician: Tamala Julian  Principal Problem:   Acute metabolic encephalopathy Active Problems:   Human immunodeficiency virus (HIV) disease ([place])   TOBACCO ABUSE   Transaminitis    HPI: Eric Jefferson is a 60 y.o. male with well controlled HIV disease, CD 4 count of 863/VL<20 in June 2022 on tivicay and symtuza. Patient had been doing well up until 3 days ago with decrease oral intake, non conversant to his family. Family reports everyone had upper respiratory illness/flu like illness but had tested negative for covid. Patient no history of fever per family. He is unable to answer questions, which is not his baseline. ID asked to weigh in. His labs do not show any AKI due to dehydration, his WBC is WNL. Has mild transaminitis of 60-80s. MRI without contrast does not show any abnormality  Past Medical History:  Diagnosis Date  . Arthritis   . Diabetes ([place])    02-26-20 pt denies  . Headache   . HIV (human immunodeficiency virus infection) ([place])   . TB (pulmonary tuberculosis)    dx in 2009- took years worth of medication for this    Allergies:  Allergies  Allergen Reactions  . Soy Allergy     Upset stomach only- no other issues    MEDICATIONS: . enoxaparin (LOVENOX) injection  40 mg Subcutaneous Q24H  . sodium chloride flush  3 mL Intravenous Q12H    Social History   Tobacco Use  . Smoking status: Current Every Day Smoker    Packs/day: 0.60    Years: 30.00    Pack years: 18.00    Types: Cigarettes  . Smokeless tobacco: Never Used  Vaping Use  . Vaping Use: Never used  Substance Use Topics  . Alcohol use: No    Alcohol/week: 0.0 standard drinks    Comment: former  . Drug use: No    Family History  Problem Relation Age of Onset  . Healthy Mother   . Healthy Father   . Colon cancer Neg Hx   . Esophageal cancer Neg Hx   . Rectal cancer Neg Hx   . Stomach cancer Neg Hx      Review of Systems -  Unable to obtain due to encephalopathy  OBJECTIVE: Temp:  [97.8 F (36.6 C)-98.7 F (37.1 C)] 98 F (36.7 C) (01/27 1559) Pulse Rate:  [76-97] 84 (01/27 1559) Resp:  [15-24] 18 (01/27 1559) BP: (102-172)/(79-95) 120/86 (01/27 1559) SpO2:  [96 %-100 %] 99 % (01/27 1559) Physical Exam  Constitutional: He is oriented to person, place, and time. He appears well-developed and well-nourished. No distress.  HENT:  Mouth/Throat: Oropharynx is clear and moist. No oropharyngeal exudate.  Cardiovascular: Normal rate, regular rhythm and normal heart sounds. Exam reveals no gallop and no friction rub.  No murmur heard.  Pulmonary/Chest: Effort normal and breath sounds normal. No respiratory distress. He has no wheezes.  Abdominal: Soft. Bowel sounds are normal. He exhibits no distension. There is no tenderness.  Lymphadenopathy:  He has no cervical adenopathy.  Neurological: He is alert and oriented to person, place, and time.  Skin: Skin is warm and dry. No rash noted. No erythema.  Psychiatric: He has a normal mood and affect. His behavior is normal.    LABS: Results for orders placed or performed during the hospital encounter of 11/25/20 (from the past 48 hour(s))  Basic  metabolic panel     Status: Abnormal   Collection Time: 11/25/20 10:34 PM  Result Value Ref Range   Sodium 138 135 - 145 mmol/L   Potassium 4.3 3.5 - 5.1 mmol/L   Chloride 106 98 - 111 mmol/L   CO2 21 (L) 22 - 32 mmol/L   Glucose, Bld 101 (H) 70 - 99 mg/dL    Comment: Glucose reference range applies only to samples taken after fasting for at least 8 hours.   BUN 17 8 - 23 mg/dL   Creatinine, Ser 1.00 0.61 - 1.24 mg/dL   Calcium 8.7 (L) 8.9 - 10.3 mg/dL   GFR, Estimated >60 >60 mL/min    Comment: (NOTE) Calculated using the CKD-EPI Creatinine Equation (2021)    Anion gap 11 5 - 15    Comment: Performed at [facility] [address], [place] [address]  CBC     Status:  None   Collection Time: 11/25/20 10:34 PM  Result Value Ref Range   WBC 5.5 4.0 - 10.5 K/uL   RBC 5.36 4.22 - 5.81 MIL/uL   Hemoglobin 15.5 13.0 - 17.0 g/dL   HCT 45.8 39.0 - 52.0 %   MCV 85.4 80.0 - 100.0 fL   MCH 28.9 26.0 - 34.0 pg   MCHC 33.8 30.0 - 36.0 g/dL   RDW 13.2 11.5 - 15.5 %   Platelets 162 150 - 400 K/uL   nRBC 0.0 0.0 - 0.2 %    Comment: Performed at [facility], [place] [address] [place], [place] [address]  Lactic acid, plasma     Status: None   Collection Time: 11/25/20 10:34 PM  Result Value Ref Range   Lactic Acid, Venous 1.1 0.5 - 1.9 mmol/L    Comment: Performed at [facility] [address], [place], [place] [address]  SARS Coronavirus 2 by RT PCR (hospital order, performed in [facility] hospital lab) Nasopharyngeal Nasopharyngeal Swab     Status: None   Collection Time: 11/25/20 11:26 PM   Specimen: Nasopharyngeal Swab  Result Value Ref Range   SARS Coronavirus 2 NEGATIVE NEGATIVE    Comment: (NOTE) SARS-CoV-2 target nucleic acids are NOT DETECTED.  The SARS-CoV-2 RNA is generally detectable in upper and lower respiratory specimens during the acute phase of infection. The lowest concentration of SARS-CoV-2 viral copies this assay can detect is 250 copies / mL. A negative result does not preclude SARS-CoV-2 infection and should not be used as the sole basis for treatment or other patient management decisions.  A negative result may occur with improper specimen collection / handling, submission of specimen other than nasopharyngeal swab, presence of viral mutation(s) within the areas targeted by this assay, and inadequate number of viral copies (<250 copies / mL). A negative result must be combined with clinical observations, patient history, and epidemiological information.  Fact Sheet for Patients:   StrictlyIdeas.no  Fact Sheet for Healthcare Providers: BankingDealers.co.za  This test is  not yet approved or  cleared by the [place] FDA and has been authorized for detection and/or diagnosis of SARS-CoV-2 by FDA under an Emergency Use Authorization (EUA).  This EUA will remain in effect (meaning this test can be used) for the duration of the COVID-19 declaration under Section 564(b)(1) of the Act, 21 U.S.C. section 360bbb-3(b)(1), unless the authorization is terminated or revoked sooner.  Performed at [facility], [place] [address], [place], [place] [address]   Lactic acid, plasma  Status: None   Collection Time: 11/26/20  7:00 AM  Result Value Ref Range   Lactic Acid, Venous 1.5 0.5 - 1.9 mmol/L    Comment: Performed at [facility], [place] [address] [place], [place] [address]  Hepatic function panel     Status: Abnormal   Collection Time: 11/26/20  7:00 AM  Result Value Ref Range   Total Protein 6.7 6.5 - 8.1 g/dL   Albumin 3.4 (L) 3.5 - 5.0 g/dL   AST 79 (H) 15 - 41 U/L    Comment: SPECIMEN HEMOLYZED. HEMOLYSIS MAY AFFECT INTEGRITY OF RESULTS.   ALT 57 (H) 0 - 44 U/L    Comment: SPECIMEN HEMOLYZED. HEMOLYSIS MAY AFFECT INTEGRITY OF RESULTS.   Alkaline Phosphatase 71 38 - 126 U/L    Comment: SPECIMEN HEMOLYZED. HEMOLYSIS MAY AFFECT INTEGRITY OF RESULTS.   Total Bilirubin 0.9 0.3 - 1.2 mg/dL    Comment: SPECIMEN HEMOLYZED. HEMOLYSIS MAY AFFECT INTEGRITY OF RESULTS.   Bilirubin, Direct 0.3 (H) 0.0 - 0.2 mg/dL    Comment: SPECIMEN HEMOLYZED. HEMOLYSIS MAY AFFECT INTEGRITY OF RESULTS.   Indirect Bilirubin 0.6 0.3 - 0.9 mg/dL    Comment: Performed at [facility], [place] [address], [place], [place] [address]  Ammonia     Status: None   Collection Time: 11/26/20  7:00 AM  Result Value Ref Range   Ammonia 16 9 - 35 umol/L    Comment: Performed at [facility], [place] [address], [place], [place] [address]  CK     Status: None   Collection Time: 11/26/20  7:00 AM  Result Value Ref Range   Total CK 108 49 - 397 U/L    Comment: Performed at  [facility], [place] [address] [place], [place] [address]  Gamma GT     Status: Abnormal   Collection Time: 11/26/20  7:00 AM  Result Value Ref Range   GGT 67 (H) 7 - 50 U/L    Comment: Performed at [facility], [place] [address], [place], [place] [address]  CBG monitoring, ED     Status: None   Collection Time: 11/26/20  7:54 AM  Result Value Ref Range   Glucose-Capillary 87 70 - 99 mg/dL    Comment: Glucose reference range applies only to samples taken after fasting for at least 8 hours.  Acetaminophen level     Status: Abnormal   Collection Time: 11/26/20 11:07 AM  Result Value Ref Range   Acetaminophen (Tylenol), Serum <10 (L) 10 - 30 ug/mL    Comment: (NOTE) Therapeutic concentrations vary significantly. A range of 10-30 ug/mL  may be an effective concentration for many patients. However, some  are best treated at concentrations outside of this range. Acetaminophen concentrations >150 ug/mL at 4 hours after ingestion  and >50 ug/mL at 12 hours after ingestion are often associated with  toxic reactions.  Performed at [facility], [place] [address], [place], [place] [address]   Salicylate level     Status: Abnormal   Collection Time: 11/26/20 11:07 AM  Result Value Ref Range   Salicylate Lvl <5.7 (L) 7.0 - 30.0 mg/dL    Comment: Performed at [facility] [address] [place],  [address]  Ethanol     Status: None   Collection Time: 11/26/20 11:07 AM  Result Value Ref Range   Alcohol, Ethyl (B) <10 <10 mg/dL    Comment: (NOTE) Lowest detectable limit for serum  alcohol is 10 mg/dL.  For medical purposes only. Performed at [facility], [place] [address] [place], [place] [address]   Vitamin B12     Status: None   Collection Time: 11/26/20 11:07 AM  Result Value Ref Range   Vitamin B-12 570 180 - 914 pg/mL    Comment: (NOTE) This assay is not validated for testing neonatal or myeloproliferative syndrome specimens for Vitamin B12  levels. Performed at [facility], [place] [address] [place], [place] [address]   TSH     Status: None   Collection Time: 11/26/20 11:07 AM  Result Value Ref Range   TSH 0.948 0.350 - 4.500 uIU/mL    Comment: Performed by a 3rd Generation assay with a functional sensitivity of <=0.01 uIU/mL. Performed at [facility], [place] [address], [place], [place] [address]   Cryptococcal antigen     Status: None   Collection Time: 11/26/20 11:07 AM  Result Value Ref Range   Crypto Ag NEGATIVE NEGATIVE   Cryptococcal Ag Titer NOT INDICATED NOT INDICATED    Comment: Performed at [facility], [place] [address], [place], [place] [address]  Respiratory (~20 pathogens) panel by PCR     Status: None   Collection Time: 11/26/20  2:17 PM   Specimen: Nasopharyngeal Swab; Respiratory  Result Value Ref Range   Adenovirus NOT DETECTED NOT DETECTED   Coronavirus 229E NOT DETECTED NOT DETECTED    Comment: (NOTE) The Coronavirus on the Respiratory Panel, DOES NOT test for the novel  Coronavirus (2019 nCoV)    Coronavirus HKU1 NOT DETECTED NOT DETECTED   Coronavirus NL63 NOT DETECTED NOT DETECTED   Coronavirus OC43 NOT DETECTED NOT DETECTED   Metapneumovirus NOT DETECTED NOT DETECTED   Rhinovirus / Enterovirus NOT DETECTED NOT DETECTED   Influenza A NOT DETECTED NOT DETECTED   Influenza B NOT DETECTED NOT DETECTED   Parainfluenza Virus 1 NOT DETECTED NOT DETECTED   Parainfluenza Virus 2 NOT DETECTED NOT DETECTED   Parainfluenza Virus 3 NOT DETECTED NOT DETECTED   Parainfluenza Virus 4 NOT DETECTED NOT DETECTED   Respiratory Syncytial Virus NOT DETECTED NOT DETECTED   Bordetella pertussis NOT DETECTED NOT DETECTED   Bordetella Parapertussis NOT DETECTED NOT DETECTED   Chlamydophila pneumoniae NOT DETECTED NOT DETECTED   Mycoplasma pneumoniae NOT DETECTED NOT DETECTED    Comment: Performed at [facility], [place] [address], [place], [place] [address]  Protime-INR     Status: None    Collection Time: 11/26/20  2:40 PM  Result Value Ref Range   Prothrombin Time 12.7 11.4 - 15.2 seconds   INR 1.0 0.8 - 1.2    Comment: (NOTE) INR goal varies based on device and disease states. Performed at [facility], [place] [address], [place], [place] [address]   APTT     Status: Abnormal   Collection Time: 11/26/20  2:40 PM  Result Value Ref Range   aPTT 42 (H) 24 - 36 seconds    Comment:        IF BASELINE aPTT IS ELEVATED, SUGGEST PATIENT RISK ASSESSMENT BE USED TO DETERMINE APPROPRIATE ANTICOAGULANT THERAPY. Performed at [facility], [place] [address], [place], [place] [address]     MICRO:  IMAGING: CT HEAD WO CONTRAST  Result Date: 11/25/2020 CLINICAL DATA:  Weakness, altered level of consciousness for 3 days, nonverbal EXAM: CT HEAD WITHOUT CONTRAST TECHNIQUE: Contiguous axial images were obtained from the base of the skull  through the vertex without intravenous contrast. COMPARISON:  06/17/2016 FINDINGS: Brain: No acute infarct or hemorrhage. Lateral ventricles and midline structures are unremarkable. No acute extra-axial fluid collections. No mass effect. Vascular: No hyperdense vessel or unexpected calcification. Skull: Normal. Negative for fracture or focal lesion. Sinuses/Orbits: Small gas fluid levels within the right maxillary and sphenoid sinuses. Mucoperiosteal thickening throughout the ethmoid air cells. Other: None. IMPRESSION: 1. No acute intracranial process. 2. Right maxillary and sphenoid sinusitis. Electronically Signed   By: Randa Ngo M.D.   On: 11/25/2020 23:13   MR BRAIN WO CONTRAST  Result Date: 11/26/2020 CLINICAL DATA:  60 year old male with weakness. Altered mental status. Cough. EXAM: MRI HEAD WITHOUT CONTRAST TECHNIQUE: Multiplanar, multiecho pulse sequences of the brain and surrounding structures were obtained without intravenous contrast. COMPARISON:  Head CT 11/25/2020 and earlier.  Brain MRI 08/14/2008. FINDINGS: Brain: Study is  intermittently degraded by motion artifact despite repeated imaging attempts. Diffusion-weighted imaging is diagnostic and there is no restricted diffusion identified. No midline shift, mass effect, evidence of mass lesion, ventriculomegaly, extra-axial collection or acute intracranial hemorrhage. Cervicomedullary junction and pituitary are within normal limits. Largely normal for age gray and white matter signal throughout the brain. On SWI there are occasional chronic microhemorrhages in the left hemisphere (series 17, image 43). No cortical encephalomalacia identified. Deep gray nuclei, brainstem and cerebellum appear negative. Vascular: Major intracranial vascular flow voids are preserved. Skull and upper cervical spine: Stable visible cervical spine. Visualized bone marrow signal is within normal limits. Sinuses/Orbits: Negative orbits. Mild right maxillary and sphenoid sinus mucosal thickening is chronic. Other: Mastoids are well aerated. Grossly normal visible internal auditory structures. IMPRESSION: 1. Mildly motion degraded exam with no acute intracranial abnormality. 2. Largely negative for age noncontrast MRI appearance of the brain; occasional chronic micro-hemorrhages suspected in the left hemisphere. Electronically Signed   By: Genevie Ann M.D.   On: 11/26/2020 07:11   MR BRAIN W CONTRAST  Result Date: 11/26/2020 CLINICAL DATA:  Mental status changes. Recent systemic illness. Noncontrast MRI does not show any significant finding. EXAM: MRI HEAD WITH CONTRAST TECHNIQUE: Multiplanar, multiecho pulse sequences of the brain and surrounding structures were obtained with intravenous contrast. CONTRAST:  27mL GADAVIST GADOBUTROL 1 MMOL/ML IV SOLN COMPARISON:  Earlier same day FINDINGS: After contrast administration, there is no abnormal enhancement of the brain or leptomeninges. Major vascular structures show flow. Probable tiny insignificant venous angioma at the left frontoparietal vertex. IMPRESSION: No  abnormal enhancement. Electronically Signed   By: Nelson Chimes M.D.   On: 11/26/2020 10:01   DG Chest Portable 1 View  Result Date: 11/25/2020 CLINICAL DATA:  Fever and weakness for the past 3 weeks. Cough. Altered mental status for a few days. History of TB and diabetes. EXAM: PORTABLE CHEST 1 VIEW COMPARISON:  09/14/2017 FINDINGS: Slightly shallow inspiration. Heart size and pulmonary vascularity are normal. Emphysematous changes suggested in the lungs. No airspace disease or consolidation. No pleural effusions. No pneumothorax. Mediastinal contours appear intact. IMPRESSION: Emphysematous changes in the lungs. No evidence of active pulmonary disease. Electronically Signed   By: Lucienne Capers M.D.   On: 11/25/2020 22:37     Assessment/Plan: 60yo M with well controlled HIV disease on tivicay-symtuza, CD 4 count in the 800s. Now admitted for encephalopathy of unclear source  - recommend to get LP likely by IR. Recommend to start acyclovir to see if any improvement - please get cell count aerobic cultures, HSV PCR, VZV Ig G - will check SARS-CoV2 IgG -  recommend to get EEG to see if any signs for NCSE  transaminitis = recommend to check HCV Ab and HBsAg   HIV disease = if unable to take pills, would hold temporarily

## 2020-11-26 NOTE — Consult Note (Signed)
. Neurology Consultation  Reason for Consult: AMS Referring Physician: Harvest Forest, MD, [facility]  CC: non verbal, not active, and not eating x 3 days  History is obtained from: daughter  HPI: Eric Jefferson is a 60 y.o. male with a PMHx of HIV on HAART followed by Dr. [name], TB in 2009, CKD III, tobacco abuse, GERD, and HLD. Patient presented to [facility] ED today with 3 days of AMS, including lying around, not eating and not talking. Daughter provides history and serves as Veterinary surgeon as needed, but patient speaks some [place]. Per daughter, all household family members were sick with fever and cough about 2 weeks ago. COVID neg. Everyone healed except patient who required Benadryl and OTC cold meds. However, he progressed to not eating, talking, or ambulating for past 3 days. Daughter states he would just lie there with eyes closed and occasionally open his eyes, but mostly stared and smiled.    Per daughter he is normally independent, works at [employer], drives and can perform all of his own ADLs. She has never seen him like this before. She denies any hx of depression, schizophrenia, or bipolar disease.   Daughter is unsure how long patient has been HIV +. He has been in this [place] x 14 [place].   On today's exam, NP approached patient and he said hi. Daughter said that is the most he has done in 3 days.    ROS: A 14 point ROS was performed and is negative except as noted in the HPI.   Past Medical History:  Diagnosis Date  . Arthritis   . Diabetes ([place])    02-26-20 pt denies  . Headache   . HIV (human immunodeficiency virus infection) ([place])   . TB (pulmonary tuberculosis)    dx in 2009- took years worth of medication for this   History reviewed. No pertinent surgical history.  Current Outpatient Medications  Medication Instructions  . Darunavir-Cobicisctat-Emtricitabine-Tenofovir Alafenamide (SYMTUZA) 800-150-200-10 MG TABS 1 tablet, Oral, Daily with breakfast, Take with  Tivicay.  . famotidine (PEPCID) 40 mg, Oral, Daily  . Tivicay 50 mg, Oral, Daily, Take with Symtuza.    Family History  Problem Relation Age of Onset  . Healthy Mother   . Healthy Father   . Colon cancer Neg Hx   . Esophageal cancer Neg Hx   . Rectal cancer Neg Hx   . Stomach cancer Neg Hx      Social History:   reports that he has been smoking cigarettes. He has a 18.00 pack-year smoking history. He has never used smokeless tobacco. He reports that he does not drink alcohol and does not use drugs.   Medications  Current Facility-Administered Medications:  .  0.9 %  sodium chloride infusion, , Intravenous, Continuous, Smith, Rondell A, MD, Last Rate: 75 mL/hr at 11/26/20 0854, New Bag at 11/26/20 0854 .  albuterol (PROVENTIL) (2.5 MG/3ML) 0.083% nebulizer solution 2.5 mg, 2.5 mg, Nebulization, Q6H PRN, Smith, Rondell A, MD .  enoxaparin (LOVENOX) injection 40 mg, 40 mg, Subcutaneous, Q24H, Smith, Rondell A, MD, 40 mg at 11/26/20 0850 .  LORazepam (ATIVAN) injection 0.5-1 mg, 0.5-1 mg, Intravenous, Once PRN, Smith, Rondell A, MD .  ondansetron (ZOFRAN) tablet 4 mg, 4 mg, Oral, Q6H PRN **OR** ondansetron (ZOFRAN) injection 4 mg, 4 mg, Intravenous, Q6H PRN, Smith, Rondell A, MD .  sodium chloride flush (NS) 0.9 % injection 3 mL, 3 mL, Intravenous, Q12H, Norval Morton, MD  Current Outpatient Medications:  .  Darunavir-Cobicisctat-Emtricitabine-Tenofovir Alafenamide (SYMTUZA) 800-150-200-10 MG TABS, Take 1 tablet by mouth daily with breakfast. Take with Tivicay., Disp: 30 tablet, Rfl: 5 .  dolutegravir (TIVICAY) 50 MG tablet, Take 1 tablet (50 mg total) by mouth daily. Take with Symtuza., Disp: 30 tablet, Rfl: 5 .  famotidine (PEPCID) 40 MG tablet, Take 1 tablet (40 mg total) by mouth daily., Disp: 30 tablet, Rfl: 0   Exam: Current vital signs: BP (!) 118/95   Pulse 88   Temp 97.8 F (36.6 C) (Oral)   Resp (!) 21   SpO2 100%  Vital signs in last 24 hours: Temp:  [97.8 F  (36.6 C)] 97.8 F (36.6 C) (01/26 2215) Pulse Rate:  [76-97] 88 (01/27 0900) Resp:  [16-24] 21 (01/27 0900) BP: (102-172)/(82-95) 118/95 (01/27 0900) SpO2:  [96 %-100 %] 100 % (01/27 0900)    Daughter served as Veterinary surgeon.  GENERAL: Awake, alert in NAD HEENT: - Normocephalic and atraumatic, dry mm, no LN++, no Thyromegaly LUNGS - Normal respiratory effort.  CV - RRR on tele ABDOMEN - Soft, non tender MSK: tender to various joints. Olecranon deformity noted on left. No redness, edema or heat noted to joints. He grimaces with neck flexion and cspine palpation.  Ext: warm, well perfused  NEURO:  Mental Status: Awake, alert. If eyes are closed, he will open spontaneously. Says Hi and repeats "my name"? when NP asked his name.  Speech/Language: Non fluent.  Cranial Nerves:  II: PERRL 3 mm/brisk. visual fields full.  III, IV, VI: EOMI. Lid elevation symmetric and full.  V, VII: furls brow with stimulation. Blinks to threat. Smile is symmetrical.   VIII:hearing intact to voice IX, X: palate elevation is symmetric. Phonation normal.  XI: head is midline. Does not follow commands.  XII: tongue is symmetrical without fasciculations.   Motor: moves all extremities spontaneously. Unable to participate in strength exam.  Tone is normal. Bulk is normal.  Sensation- intact to touch in all extremities.  Coordination: no drift. No tremors.  Alternating hand movements.  DTRs: 3+ throughout.  Gait- deferred  Kernig's and Brudzinski's signs negative.   Labs I have reviewed labs in epic and the results pertinent to this consultation are:  CBC    Component Value Date/Time   WBC 5.5 11/25/2020 2234   RBC 5.36 11/25/2020 2234   HGB 15.5 11/25/2020 2234   HCT 45.8 11/25/2020 2234   PLT 162 11/25/2020 2234   MCV 85.4 11/25/2020 2234   MCH 28.9 11/25/2020 2234   MCHC 33.8 11/25/2020 2234   RDW 13.2 11/25/2020 2234   LYMPHSABS 3.9 07/07/2020 1430   MONOABS 0.8 07/07/2020 1430   EOSABS 0.3  07/07/2020 1430   BASOSABS 0.1 07/07/2020 1430    Other labs- Na 138, K 4.3, creat 1.00, Glucose 101, AST 79 ALT 57, CK 108, Vit B12 570, cryptogenic serum Ag negative, TSH 0.949, INR 1.0, aPTT 42, ammonia 16  Imaging CT head 1. No acute intracranial process. 2. Right maxillary and sphenoid sinusitis.  MRI brain-No abnormal enhancement.  Assessment: 60 yo male with hx of HIV on HAART with what sounds like a recent viral illness and COVID negative, who presents to ED with a 3 day hx of not eating, drinking, ambulating, or speaking. CT head and MRI neg. On exam he is mostly non verbal except for saying "hi" and repeating "my name". Neuro exam is non focal. Grimacing noted with palpation of Cspine and with neck flexion. VZV encephalitis/meningitis although Kernig's and Brudzinski's signs are negative.  LP attempted at bedside with consent of daughter, but patient would not lie still. Do not want to treat empirically as of yet, due to risks of treatment if no infection. He is afebrile and has no leukocytosis. Toxic metabolic encephalopathy is definitely a consideration. He also grimaces with palpation of most of his joints.   Impression: 1. AMS   Recommendations: -medicine admit -ID consulted by medicine for HIV hx -Will hold off on empiric treatment for now given no fever or leukocytosis, but should he spike fever, he will need Vancomycin, Ceftriaxone, Ampicillin, and Acyclovir. -LP under fluoroscopy with sedation. CSF studies have been ordered.  -Infectious and metabolic workup to eliminate other causes of AMS.  -TSH, Vit B1, RPR.  -For joint tenderness, check ANA, CK, CRP. No signs of Lyme's on MRI.   Pt seen by Clance Boll, NP/Neuro and later by MD. Note/plan to be edited by MD as needed.  Pager: [phone]  Attending Neurologist's note:  I personally saw this patient, gathering history, performing a full neurologic examination, reviewing relevant labs, personally reviewing  relevant imaging including MRI brain w/ and w/o contrast, and formulated the assessment and plan, adding the note above for completeness and clarity to accurately reflect my thoughts

## 2020-11-26 NOTE — ED Notes (Signed)
Patient transported to MRI 

## 2020-11-26 NOTE — Procedures (Addendum)
LUMBAR PUNCTURE (SPINAL TAP) PROCEDURE NOTE  Indication: Possibility of encephalitis/meningitis    Proceduralists: Dr Zeb Comfort  Time of procedure: 11/26/2020 1700   Risks of the procedure were dicussed with the patient including post-LP headache, bleeding, infection, weakness/numbness of legs(radiculopathy), death.    Consent obtained from: daughter Raelyn Mora   Procedure Note The patient was prepped and draped. However patient kept moving and unable to tolerate procedure   Lipa Knauff Barbra Sarks

## 2020-11-26 NOTE — Plan of Care (Signed)

## 2020-11-27 ENCOUNTER — Inpatient Hospital Stay (HOSPITAL_COMMUNITY): Payer: Self-pay

## 2020-11-27 ENCOUNTER — Other Ambulatory Visit (HOSPITAL_COMMUNITY): Payer: Medicaid Other

## 2020-11-27 ENCOUNTER — Other Ambulatory Visit: Payer: Self-pay

## 2020-11-27 DIAGNOSIS — Z720 Tobacco use: Secondary | ICD-10-CM

## 2020-11-27 LAB — CBC
HCT: 41 % (ref 39.0–52.0)
Hemoglobin: 14.2 g/dL (ref 13.0–17.0)
MCH: 29.3 pg (ref 26.0–34.0)
MCHC: 34.6 g/dL (ref 30.0–36.0)
MCV: 84.5 fL (ref 80.0–100.0)
Platelets: 184 10*3/uL (ref 150–400)
RBC: 4.85 MIL/uL (ref 4.22–5.81)
RDW: 13.2 % (ref 11.5–15.5)
WBC: 4.8 10*3/uL (ref 4.0–10.5)
nRBC: 0 % (ref 0.0–0.2)

## 2020-11-27 LAB — COMPREHENSIVE METABOLIC PANEL
ALT: 57 U/L — ABNORMAL HIGH (ref 0–44)
AST: 73 U/L — ABNORMAL HIGH (ref 15–41)
Albumin: 3.2 g/dL — ABNORMAL LOW (ref 3.5–5.0)
Alkaline Phosphatase: 70 U/L (ref 38–126)
Anion gap: 12 (ref 5–15)
BUN: 18 mg/dL (ref 8–23)
CO2: 22 mmol/L (ref 22–32)
Calcium: 8.6 mg/dL — ABNORMAL LOW (ref 8.9–10.3)
Chloride: 105 mmol/L (ref 98–111)
Creatinine, Ser: 1.01 mg/dL (ref 0.61–1.24)
GFR, Estimated: >60 mL/min (ref >60)
Glucose, Bld: 114 mg/dL — ABNORMAL HIGH (ref 70–99)
Potassium: 3.8 mmol/L (ref 3.5–5.1)
Sodium: 139 mmol/L (ref 135–145)
Total Bilirubin: 0.7 mg/dL (ref 0.3–1.2)
Total Protein: 6.5 g/dL (ref 6.5–8.1)

## 2020-11-27 LAB — CSF CELL COUNT WITH DIFFERENTIAL
Eosinophils, CSF: 0 % (ref 0–1)
Lymphs, CSF: 69 % (ref 40–80)
Monocyte-Macrophage-Spinal Fluid: 31 % (ref 15–45)
RBC Count, CSF: 1 /mm3 — ABNORMAL HIGH
RBC Count, CSF: 0 /mm3
Segmented Neutrophils-CSF: 0 % (ref 0–6)
Tube #: 3
Tube #: 4
WBC, CSF: 7 /mm3 — ABNORMAL HIGH (ref 0–5)
WBC, CSF: 14 /mm3 (ref 0–5)

## 2020-11-27 LAB — RPR: RPR Ser Ql: NONREACTIVE

## 2020-11-27 LAB — T-HELPER CELLS (CD4) COUNT (NOT AT ARMC)
CD4 % Helper T Cell: 6 % — ABNORMAL LOW (ref 33–65)
CD4 T Cell Abs: 131 /uL — ABNORMAL LOW (ref 400–1790)

## 2020-11-27 LAB — HEPATITIS PANEL, ACUTE
HCV Ab: NONREACTIVE
Hep A IgM: NONREACTIVE
Hep B C IgM: NONREACTIVE
Hepatitis B Surface Ag: NONREACTIVE

## 2020-11-27 LAB — CRYPTOCOCCAL ANTIGEN, CSF: Crypto Ag: NEGATIVE

## 2020-11-27 LAB — GLUCOSE, CSF: Glucose, CSF: 63 mg/dL (ref 40–70)

## 2020-11-27 LAB — PROTEIN, CSF: Total  Protein, CSF: 62 mg/dL — ABNORMAL HIGH (ref 15–45)

## 2020-11-27 MED ORDER — LORAZEPAM 2 MG/ML IJ SOLN
1.0000 mg | Freq: Once | INTRAMUSCULAR | Status: AC | PRN
  Administered 2020-11-27: 1 mg via INTRAVENOUS
  Filled 2020-11-27: qty 1

## 2020-11-27 MED ORDER — LACTATED RINGERS IV SOLN: INTRAVENOUS | Status: DC

## 2020-11-27 MED ORDER — LIDOCAINE HCL (PF) 1 % IJ SOLN
5.0000 mL | Freq: Once | INTRAMUSCULAR | Status: AC
  Administered 2020-11-27: 5 mL via INTRADERMAL

## 2020-11-27 MED ORDER — QUETIAPINE FUMARATE 25 MG PO TABS
25.0000 mg | ORAL_TABLET | Freq: Every day | ORAL | Status: DC
  Administered 2020-11-28 – 2020-11-29 (×2): 25 mg via ORAL
  Filled 2020-11-27 (×3): qty 1

## 2020-11-27 NOTE — Plan of Care (Signed)

## 2020-11-27 NOTE — Progress Notes (Signed)
EEG will come back , pt is going for LP .

## 2020-11-27 NOTE — Progress Notes (Signed)
EEG complete - results pending 

## 2020-11-27 NOTE — Progress Notes (Signed)
PROGRESS NOTE    Eric Jefferson  [id number] DOB: 08-27-1961 DOA: 11/25/2020 PCP: Eric Riches, MD   Chief Complain: Altered mental status  Brief Narrative: Patient is a 61 year old [name] speaking male with history of HIV on therapy , previous pulmonary TB, hyperlipidemia, CKD, tobacco abuse who presents with 3-day history of altered mental status. There was report of fever up to 125 at home along with headache, chills, nonproductive cough. On presentation he was afebrile. Blood pressure was elevated. CT imaging of the brain showed maxillary and sphenoid sinusitis. Liver enzymes were mild elevated. Covid screen test was negative. Chest x-ray showed emphysematous changes. MRI of the brain did not show any acute intracranial normalities. She denies infection suspected. Neurology and ID consulted. Started on acyclovir empirically. Underwent lumbar puncture by IR today.  Assessment & Plan:   Principal Problem:   Acute metabolic encephalopathy Active Problems:   Human immunodeficiency virus (HIV) disease (HCC)   TOBACCO ABUSE   Transaminitis   Altered mental status:Metabolic encephalopathy of unknown cause . Suspected to be from CNS infection. Started on acyclovir empirically. Neurology attempted lumbar puncture at bedside but was unsuccessful. Underwent  fluoroscopy guided LP.  CSF analysis showed WBC count of 7, elevated protein.  We will follow up other panel. Patient completely altered, not following commands, does not speak. There is report of headache, fever at home. Brain imagings did not show an acute intracranial abnormalities. UDS were negative. Continue gentle IV fluids. ID, neurology following He was agitated and trying to get out of the bed today.  Continue soft restraints.  Started on low-dose Seroquel. TSH,vitamin B12 normal  History of HIV: On tivicay and Symtuza. Follows with Eric Jefferson, ID.  CD4 of 131.  Transaminitis: Mild. Continue to monitor.Will check hepatitis  panel  History of pulmonary tuberculosis: No active issues  Tobacco use: Currently smoking..        DVT prophylaxis:SCD Code Status: Full Family Communication: Called and discussed with the daughter on phone on 11/27/2020 Status is: Inpatient  Remains inpatient appropriate because:Inpatient level of care appropriate due to severity of illness   Dispo: The patient is from: Home              Anticipated d/c is to: Home              Anticipated d/c date is: 3 days              Patient currently is not medically stable to d/c.   Difficult to place patient No      Consultants: Neurology, ID  Procedures:None  Antimicrobials:  Anti-infectives (From admission, onward)   Start     Dose/Rate Route Frequency Ordered Stop   11/26/20 2030  acyclovir (ZOVIRAX) 500 mg in dextrose 5 % 100 mL IVPB        500 mg 110 mL/hr over 60 Minutes Intravenous Every 8 hours 11/26/20 1917        Subjective:  Patient seen and examined the bedside this morning.  Hemodynamically stable during my evaluation.  Trying to get out of the bed, agitated.  Confused Objective: Vitals:   11/26/20 2000 11/26/20 2030 11/27/20 0030 11/27/20 0341  BP:  109/81 124/83 100/63  Pulse:   86 84  Resp:  18 18 18   Temp:  (!) 97.5 F (36.4 C) 98.3 F (36.8 C) 98.6 F (37 C)  TempSrc:  Axillary Oral Oral  SpO2:  97% 100% 99%  Weight:      Height: 5\' 3"  (1.6  m)       Intake/Output Summary (Last 24 hours) at 11/27/2020 0823 Last data filed at 11/27/2020 0606 Gross per 24 hour  Intake 682.11 ml  Output 300 ml  Net 382.11 ml   Filed Weights   11/26/20 1900  Weight: 54 kg    Examination:  General exam: Agitated, restless HEENT:PERRL,Oral mucosa moist, Ear/Nose normal on gross exam Respiratory system: Bilateral equal air entry, normal vesicular breath sounds, no wheezes or crackles  Cardiovascular system: S1 & S2 heard, RRR. No JVD, murmurs, rubs, gallops or clicks. No pedal edema. Gastrointestinal  system: Abdomen is nondistended, soft and nontender. No organomegaly or masses felt. Normal bowel sounds heard. Central nervous system: Alert and awake but not oriented, no focal neurological deficits, no neck rigidity Extremities: No edema, no clubbing ,no cyanosis Skin: No rashes, lesions or ulcers,no icterus ,no pallor   Data Reviewed: I have personally reviewed following labs and imaging studies  CBC: Recent Labs  Lab 11/25/20 2234 11/27/20 0731  WBC 5.5 4.8  HGB 15.5 14.2  HCT 45.8 41.0  MCV 85.4 84.5  PLT 162 812   Basic Metabolic Panel: Recent Labs  Lab 11/25/20 2234  NA 138  K 4.3  CL 106  CO2 21*  GLUCOSE 101*  BUN 17  CREATININE 1.00  CALCIUM 8.7*   GFR: Estimated Creatinine Clearance: 59.3 mL/min (by C-G formula based on SCr of 1 mg/dL). Liver Function Tests: Recent Labs  Lab 11/26/20 0700  AST 79*  ALT 57*  ALKPHOS 71  BILITOT 0.9  PROT 6.7  ALBUMIN 3.4*   No results for input(s): LIPASE, AMYLASE in the last 168 hours. Recent Labs  Lab 11/26/20 0700  AMMONIA 16   Coagulation Profile: Recent Labs  Lab 11/26/20 1440  INR 1.0   Cardiac Enzymes: Recent Labs  Lab 11/26/20 0700  CKTOTAL 108   BNP (last 3 results) No results for input(s): PROBNP in the last 8760 hours. HbA1C: No results for input(s): HGBA1C in the last 72 hours. CBG: Recent Labs  Lab 11/26/20 0754  GLUCAP 87   Lipid Profile: No results for input(s): CHOL, HDL, LDLCALC, TRIG, CHOLHDL, LDLDIRECT in the last 72 hours. Thyroid Function Tests: Recent Labs    11/26/20 1107  TSH 0.948   Anemia Panel: Recent Labs    11/26/20 1107  VITAMINB12 570   Sepsis Labs: Recent Labs  Lab 11/25/20 2234 11/26/20 0700  LATICACIDVEN 1.1 1.5    Recent Results (from the past 240 hour(s))  SARS Coronavirus 2 by RT PCR (hospital order, performed in [facility] hospital lab) Nasopharyngeal Nasopharyngeal Swab     Status: None   Collection Time: 11/25/20 11:26 PM   Specimen:  Nasopharyngeal Swab  Result Value Ref Range Status   SARS Coronavirus 2 NEGATIVE NEGATIVE Final    Comment: (NOTE) SARS-CoV-2 target nucleic acids are NOT DETECTED.  The SARS-CoV-2 RNA is generally detectable in upper and lower respiratory specimens during the acute phase of infection. The lowest concentration of SARS-CoV-2 viral copies this assay can detect is 250 copies / mL. A negative result does not preclude SARS-CoV-2 infection and should not be used as the sole basis for treatment or other patient management decisions.  A negative result may occur with improper specimen collection / handling, submission of specimen other than nasopharyngeal swab, presence of viral mutation(s) within the areas targeted by this assay, and inadequate number of viral copies (<250 copies / mL). A negative result must be combined with clinical observations, patient history,  and epidemiological information.  Fact Sheet for Patients:   StrictlyIdeas.no  Fact Sheet for Healthcare Providers: BankingDealers.co.za  This test is not yet approved or  cleared by the [place] FDA and has been authorized for detection and/or diagnosis of SARS-CoV-2 by FDA under an Emergency Use Authorization (EUA).  This EUA will remain in effect (meaning this test can be used) for the duration of the COVID-19 declaration under Section 564(b)(1) of the Act, 21 U.S.C. section 360bbb-3(b)(1), unless the authorization is terminated or revoked sooner.  Performed at [facility], [place] [address] [place], [place] [address]   Respiratory (~20 pathogens) panel by PCR     Status: None   Collection Time: 11/26/20  2:17 PM   Specimen: Nasopharyngeal Swab; Respiratory  Result Value Ref Range Status   Adenovirus NOT DETECTED NOT DETECTED Final   Coronavirus 229E NOT DETECTED NOT DETECTED Final    Comment: (NOTE) The Coronavirus on the Respiratory Panel, DOES NOT test for the  novel  Coronavirus (2019 nCoV)    Coronavirus HKU1 NOT DETECTED NOT DETECTED Final   Coronavirus NL63 NOT DETECTED NOT DETECTED Final   Coronavirus OC43 NOT DETECTED NOT DETECTED Final   Metapneumovirus NOT DETECTED NOT DETECTED Final   Rhinovirus / Enterovirus NOT DETECTED NOT DETECTED Final   Influenza A NOT DETECTED NOT DETECTED Final   Influenza B NOT DETECTED NOT DETECTED Final   Parainfluenza Virus 1 NOT DETECTED NOT DETECTED Final   Parainfluenza Virus 2 NOT DETECTED NOT DETECTED Final   Parainfluenza Virus 3 NOT DETECTED NOT DETECTED Final   Parainfluenza Virus 4 NOT DETECTED NOT DETECTED Final   Respiratory Syncytial Virus NOT DETECTED NOT DETECTED Final   Bordetella pertussis NOT DETECTED NOT DETECTED Final   Bordetella Parapertussis NOT DETECTED NOT DETECTED Final   Chlamydophila pneumoniae NOT DETECTED NOT DETECTED Final   Mycoplasma pneumoniae NOT DETECTED NOT DETECTED Final    Comment: Performed at [facility], [place]. [address] [place], [place] [address]         Radiology Studies: CT HEAD WO CONTRAST  Result Date: 11/25/2020 CLINICAL DATA:  Weakness, altered level of consciousness for 3 days, nonverbal EXAM: CT HEAD WITHOUT CONTRAST TECHNIQUE: Contiguous axial images were obtained from the base of the skull through the vertex without intravenous contrast. COMPARISON:  06/17/2016 FINDINGS: Brain: No acute infarct or hemorrhage. Lateral ventricles and midline structures are unremarkable. No acute extra-axial fluid collections. No mass effect. Vascular: No hyperdense vessel or unexpected calcification. Skull: Normal. Negative for fracture or focal lesion. Sinuses/Orbits: Small gas fluid levels within the right maxillary and sphenoid sinuses. Mucoperiosteal thickening throughout the ethmoid air cells. Other: None. IMPRESSION: 1. No acute intracranial process. 2. Right maxillary and sphenoid sinusitis. Electronically Signed   By: Randa Ngo M.D.   On: 11/25/2020 23:13    MR BRAIN WO CONTRAST  Result Date: 11/26/2020 CLINICAL DATA:  60 year old male with weakness. Altered mental status. Cough. EXAM: MRI HEAD WITHOUT CONTRAST TECHNIQUE: Multiplanar, multiecho pulse sequences of the brain and surrounding structures were obtained without intravenous contrast. COMPARISON:  Head CT 11/25/2020 and earlier.  Brain MRI 08/14/2008. FINDINGS: Brain: Study is intermittently degraded by motion artifact despite repeated imaging attempts. Diffusion-weighted imaging is diagnostic and there is no restricted diffusion identified. No midline shift, mass effect, evidence of mass lesion, ventriculomegaly, extra-axial collection or acute intracranial hemorrhage. Cervicomedullary junction and pituitary are within normal limits. Largely normal for age gray and white matter signal throughout the brain. On SWI there  are occasional chronic microhemorrhages in the left hemisphere (series 17, image 43). No cortical encephalomalacia identified. Deep gray nuclei, brainstem and cerebellum appear negative. Vascular: Major intracranial vascular flow voids are preserved. Skull and upper cervical spine: Stable visible cervical spine. Visualized bone marrow signal is within normal limits. Sinuses/Orbits: Negative orbits. Mild right maxillary and sphenoid sinus mucosal thickening is chronic. Other: Mastoids are well aerated. Grossly normal visible internal auditory structures. IMPRESSION: 1. Mildly motion degraded exam with no acute intracranial abnormality. 2. Largely negative for age noncontrast MRI appearance of the brain; occasional chronic micro-hemorrhages suspected in the left hemisphere. Electronically Signed   By: Genevie Ann M.D.   On: 11/26/2020 07:11   MR BRAIN W CONTRAST  Result Date: 11/26/2020 CLINICAL DATA:  Mental status changes. Recent systemic illness. Noncontrast MRI does not show any significant finding. EXAM: MRI HEAD WITH CONTRAST TECHNIQUE: Multiplanar, multiecho pulse sequences of the  brain and surrounding structures were obtained with intravenous contrast. CONTRAST:  78mL GADAVIST GADOBUTROL 1 MMOL/ML IV SOLN COMPARISON:  Earlier same day FINDINGS: After contrast administration, there is no abnormal enhancement of the brain or leptomeninges. Major vascular structures show flow. Probable tiny insignificant venous angioma at the left frontoparietal vertex. IMPRESSION: No abnormal enhancement. Electronically Signed   By: Nelson Chimes M.D.   On: 11/26/2020 10:01   DG Chest Portable 1 View  Result Date: 11/25/2020 CLINICAL DATA:  Fever and weakness for the past 3 weeks. Cough. Altered mental status for a few days. History of TB and diabetes. EXAM: PORTABLE CHEST 1 VIEW COMPARISON:  09/14/2017 FINDINGS: Slightly shallow inspiration. Heart size and pulmonary vascularity are normal. Emphysematous changes suggested in the lungs. No airspace disease or consolidation. No pleural effusions. No pneumothorax. Mediastinal contours appear intact. IMPRESSION: Emphysematous changes in the lungs. No evidence of active pulmonary disease. Electronically Signed   By: Lucienne Capers M.D.   On: 11/25/2020 22:37        Scheduled Meds: . enoxaparin (LOVENOX) injection  40 mg Subcutaneous Q24H  . sodium chloride flush  3 mL Intravenous Q12H   Continuous Infusions: . sodium chloride 75 mL/hr at 11/27/20 0310  . acyclovir 500 mg (11/27/20 0627)     LOS: 1 day    Time spent: More than 50% of that time was spent in counseling and/or coordination of care.      Shelly Coss, MD [facility] P1/28/2022, 8:23 AM

## 2020-11-27 NOTE — Progress Notes (Addendum)
[facility]    Date of Admission:  11/25/2020   Total days of antibiotics 2/acyclovir   ID: Geovanie Winnett is a 60 y.o. male with   Principal Problem:   Acute metabolic encephalopathy Active Problems:   Human immunodeficiency virus (HIV) disease ([place])   TOBACCO ABUSE   Transaminitis    Subjective: More alert this afternoon. He recognizes his son and is able to tell us his name. Still cognitive slowing not at baseline Underwent LP by IR this morning without difficulty  Medications:  . QUEtiapine  25 mg Oral QHS  . sodium chloride flush  3 mL Intravenous Q12H    Objective: Vital signs in last 24 hours: Temp:  [97.5 F (36.4 C)-98.8 F (37.1 C)] 98.8 F (37.1 C) (01/28 1309) Pulse Rate:  [80-86] 80 (01/28 1309) Resp:  [17-18] 17 (01/28 1309) BP: (100-124)/(63-83) 122/83 (01/28 1309) SpO2:  [97 %-100 %] 98 % (01/28 1309) Weight:  [54 kg] 54 kg (01/27 1900) Physical Exam  Constitutional: He opens eyes to verbal stimuli. He appears well-developed and well-nourished. No distress.  HENT:  Mouth/Throat: Oropharynx is clear and moist. No oropharyngeal exudate.  Cardiovascular: Normal rate, regular rhythm and normal heart sounds. Exam reveals no gallop and no friction rub.  No murmur heard.  Pulmonary/Chest: Effort normal and breath sounds normal. No respiratory distress. He has no wheezes.  Abdominal: Soft. Bowel sounds are normal. He exhibits no distension. There is no tenderness.  Lymphadenopathy:  He has no cervical adenopathy.  Neurological: He is moving all extremities. Follows some commands but cognitive slowing Skin: Skin is warm and dry. No rash noted. No erythema.  Psychiatric: flat affect    Lab Results Recent Labs    11/25/20 2234 11/27/20 0731  WBC 5.5 4.8  HGB 15.5 14.2  HCT 45.8 41.0  NA 138 139  K 4.3 3.8  CL 106 105  CO2 21* 22  BUN 17 18  CREATININE 1.00 1.01   Liver Panel Recent Labs    11/26/20 0700 11/27/20 0731   PROT 6.7 6.5  ALBUMIN 3.4* 3.2*  AST 79* 73*  ALT 57* 57*  ALKPHOS 71 70  BILITOT 0.9 0.7  BILIDIR 0.3*  --   IBILI 0.6  --    Cd 4 count of < 200 Microbiology: reviewed Studies/Results: EEG  Result Date: 11/27/2020 Lora Havens, MD     11/27/2020  4:47 PM Patient Name: [name] MRN: [id number] Epilepsy Attending: Lora Havens Referring Physician/Provider: Dr Lesleigh Noe Date: 11/27/2020 Duration: 23.42 mins Patient history: 59yo M with ams. EEG to evaluate for seizure Level of alertness: Awake AEDs during EEG study: None Technical aspects: This EEG study was done with scalp electrodes positioned according to the 10-20 International system of electrode placement. Electrical activity was acquired at a sampling rate of 500Hz  and reviewed with a high frequency filter of 70Hz  and a low frequency filter of 1Hz . EEG data were recorded continuously and digitally stored. Description: The posterior dominant rhythm consists of 8 Hz activity of moderate voltage (25-35 uV) seen predominantly in posterior head regions, symmetric and reactive to eye opening and eye closing. Hyperventilation and photic stimulation were not performed.   IMPRESSION: This study is within normal limits. No seizures or epileptiform discharges were seen throughout the recording. Lora Havens   CT HEAD WO CONTRAST  Result Date: 11/25/2020 CLINICAL DATA:  Weakness, altered level of consciousness for 3 days, nonverbal EXAM: CT HEAD WITHOUT CONTRAST TECHNIQUE:  Contiguous axial images were obtained from the base of the skull through the vertex without intravenous contrast. COMPARISON:  06/17/2016 FINDINGS: Brain: No acute infarct or hemorrhage. Lateral ventricles and midline structures are unremarkable. No acute extra-axial fluid collections. No mass effect. Vascular: No hyperdense vessel or unexpected calcification. Skull: Normal. Negative for fracture or focal lesion. Sinuses/Orbits: Small gas fluid levels within the right  maxillary and sphenoid sinuses. Mucoperiosteal thickening throughout the ethmoid air cells. Other: None. IMPRESSION: 1. No acute intracranial process. 2. Right maxillary and sphenoid sinusitis. Electronically Signed   By: Randa Ngo M.D.   On: 11/25/2020 23:13   MR BRAIN WO CONTRAST  Result Date: 11/26/2020 CLINICAL DATA:  60 year old male with weakness. Altered mental status. Cough. EXAM: MRI HEAD WITHOUT CONTRAST TECHNIQUE: Multiplanar, multiecho pulse sequences of the brain and surrounding structures were obtained without intravenous contrast. COMPARISON:  Head CT 11/25/2020 and earlier.  Brain MRI 08/14/2008. FINDINGS: Brain: Study is intermittently degraded by motion artifact despite repeated imaging attempts. Diffusion-weighted imaging is diagnostic and there is no restricted diffusion identified. No midline shift, mass effect, evidence of mass lesion, ventriculomegaly, extra-axial collection or acute intracranial hemorrhage. Cervicomedullary junction and pituitary are within normal limits. Largely normal for age gray and white matter signal throughout the brain. On SWI there are occasional chronic microhemorrhages in the left hemisphere (series 17, image 43). No cortical encephalomalacia identified. Deep gray nuclei, brainstem and cerebellum appear negative. Vascular: Major intracranial vascular flow voids are preserved. Skull and upper cervical spine: Stable visible cervical spine. Visualized bone marrow signal is within normal limits. Sinuses/Orbits: Negative orbits. Mild right maxillary and sphenoid sinus mucosal thickening is chronic. Other: Mastoids are well aerated. Grossly normal visible internal auditory structures. IMPRESSION: 1. Mildly motion degraded exam with no acute intracranial abnormality. 2. Largely negative for age noncontrast MRI appearance of the brain; occasional chronic micro-hemorrhages suspected in the left hemisphere. Electronically Signed   By: Genevie Ann M.D.   On: 11/26/2020  07:11   MR BRAIN W CONTRAST  Result Date: 11/26/2020 CLINICAL DATA:  Mental status changes. Recent systemic illness. Noncontrast MRI does not show any significant finding. EXAM: MRI HEAD WITH CONTRAST TECHNIQUE: Multiplanar, multiecho pulse sequences of the brain and surrounding structures were obtained with intravenous contrast. CONTRAST:  87mL GADAVIST GADOBUTROL 1 MMOL/ML IV SOLN COMPARISON:  Earlier same day FINDINGS: After contrast administration, there is no abnormal enhancement of the brain or leptomeninges. Major vascular structures show flow. Probable tiny insignificant venous angioma at the left frontoparietal vertex. IMPRESSION: No abnormal enhancement. Electronically Signed   By: Nelson Chimes M.D.   On: 11/26/2020 10:01   DG Chest Portable 1 View  Result Date: 11/25/2020 CLINICAL DATA:  Fever and weakness for the past 3 weeks. Cough. Altered mental status for a few days. History of TB and diabetes. EXAM: PORTABLE CHEST 1 VIEW COMPARISON:  09/14/2017 FINDINGS: Slightly shallow inspiration. Heart size and pulmonary vascularity are normal. Emphysematous changes suggested in the lungs. No airspace disease or consolidation. No pleural effusions. No pneumothorax. Mediastinal contours appear intact. IMPRESSION: Emphysematous changes in the lungs. No evidence of active pulmonary disease. Electronically Signed   By: Lucienne Capers M.D.   On: 11/25/2020 22:37   DG FL GUIDED LUMBAR PUNCTURE  Result Date: 11/27/2020 Macy Mis, MD     11/27/2020  9:26 AM Lumbar Puncture Procedure Note Patryck Kilgore [id number] [birth year] Date:11/27/20 Time:9:25 AM Provider Performing:Praneil Posey Pronto Procedure: Fluoroscopic Guided Lumbar Puncture Consent Risks of the procedure as well as the alternatives and  risks of each were explained to the patient and/or caregiver.  Consent for the procedure was obtained and is signed in the bedside chart Anesthesia Topical only with 1% lidocaine Time Out Verified patient identification,  verified procedure, site/side was marked, verified correct patient position, special equipment/implants available, medications/allergies/relevant history reviewed, required imaging and test results available. Sterile Technique Maximal sterile technique including sterile barrier drape, hand hygiene, sterile gown, sterile gloves, mask, hair covering. Procedure Description Using palpation, approximate location of L4-L5 space identified.   Lidocaine used to anesthetize skin and subcutaneous tissue overlying this area.  A 22g spinal needle was then used to access the subarachnoid space. CSF obtained. Complications/Tolerance None; patient tolerated the procedure well. EBL Minimal Specimen(s) CSF     Assessment/Plan: HIV disease= once he is able to take in food regularly can restart tivicay and prezcobix. Low CD 4 count in the acute infection can be seen and don't necessarily represent his baseline CD 4 count. Will check serum HIV VL  Encephalopathy = will add VDRL, cryptococcal antigen to CSF sample. Will continue with acyclovir until HSV pcr returns. VZV Ig G still pending. 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 LP no significant pleocytosis. Having slight improvement today. Unclear if related to starting antivirals.   Dr Johnny Bridge VU to see tomorrow for further recommendations.    [facility] Cell: [phone] Pager: [phone]  11/27/2020, 4:55 PM

## 2020-11-27 NOTE — Procedures (Signed)
Lumbar Puncture Procedure Note  Albino Bufford  [id number]  03-01-1961  Date:11/27/20  Time:9:25 AM   Provider Performing:Darric Plante Posey Pronto   Procedure: Fluoroscopic Guided Lumbar Puncture   Consent Risks of the procedure as well as the alternatives and risks of each were explained to the patient and/or caregiver.  Consent for the procedure was obtained and is signed in the bedside chart  Anesthesia Topical only with 1% lidocaine    Time Out Verified patient identification, verified procedure, site/side was marked, verified correct patient position, special equipment/implants available, medications/allergies/relevant history reviewed, required imaging and test results available.   Sterile Technique Maximal sterile technique including sterile barrier drape, hand hygiene, sterile gown, sterile gloves, mask, hair covering.    Procedure Description Using palpation, approximate location of L4-L5 space identified.   Lidocaine used to anesthetize skin and subcutaneous tissue overlying this area.  A 22g spinal needle was then used to access the subarachnoid space. CSF obtained.  Complications/Tolerance None; patient tolerated the procedure well.   EBL Minimal   Specimen(s) CSF

## 2020-11-27 NOTE — Procedures (Signed)
Patient Name: Eric Jefferson  MRN: [id number]  Epilepsy Attending: Lora Havens  Referring Physician/Provider: Dr Lesleigh Noe Date: 11/27/2020 Duration: 23.42 mins  Patient history: 59yo M with ams. EEG to evaluate for seizure  Level of alertness: Awake  AEDs during EEG study: None  Technical aspects: This EEG study was done with scalp electrodes positioned according to the 10-20 International system of electrode placement. Electrical activity was acquired at a sampling rate of 500Hz  and reviewed with a high frequency filter of 70Hz  and a low frequency filter of 1Hz . EEG data were recorded continuously and digitally stored.   Description: The posterior dominant rhythm consists of 8 Hz activity of moderate voltage (25-35 uV) seen predominantly in posterior head regions, symmetric and reactive to eye opening and eye closing. Hyperventilation and photic stimulation were not performed.     IMPRESSION: This study is within normal limits. No seizures or epileptiform discharges were seen throughout the recording.  Jamile Rekowski Barbra Sarks

## 2020-11-27 NOTE — Progress Notes (Addendum)
Neurology Progress Note  HPI- Eric Jefferson is a 60 y.o. male with a PMHx of HIV on HAART, TB in 2009, CKD III, tobacco abuse, GERD, and HLD. Patient presented to [facility] ED today with 3 days of AMS, including lying around, not eating and not talking.  Per daughter, all household family members were sick with fever and cough about 2 weeks ago. COVID neg. Everyone healed except patient who required Benadryl and OTC cold meds. However, he progressed to not eating, talking, or ambulating for past 3 days. Daughter states he would just lie there with eyes closed and occasionally open his eyes, but mostly stared and smiled. Per daughter he is normally independent, works at [employer], drives and can perform all of his own ADLs. She has never seen him like this before. She denies any hx of depression, schizophrenia, or bipolar disease.  Per daughter he is normally independent, works at [employer], drives and can perform all of his own ADLs. She has never seen him like this before. She denies any hx of depression, schizophrenia, or bipolar disease.  Called daughter to ask about Left extremity deformity. She states he has been having difficulty in moving his left arm. She denies any h/o recent trauma.  S:// Pt is not answering questions. Not able to tell his name, or where he is.  Not able to follow all commands.   O:// Current vital signs: BP 100/63 (BP Location: Right Arm)   Pulse 84   Temp 98.6 F (37 C) (Oral)   Resp 18   Ht 5\' 3"  (1.6 m)   Wt 54 kg   SpO2 99%   BMI 21.09 kg/m  Vital signs in last 24 hours: Temp:  [97.5 F (36.4 C)-98.7 F (37.1 C)] 98.6 F (37 C) (01/28 0341) Pulse Rate:  [79-86] 84 (01/28 0341) Resp:  [15-18] 18 (01/28 0341) BP: (100-124)/(63-86) 100/63 (01/28 0341) SpO2:  [97 %-100 %] 99 % (01/28 0341) Weight:  [54 kg] 54 kg (01/27 1900) GENERAL: Awake, alert in NAD.  HEENT: - Normocephalic and atraumatic, dry mucous membranes LUNGS - Symmetrical chest rise, No labored  breathing noted CV - no JVD, No Peripheral Edema ABDOMEN - Soft,  nondistended  Ext: warm, well perfused, intact peripheral pulses, Olecranon deformity noted on left.  No redness or swelling.  NEURO:  Mental Status: AA& Not oriented to self, age, time, place or situation. Poor Attention and Concentration Language: speech is mute.   Cranial Nerves: PERRL, Moves Eyes to left and right. Not able to move in vertical position, Couldn't test visual fields due to poor mentation but blinks to threat B/l, no facial asymmetry, hearing intact to voice. head is midline. Does not follow commands.  Motor: Moves all 4 extremities. Unable to follow commands for strength testing. Kernig's and Brudzinski's signs are negative. DTR's 3+ Brisk throughout.  Tone: is notable for paratonia and bulk is normal.  Sensation- Intact to light touch bilaterally.  Coordination: Not able to follow commands. Gait- deferred   Medications  Current Facility-Administered Medications:  .  0.9 %  sodium chloride infusion, , Intravenous, Continuous, Smith, Rondell A, MD, Last Rate: 75 mL/hr at 11/27/20 0310, New Bag at 11/27/20 0310 .  acyclovir (ZOVIRAX) 500 mg in dextrose 5 % 100 mL IVPB, 500 mg, Intravenous, Q8H, Tyrone Apple, RPH, Last Rate: 110 mL/hr at 11/27/20 0627, 500 mg at 11/27/20 3295 .  albuterol (PROVENTIL) (2.5 MG/3ML) 0.083% nebulizer solution 2.5 mg, 2.5 mg, Nebulization, Q6H PRN, Tamala Julian, Rondell  A, MD .  LORazepam (ATIVAN) injection 0.5-1 mg, 0.5-1 mg, Intravenous, Once PRN, Tamala Julian, Rondell A, MD .  ondansetron (ZOFRAN) tablet 4 mg, 4 mg, Oral, Q6H PRN **OR** ondansetron (ZOFRAN) injection 4 mg, 4 mg, Intravenous, Q6H PRN, Tamala Julian, Rondell A, MD .  QUEtiapine (SEROQUEL) tablet 25 mg, 25 mg, Oral, QHS, Adhikari, Amrit, MD .  sodium chloride flush (NS) 0.9 % injection 3 mL, 3 mL, Intravenous, Q12H, Smith, Rondell A, MD, 3 mL at 11/27/20 0835 Labs CBC    Component Value Date/Time   WBC 4.8 11/27/2020 0731   RBC  4.85 11/27/2020 0731   HGB 14.2 11/27/2020 0731   HCT 41.0 11/27/2020 0731   PLT 184 11/27/2020 0731   MCV 84.5 11/27/2020 0731   MCH 29.3 11/27/2020 0731   MCHC 34.6 11/27/2020 0731   RDW 13.2 11/27/2020 0731   LYMPHSABS 3.9 07/07/2020 1430   MONOABS 0.8 07/07/2020 1430   EOSABS 0.3 07/07/2020 1430   BASOSABS 0.1 07/07/2020 1430    CMP     Component Value Date/Time   NA 139 11/27/2020 0731   K 3.8 11/27/2020 0731   CL 105 11/27/2020 0731   CO2 22 11/27/2020 0731   GLUCOSE 114 (H) 11/27/2020 0731   BUN 18 11/27/2020 0731   CREATININE 1.01 11/27/2020 0731   CREATININE 1.38 (H) 04/09/2020 1521   CALCIUM 8.6 (L) 11/27/2020 0731   PROT 6.5 11/27/2020 0731   ALBUMIN 3.2 (L) 11/27/2020 0731   AST 73 (H) 11/27/2020 0731   ALT 57 (H) 11/27/2020 0731   ALKPHOS 70 11/27/2020 0731   BILITOT 0.7 11/27/2020 0731   GFRNONAA >60 11/27/2020 0731   GFRNONAA 72 01/15/2018 1020   GFRAA >60 07/07/2020 1430   GFRAA 83 01/15/2018 1020    Lipid Panel     Component Value Date/Time   CHOL 253 (H) 04/09/2020 1521   TRIG 164 (H) 04/09/2020 1521   HDL 50 04/09/2020 1521   CHOLHDL 5.1 (H) 04/09/2020 1521   VLDL 40 (H) 02/06/2017 1557   LDLCALC 172 (H) 04/09/2020 1521     Imaging I have reviewed images in epic and the results pertinent to this consultation are:  CT-scan of the brain 1. No acute intracranial process. 2. Right maxillary and sphenoid sinusitis.  MRI examination of the brain No abnormal enhancement.  Assessment: Pt is a 60 yo male with hx of HIV on HAART with what sounds like a recent viral illness and COVID negative, who presents to ED with a 3 day hx of not eating, drinking, ambulating, or speaking. CT head and MRI neg. On exam today, he is awake and alert but not oriented, didn't speak anything, not following any commands. Kernig's and Brudzinski's signs are negative. Pt was started on Acyclovir yesterday. Creatinine 1.01, LFT's mildly elevated. TSH 0.948, Vitamin B12  570, CD4T Cell Abs131, CD4 % Helper T cell 6 significantly decreased from 04/09/20 (CD4 T cell Abs 863, CD 4% Helper T cell 23). Blood cryptococcal antigen negative. Urine negative for infection. LP was performed today under fluoroscopy and CSF labs were sent.  Initial CSF studies shows increased protein 62, positive for wbc predominantly mononuclear 3. Pt's altered mental status can be due to multiple causes DDx includes CNS infection, metabolic encephalopathy.  Recommendations: - EEG - RPR  - Continue Acyclovir every 8 hours (started on 11/26/20 ) -Treatment of HIV by primary.  - Continue Seroquel 25 mg daily at bedtime for agitation. - Continue IV fluids  - Will F/U on  CSF cultures. - Appreciate ID on board - Neurology will follow.   Dr. Armando Reichert MD PGY1 [facility] Neurology  Attending Neurologist's note:  I personally saw this patient, gathering history, performing a full neurologic examination, reviewing relevant labs, and formulated the assessment and plan, adding the note above for completeness and clarity to accurately reflect my thoughts, with some additional thoughts below  Patient's neurological examination is stable for me today.  He remains minimally verbal, does say some unintelligible words, but otherwise is moving all 4 extremities equally and has no cranial nerve abnormalities.  On general examination he does not wince as much to palpation of his joints.  He does not have C-spine tenderness today, but also does not jump when his elbows and knees are touched etc.  Additional pertinent data resulted: RPR negative TSH within normal limits B12 570 CK 108 Ethanol level on admission undetectable Serum cryptococcal antigen negative  CSF studies notable for WBC 7/14, RBC 1/0, protein 6 2, glucose 63 (serum glucose 114) EEG within normal limits, no evidence of encephalopathy  Mild CSF pleocytosis seen is within normal limits for patients with HIV infection, however it is  prudent to await CSF viral PCR/IgG studies to result negative prior to discontinuing antiviral treatment.  It is interesting that the EEG is normal despite the patient's encephalopathic behavior.  In this setting, if patient does not improve over the next few days and infectious work-up continues to be unrevealing, psychiatry consult may be useful   Lesleigh Noe MD-PhD [facility] [phone]

## 2020-11-27 NOTE — Progress Notes (Signed)
eeg will re-attempt in the afternoon.

## 2020-11-28 DIAGNOSIS — R4182 Altered mental status, unspecified: Secondary | ICD-10-CM

## 2020-11-28 DIAGNOSIS — R531 Weakness: Secondary | ICD-10-CM

## 2020-11-28 LAB — COMPREHENSIVE METABOLIC PANEL
ALT: 53 U/L — ABNORMAL HIGH (ref 0–44)
AST: 61 U/L — ABNORMAL HIGH (ref 15–41)
Albumin: 3.3 g/dL — ABNORMAL LOW (ref 3.5–5.0)
Alkaline Phosphatase: 65 U/L (ref 38–126)
Anion gap: 11 (ref 5–15)
BUN: 13 mg/dL (ref 8–23)
CO2: 21 mmol/L — ABNORMAL LOW (ref 22–32)
Calcium: 8.5 mg/dL — ABNORMAL LOW (ref 8.9–10.3)
Chloride: 106 mmol/L (ref 98–111)
Creatinine, Ser: 0.96 mg/dL (ref 0.61–1.24)
GFR, Estimated: >60 mL/min (ref >60)
Glucose, Bld: 90 mg/dL (ref 70–99)
Potassium: 3.7 mmol/L (ref 3.5–5.1)
Sodium: 138 mmol/L (ref 135–145)
Total Bilirubin: 0.9 mg/dL (ref 0.3–1.2)
Total Protein: 6.7 g/dL (ref 6.5–8.1)

## 2020-11-28 MED ORDER — KETOROLAC TROMETHAMINE 30 MG/ML IJ SOLN: 30.0000 mg | Freq: Four times a day (QID) | INTRAMUSCULAR | Status: DC | PRN

## 2020-11-28 MED ORDER — LORAZEPAM 2 MG/ML IJ SOLN
1.0000 mg | Freq: Once | INTRAMUSCULAR | Status: AC | PRN
  Administered 2020-11-29: 1 mg via INTRAVENOUS
  Filled 2020-11-28: qty 1

## 2020-11-28 MED ORDER — MELATONIN 5 MG PO TABS
10.0000 mg | ORAL_TABLET | Freq: Every evening | ORAL | Status: DC | PRN
  Administered 2020-11-28: 10 mg via ORAL
  Filled 2020-11-28: qty 2

## 2020-11-28 MED ORDER — ACETAMINOPHEN 650 MG RE SUPP
650.0000 mg | RECTAL | Status: DC | PRN
  Administered 2020-11-28: 650 mg via RECTAL
  Filled 2020-11-28: qty 1

## 2020-11-28 MED ORDER — ACETAMINOPHEN 325 MG PO TABS
650.0000 mg | ORAL_TABLET | ORAL | Status: DC | PRN
  Administered 2020-11-28 – 2020-12-01 (×4): 650 mg via ORAL
  Filled 2020-11-28 (×4): qty 2

## 2020-11-28 NOTE — Progress Notes (Signed)
Neurology Progress Note  HPI- Eric Jefferson is a 60 y.o. male with a PMHx of HIV on HAART, TB in 2009, CKD III, tobacco abuse, GERD, and HLD. Patient presented to [facility] ED today with 3 days of AMS, including lying around, not eating and not talking.  Per daughter, all household family members were sick with fever and cough about 2 weeks ago. COVID neg. Everyone healed except patient who required Benadryl and OTC cold meds. However, he progressed to not eating, talking, or ambulating for past 3 days. Daughter states he would just lie there with eyes closed and occasionally open his eyes, but mostly stared and smiled. Per daughter he is normally independent, works at [employer], drives and can perform all of his own ADLs. She has never seen him like this before. She denies any hx of depression, schizophrenia, or bipolar disease.  Per daughter he is normally independent, works at [employer], drives and can perform all of his own ADLs. She has never seen him like this before. She denies any hx of depression, schizophrenia, or bipolar disease.   S:// Patient is having a mild headache, unclear if positional.  Having some light sensitivity and reporting some abdominal pain.  Much improved interaction compared to prior.  Son at bedside translating Removed his IV overnight Missed his Seroquel last night as he did not swallow for the nurse  O:// Current vital signs: BP 115/84 (BP Location: Left Arm)   Pulse 90   Temp 97.8 F (36.6 C) (Oral)   Resp 20   Ht 5\' 3"  (1.6 m)   Wt 54 kg   SpO2 96%   BMI 21.09 kg/m  Vital signs in last 24 hours: Temp:  [97.8 F (36.6 C)-98.8 F (37.1 C)] 97.8 F (36.6 C) (01/29 0843) Pulse Rate:  [77-104] 90 (01/29 0843) Resp:  [16-20] 20 (01/29 0843) BP: (104-122)/(68-84) 115/84 (01/29 0843) SpO2:  [96 %-100 %] 96 % (01/29 0843) GENERAL: Awake, alert in NAD, mitts and belt in place for restraints.  HEENT: - Normocephalic and atraumatic, dry mucous membranes LUNGS -  Symmetrical chest rise, No labored breathing noted CV - no JVD, No Peripheral Edema ABDOMEN - Soft,  nondistended  Ext: warm, well perfused, intact peripheral pulses, Olecranon deformity noted on left.  No redness or swelling.  NEURO:  Mental Status: AA; improving attention and concentration, now able to follow any more commands and converse appropriately.  Language difficult to fully assess given language barrier.  Does appear to understand some things in English Cranial Nerves: Tracks examiner in all directions, face symmetric, tongue midline Motor: Moves all 4 extremities. Unable to follow commands for strength testing. Kernig's and Brudzinski's signs are negative. DTR's 3+ patellar's, clonus in the ankles bilaterally.  2+ biceps and brachioradialis bilaterally, jaw jerk negative Tone: Normal in the upper extremities Sensation- Intact to light touch bilaterally.  Coordination: With his right hand to touch his hands nose and reach his but past my finger, and does not seem to clearly orient to the target of my finger Gait- deferred   Medications  Current Facility-Administered Medications:  .  acetaminophen (TYLENOL) suppository 650 mg, 650 mg, Rectal, Q4H PRN, Vernelle Emerald, MD, 650 mg at 11/28/20 0208 .  acyclovir (ZOVIRAX) 500 mg in dextrose 5 % 100 mL IVPB, 500 mg, Intravenous, Q8H, Tyrone Apple, RPH, Last Rate: 110 mL/hr at 11/28/20 0617, 500 mg at 11/28/20 0617 .  albuterol (PROVENTIL) (2.5 MG/3ML) 0.083% nebulizer solution 2.5 mg, 2.5 mg, Nebulization,  Q6H PRN, Tamala Julian, Rondell A, MD .  ketorolac (TORADOL) 30 MG/ML injection 30 mg, 30 mg, Intravenous, Q6H PRN, Shalhoub, Sherryll Burger, MD .  lactated ringers infusion, , Intravenous, Continuous, Adhikari, Amrit, MD, Last Rate: 75 mL/hr at 11/27/20 1413, New Bag at 11/27/20 1413 .  LORazepam (ATIVAN) injection 0.5-1 mg, 0.5-1 mg, Intravenous, Once PRN, Tamala Julian, Rondell A, MD .  ondansetron (ZOFRAN) tablet 4 mg, 4 mg, Oral, Q6H PRN **OR**  ondansetron (ZOFRAN) injection 4 mg, 4 mg, Intravenous, Q6H PRN, Tamala Julian, Rondell A, MD .  QUEtiapine (SEROQUEL) tablet 25 mg, 25 mg, Oral, QHS, Adhikari, Amrit, MD .  sodium chloride flush (NS) 0.9 % injection 3 mL, 3 mL, Intravenous, Q12H, Tamala Julian, Rondell A, MD, 3 mL at 11/27/20 0835 Labs   Basic Metabolic Panel: Recent Labs  Lab 11/25/20 2234 11/27/20 0731 11/28/20 0127  NA 138 139 138  K 4.3 3.8 3.7  CL 106 105 106  CO2 21* 22 21*  GLUCOSE 101* 114* 90  BUN 17 18 13   CREATININE 1.00 1.01 0.96  CALCIUM 8.7* 8.6* 8.5*    CBC: Recent Labs  Lab 11/25/20 2234 11/27/20 0731  WBC 5.5 4.8  HGB 15.5 14.2  HCT 45.8 41.0  MCV 85.4 84.5  PLT 162 184    Coagulation Studies: Recent Labs    11/26/20 1440  LABPROT 12.7  INR 1.0     Additional pertinent data resulted: RPR negative TSH within normal limits B12 570 CK 108 Ethanol level on admission undetectable Serum cryptococcal antigen negative  CSF studies notable for WBC 7/14, RBC 1/0, protein 6 2, glucose 63 (serum glucose 114) CSF cryptococcal antigen negative  Pending labs -Thiamine (serum) CSF: -VZV IgG -HSV PCR -VDRL CSF -Fungal culture, bacterial culture negative to date  EEG within normal limits, no evidence of encephalopathy  Imaging I have reviewed images in epic and the results pertinent to this consultation are:  CT-scan of the brain 1. No acute intracranial process. 2. Right maxillary and sphenoid sinusitis.  MRI examination of the brain, with and without contrast No abnormal enhancement, no acute process.  Assessment: Pt is a 60 yo male with hx of HIV on HAART with what sounds like a recent viral illness and COVID negative, who presents to ED with a 3 day hx of not eating, drinking, ambulating, or speaking. CT head and MRI neg. CSF with mild pleocytosis which can be seen secondary to HIV infection but viral studies are still pending.  Examination is markedly improving but today reveals concern  for myelopathic reflexes in the lower extremities, which are not present in the upper extremities.  This is concerning for potential C-spine or T-spine pathology.  Recommendations:  # Myelopathy - MRI C-spine and T-spine with and without contrast  # AMS, c/f viral meningitis  - Continue Acyclovir every 8 hours (started on 11/26/20 ) - Continue Seroquel 25 mg daily at bedtime for agitation. - Appreciate IV fluid management per primary team - Speech-language evaluation for swallow study - Will F/U on CSF viral studies and cultures. - Appreciate ID on board - Neurology will follow.  Lesleigh Noe MD-PhD [facility] [phone]

## 2020-11-28 NOTE — Progress Notes (Signed)
PROGRESS NOTE    Eric Jefferson  IOX:[id number] DOB: 1961-07-10 DOA: 11/25/2020 PCP: [name], MD   Chief Complain: Altered mental status  Brief Narrative: Patient is a 60 year old [place] speaking male with history of HIV on therapy , previous pulmonary TB, hyperlipidemia, CKD, tobacco abuse who presents with 3-day history of altered mental status. There was report of fever up to 125 at home along with headache, chills, nonproductive cough. On presentation he was afebrile. Blood pressure was elevated. CT imaging of the brain showed maxillary and sphenoid sinusitis. Liver enzymes were mild elevated. Covid screen test was negative. Chest x-ray showed emphysematous changes. MRI of the brain did not show any acute intracranial normalities.CNS viral  infection suspected. Neurology and ID consulted. Started on acyclovir empirically. Underwent lumbar puncture by IR on 11/27/20.  Assessment & Plan:   Principal Problem:   Acute metabolic encephalopathy Active Problems:   Human immunodeficiency virus (HIV) disease (HCC)   TOBACCO ABUSE   Transaminitis   Altered mental status:Suspected to be from CNS infection.Likely aseptic meningitis.  Started on acyclovir empirically. Neurology attempted lumbar puncture at bedside but was unsuccessful. Underwent  fluoroscopy guided LP.  CSF analysis showed WBC count of 14( lymphocytic predominance) elevated protein.  Culture so far negative, no organisms seen. Cryptococcal antigen negative.  Pending HSV PCR, VDRL On presentation, patient was completely altered, not following commands, did not speak. There is report of headache, fever at home. Brain imagings did not show an acute intracranial abnormalities. UDS was negative. ID, neurology following. Started on low-dose Seroquel. TSH,vitamin B12 normal EEG did not show any seizures . MRI C-spine and T-spine have been ordered by neurology today.  His mental status is significantly improved.  He is more  communicative, oriented to place.  We will discontinue the restraints.  We will start him on regular diet.  History of HIV: On tivicay and Symtuza. Follows with Dr. [name], ID.  CD4 of 131.  We will consider restarting his HIV meds when he is able to take orally  Transaminitis: Mild. Continue to monitor.Will check hepatitis panel  History of pulmonary tuberculosis: No active issues  Tobacco use: Currently smoking..        DVT prophylaxis:SCD Code Status: Full Family Communication: Discussed with son at bedside  status is: Inpatient  Remains inpatient appropriate because:Inpatient level of care appropriate due to severity of illness   Dispo: The patient is from: Home              Anticipated d/c is to: Home              Anticipated d/c date is:2-3 days              Patient currently is not medically stable to d/c.   Difficult to place patient No      Consultants: Neurology, ID  Procedures:None  Antimicrobials:  Anti-infectives (From admission, onward)   Start     Dose/Rate Route Frequency Ordered Stop   11/26/20 2030  acyclovir (ZOVIRAX) 500 mg in dextrose 5 % 100 mL IVPB        500 mg 110 mL/hr over 60 Minutes Intravenous Every 8 hours 11/26/20 1917        Subjective:  Patient seen and examined the bedside this morning.  Hemodynamically stable.  Comfortable today.  More alert, awake and communicated.  Knew that he is in the hospital.  Son was at the bedside.  Cooperative.   Objective: Vitals:   11/27/20 1727 11/27/20 2018 11/28/20  0030 11/28/20 0352  BP: 117/79 114/68 115/82 104/81  Pulse: (!) 104 77 80 80  Resp: 20 16 16 16   Temp: 98.4 F (36.9 C) 97.9 F (36.6 C) 98.3 F (36.8 C) 98.1 F (36.7 C)  TempSrc: Axillary Oral Oral Oral  SpO2: 99% 98% 100% 100%  Weight:      Height:        Intake/Output Summary (Last 24 hours) at 11/28/2020 [id number] Last data filed at 11/27/2020 2121 Gross per 24 hour  Intake 467.23 ml  Output 1300 ml  Net -832.77 ml    Filed Weights   11/26/20 1900  Weight: 54 kg    Examination:  General exam: Appears calm and comfortable ,Not in distress,average built HEENT:PERRL,Oral mucosa moist, Ear/Nose normal on gross exam Respiratory system: Bilateral equal air entry, normal vesicular breath sounds, no wheezes or crackles  Cardiovascular system: S1 & S2 heard, RRR. No JVD, murmurs, rubs, gallops or clicks. Gastrointestinal system: Abdomen is nondistended, soft and nontender. No organomegaly or masses felt. Normal bowel sounds heard. Central nervous system: Alert and awake, oriented to place only Extremities: No edema, no clubbing ,no cyanosis Skin: No rashes, lesions or ulcers,no icterus ,no pallor    Data Reviewed: I have personally reviewed following labs and imaging studies  CBC: Recent Labs  Lab 11/25/20 2234 11/27/20 0731  WBC 5.5 4.8  HGB 15.5 14.2  HCT 45.8 41.0  MCV 85.4 84.5  PLT 162 [id number]   Basic Metabolic Panel: Recent Labs  Lab 11/25/20 2234 11/27/20 0731 11/28/20 0127  NA 138 139 138  K 4.3 3.8 3.7  CL 106 105 106  CO2 21* 22 21*  GLUCOSE 101* 114* 90  BUN 17 18 13   CREATININE 1.00 1.01 0.96  CALCIUM 8.7* 8.6* 8.5*   GFR: Estimated Creatinine Clearance: 61.7 mL/min (by C-G formula based on SCr of 0.96 mg/dL). Liver Function Tests: Recent Labs  Lab 11/26/20 0700 11/27/20 0731 11/28/20 0127  AST 79* 73* 61*  ALT 57* 57* 53*  ALKPHOS 71 70 65  BILITOT 0.9 0.7 0.9  PROT 6.7 6.5 6.7  ALBUMIN 3.4* 3.2* 3.3*   No results for input(s): LIPASE, AMYLASE in the last 168 hours. Recent Labs  Lab 11/26/20 0700  AMMONIA 16   Coagulation Profile: Recent Labs  Lab 11/26/20 1440  INR 1.0   Cardiac Enzymes: Recent Labs  Lab 11/26/20 0700  CKTOTAL 108   BNP (last 3 results) No results for input(s): PROBNP in the last 8760 hours. HbA1C: No results for input(s): HGBA1C in the last 72 hours. CBG: Recent Labs  Lab 11/26/20 0754  GLUCAP 87   Lipid Profile: No  results for input(s): CHOL, HDL, LDLCALC, TRIG, CHOLHDL, LDLDIRECT in the last 72 hours. Thyroid Function Tests: Recent Labs    11/26/20 1107  TSH 0.948   Anemia Panel: Recent Labs    11/26/20 1107  VITAMINB12 570   Sepsis Labs: Recent Labs  Lab 11/25/20 2234 11/26/20 0700  LATICACIDVEN 1.1 1.5    Recent Results (from the past 240 hour(s))  SARS Coronavirus 2 by RT PCR (hospital order, performed in [facility] hospital lab) Nasopharyngeal Nasopharyngeal Swab     Status: None   Collection Time: 11/25/20 11:26 PM   Specimen: Nasopharyngeal Swab  Result Value Ref Range Status   SARS Coronavirus 2 NEGATIVE NEGATIVE Final    Comment: (NOTE) SARS-CoV-2 target nucleic acids are NOT DETECTED.  The SARS-CoV-2 RNA is generally detectable in upper and lower respiratory specimens during the acute  phase of infection. The lowest concentration of SARS-CoV-2 viral copies this assay can detect is 250 copies / mL. A negative result does not preclude SARS-CoV-2 infection and should not be used as the sole basis for treatment or other patient management decisions.  A negative result may occur with improper specimen collection / handling, submission of specimen other than nasopharyngeal swab, presence of viral mutation(s) within the areas targeted by this assay, and inadequate number of viral copies (<250 copies / mL). A negative result must be combined with clinical observations, patient history, and epidemiological information.  Fact Sheet for Patients:   StrictlyIdeas.no  Fact Sheet for Healthcare Providers: BankingDealers.co.za  This test is not yet approved or  cleared by the [place] FDA and has been authorized for detection and/or diagnosis of SARS-CoV-2 by FDA under an Emergency Use Authorization (EUA).  This EUA will remain in effect (meaning this test can be used) for the duration of the COVID-19 declaration under Section  564(b)(1) of the Act, 21 U.S.C. section 360bbb-3(b)(1), unless the authorization is terminated or revoked sooner.  Performed at [facility], [place] [address], [place], [place] [address]   Respiratory (~20 pathogens) panel by PCR     Status: None   Collection Time: 11/26/20  2:17 PM   Specimen: Nasopharyngeal Swab; Respiratory  Result Value Ref Range Status   Adenovirus NOT DETECTED NOT DETECTED Final   Coronavirus 229E NOT DETECTED NOT DETECTED Final    Comment: (NOTE) The Coronavirus on the Respiratory Panel, DOES NOT test for the novel  Coronavirus (2019 nCoV)    Coronavirus HKU1 NOT DETECTED NOT DETECTED Final   Coronavirus NL63 NOT DETECTED NOT DETECTED Final   Coronavirus OC43 NOT DETECTED NOT DETECTED Final   Metapneumovirus NOT DETECTED NOT DETECTED Final   Rhinovirus / Enterovirus NOT DETECTED NOT DETECTED Final   Influenza A NOT DETECTED NOT DETECTED Final   Influenza B NOT DETECTED NOT DETECTED Final   Parainfluenza Virus 1 NOT DETECTED NOT DETECTED Final   Parainfluenza Virus 2 NOT DETECTED NOT DETECTED Final   Parainfluenza Virus 3 NOT DETECTED NOT DETECTED Final   Parainfluenza Virus 4 NOT DETECTED NOT DETECTED Final   Respiratory Syncytial Virus NOT DETECTED NOT DETECTED Final   Bordetella pertussis NOT DETECTED NOT DETECTED Final   Bordetella Parapertussis NOT DETECTED NOT DETECTED Final   Chlamydophila pneumoniae NOT DETECTED NOT DETECTED Final   Mycoplasma pneumoniae NOT DETECTED NOT DETECTED Final    Comment: Performed at [facility], [place]. [address], [place], [place] [address]  CSF culture     Status: None (Preliminary result)   Collection Time: 11/27/20  9:38 AM   Specimen: PATH Cytology CSF; Cerebrospinal Fluid  Result Value Ref Range Status   Specimen Description CSF  Final   Special Requests NONE  Final   Gram Stain   Final    WBC PRESENT, PREDOMINANTLY MONONUCLEAR NO ORGANISMS SEEN CYTOSPIN SMEAR Performed at [facility], [place] [address] [place], [place] [address]    Culture PENDING  Incomplete   Report Status PENDING  Incomplete         Radiology Studies: EEG  Result Date: 11/27/2020 Lora Havens, MD     11/27/2020  4:47 PM Patient Name: Eric Jefferson MRN: [id number] Epilepsy Attending: Lora Havens Referring Physician/Provider: Dr Lesleigh Noe Date: 11/27/2020 Duration: 23.42 mins Patient history: 59yo M with ams. EEG to evaluate for seizure Level of alertness: Awake AEDs during EEG study: None Technical  aspects: This EEG study was done with scalp electrodes positioned according to the 10-20 International system of electrode placement. Electrical activity was acquired at a sampling rate of 500Hz  and reviewed with a high frequency filter of 70Hz  and a low frequency filter of 1Hz . EEG data were recorded continuously and digitally stored. Description: The posterior dominant rhythm consists of 8 Hz activity of moderate voltage (25-35 uV) seen predominantly in posterior head regions, symmetric and reactive to eye opening and eye closing. Hyperventilation and photic stimulation were not performed.   IMPRESSION: This study is within normal limits. No seizures or epileptiform discharges were seen throughout the recording. Lora Havens   MR BRAIN W CONTRAST  Result Date: 11/26/2020 CLINICAL DATA:  Mental status changes. Recent systemic illness. Noncontrast MRI does not show any significant finding. EXAM: MRI HEAD WITH CONTRAST TECHNIQUE: Multiplanar, multiecho pulse sequences of the brain and surrounding structures were obtained with intravenous contrast. CONTRAST:  39mL GADAVIST GADOBUTROL 1 MMOL/ML IV SOLN COMPARISON:  Earlier same day FINDINGS: After contrast administration, there is no abnormal enhancement of the brain or leptomeninges. Major vascular structures show flow. Probable tiny insignificant venous angioma at the left frontoparietal vertex. IMPRESSION: No abnormal enhancement. Electronically Signed   By: Nelson Chimes M.D.   On: 11/26/2020 10:01   DG FL GUIDED LUMBAR PUNCTURE  Result Date: 11/27/2020 Macy Mis, MD     11/27/2020  9:26 AM Lumbar Puncture Procedure Note Rodger Malick PY:[id number] [birth year] Date:11/27/20 Time:9:25 AM Provider Performing:Praneil Posey Pronto Procedure: Fluoroscopic Guided Lumbar Puncture Consent Risks of the procedure as well as the alternatives and risks of each were explained to the patient and/or caregiver.  Consent for the procedure was obtained and is signed in the bedside chart Anesthesia Topical only with 1% lidocaine Time Out Verified patient identification, verified procedure, site/side was marked, verified correct patient position, special equipment/implants available, medications/allergies/relevant history reviewed, required imaging and test results available. Sterile Technique Maximal sterile technique including sterile barrier drape, hand hygiene, sterile gown, sterile gloves, mask, hair covering. Procedure Description Using palpation, approximate location of L4-L5 space identified.   Lidocaine used to anesthetize skin and subcutaneous tissue overlying this area.  A 22g spinal needle was then used to access the subarachnoid space. CSF obtained. Complications/Tolerance None; patient tolerated the procedure well. EBL Minimal Specimen(s) CSF        Scheduled Meds: . QUEtiapine  25 mg Oral QHS  . sodium chloride flush  3 mL Intravenous Q12H   Continuous Infusions: . acyclovir 500 mg (11/28/20 0617)  . lactated ringers 75 mL/hr at 11/27/20 1413     LOS: 2 days    Time spent: 25  mins.More than 50% of that time was spent in counseling and/or coordination of care.      Shelly Coss, MD [facility] P1/29/2022, 8:12 AM

## 2020-11-28 NOTE — Plan of Care (Signed)

## 2020-11-28 NOTE — Evaluation (Signed)
Clinical/Bedside Swallow Evaluation Patient Details  Name: Eric Jefferson MRN: [id number] Date of Birth: 09-Apr-1961  Today's Date: 11/28/2020 Time: SLP Start Time (ACUTE ONLY): 1045 SLP Stop Time (ACUTE ONLY): 1105 SLP Time Calculation (min) (ACUTE ONLY): 20 min  Past Medical History:  Past Medical History:  Diagnosis Date  . Arthritis   . Diabetes ([place])    02-26-20 pt denies  . Headache   . HIV (human immunodeficiency virus infection) ([place])   . TB (pulmonary tuberculosis)    dx in 2009- took years worth of medication for this   Past Surgical History: History reviewed. No pertinent surgical history. HPI:  Patient is a 60 y.o. male with PMH: HIV on HAART, TB in 2009, CKD III, tobacco abuse, GERD, HLD who presented to [facility] ED with 3 days of AMS including lying around, not eating or drinking much, not talking. Daughter had reported that all household members were sick with fevere and cough 2 weeks prior and patient was the only one who did not recover. COVID screen negative. At baseline, patient reportedly is independent with all ADL's, works at [employer], drives.   Assessment / Plan / Recommendation Clinical Impression  Patient presents with a mild oropharyngeal dysphagia with likely cognitive component as patient is with improved alertness but still not at baseline cognitively. He exhibited slow oral motor movements during oral preparatory phase for drinking via straw sips, puree solids, regular solids. Mastication and oral transit of solids also delayed. He did not exhibit any coughing, throat clearing or other overt s/s aspiration or penetration but SLP suspects a mild swallow initiation delay. SLP Visit Diagnosis: Dysphagia, unspecified (R13.10)    Aspiration Risk  Mild aspiration risk    Diet Recommendation Dysphagia 3 (Mech soft);Thin liquid   Liquid Administration via: Cup;Straw Medication Administration: Whole meds with puree Supervision: Full supervision/cueing for compensatory  strategies;Staff to assist with self feeding Compensations: Minimize environmental distractions;Slow rate;Small sips/bites Postural Changes: Seated upright at 90 degrees    Other  Recommendations Oral Care Recommendations: Oral care BID;Staff/trained caregiver to provide oral care   Follow up Recommendations None      Frequency and Duration min 1 x/week  1 week       Prognosis Prognosis for Safe Diet Advancement: Good      Swallow Study   General Date of Onset: 11/26/20 HPI: Patient is a 60 y.o. male with PMH: HIV on HAART, TB in 2009, CKD III, tobacco abuse, GERD, HLD who presented to [facility] ED with 3 days of AMS including lying around, not eating or drinking much, not talking. Daughter had reported that all household members were sick with fevere and cough 2 weeks prior and patient was the only one who did not recover. COVID screen negative. At baseline, patient reportedly is independent with all ADL's, works at [employer], drives. Type of Study: Bedside Swallow Evaluation Previous Swallow Assessment: None found Diet Prior to this Study: Regular;Thin liquids Temperature Spikes Noted: No Respiratory Status: Room air History of Recent Intubation: No Behavior/Cognition: Alert;Cooperative;Requires cueing Oral Cavity Assessment: Within Functional Limits Oral Care Completed by SLP: Yes Oral Cavity - Dentition: Adequate natural dentition Vision: Functional for self-feeding Self-Feeding Abilities: Total assist;Other (Comment) (bilateral restraint mitts preventing self-feeding) Patient Positioning: Upright in bed Baseline Vocal Quality: Normal Volitional Cough: Cognitively unable to elicit Volitional Swallow: Unable to elicit    Oral/Motor/Sensory Function Overall Oral Motor/Sensory Function: Within functional limits   Ice Chips     Thin Liquid Thin Liquid: Impaired Presentation: Straw Oral  Phase Impairments: Reduced labial seal Pharyngeal  Phase Impairments: Suspected delayed  Swallow    Nectar Thick     Honey Thick     Puree Puree: Impaired Oral Phase Impairments: Reduced labial seal Pharyngeal Phase Impairments: Suspected delayed Swallow   Solid     Solid: Impaired Oral Phase Impairments: Impaired mastication Oral Phase Functional Implications: Prolonged oral transit;Impaired mastication      Sonia Baller, MA, CCC-SLP Speech Therapy [facility]

## 2020-11-28 NOTE — Progress Notes (Signed)
[facility] Infectious Disease    Date of Admission:  11/25/2020   Total days of antibiotics 2/acyclovir   ID: Eric Jefferson is a 60 y.o. male with   Principal Problem:   Acute metabolic encephalopathy Active Problems:   Human immunodeficiency virus (HIV) disease ([place])   TOBACCO ABUSE   Transaminitis    Subjective: I spoke with patient/son today. His son reports that patient was never confused. Rather, he was having fatigue/generalized weakness/poor appetite, along with subjective fever  He is feeling a lot better today, oob. No confusion Eating better  Csf hsv pcr/vzv igg still in process; these are sentout test No n/v/diarrhea/rash, joint pain, headache   Medications:  . QUEtiapine  25 mg Oral QHS  . sodium chloride flush  3 mL Intravenous Q12H    Objective: Vital signs in last 24 hours: Temp:  [97.8 F (36.6 C)-98.4 F (36.9 C)] 98 F (36.7 C) (01/29 1224) Pulse Rate:  [77-104] 78 (01/29 1224) Resp:  [16-20] 18 (01/29 1224) BP: (104-117)/(68-92) 111/92 (01/29 1224) SpO2:  [96 %-100 %] 98 % (01/29 1224) Physical Exam  General: conversant; tracking/following commands; no distress HEENT: oropahrynx clear; conj clear; eomi CV: rrr no mrg Lungs: clear abd s/nt Ext no edema Skin no rash Neuro: nonfocal Psych: alert, oriented   Lab Results Recent Labs    11/25/20 2234 11/27/20 0731 11/28/20 0127  WBC 5.5 4.8  --   HGB 15.5 14.2  --   HCT 45.8 41.0  --   NA 138 139 138  K 4.3 3.8 3.7  CL 106 105 106  CO2 21* 22 21*  BUN 17 18 13   CREATININE 1.00 1.01 0.96   Liver Panel Recent Labs    11/26/20 0700 11/27/20 0731 11/28/20 0127  PROT 6.7 6.5 6.7  ALBUMIN 3.4* 3.2* 3.3*  AST 79* 73* 61*  ALT 57* 57* 53*  ALKPHOS 71 70 65  BILITOT 0.9 0.7 0.9  BILIDIR 0.3*  --   --   IBILI 0.6  --   --    Cd 4 count of < 200  Microbiology: Reviewed  Studies/Results: EEG  Result Date: 11/27/2020 Eric Havens, MD     11/27/2020  4:47 PM Patient  Name: Eric Jefferson MRN: [id number] Epilepsy Attending: Lora Jefferson Referring Physician/Provider: Dr Eric Jefferson Date: 11/27/2020 Duration: 23.42 mins Patient history: 59yo M with ams. EEG to evaluate for seizure Level of alertness: Awake AEDs during EEG study: None Technical aspects: This EEG study was done with scalp electrodes positioned according to the 10-20 International system of electrode placement. Electrical activity was acquired at a sampling rate of 500Hz  and reviewed with a high frequency filter of 70Hz  and a low frequency filter of 1Hz . EEG data were recorded continuously and digitally stored. Description: The posterior dominant rhythm consists of 8 Hz activity of moderate voltage (25-35 uV) seen predominantly in posterior head regions, symmetric and reactive to eye opening and eye closing. Hyperventilation and photic stimulation were not performed.   IMPRESSION: This study is within normal limits. No seizures or epileptiform discharges were seen throughout the recording. [place] GUIDED LUMBAR PUNCTURE  Result Date: 11/27/2020 Eric Mis, MD     11/27/2020  9:26 AM Lumbar Puncture Procedure Note Eric Jefferson [id number] [birth year] Date:11/27/20 Time:9:25 AM Provider Performing:Eric Jefferson Procedure: Fluoroscopic Guided Lumbar Puncture Consent Risks of the procedure as well as the alternatives and risks of each were explained to the patient and/or caregiver.  Consent for the procedure was obtained and is signed in the bedside chart Anesthesia Topical only with 1% lidocaine Time Out Verified patient identification, verified procedure, site/side was marked, verified correct patient position, special equipment/implants available, medications/allergies/relevant history reviewed, required imaging and test results available. Sterile Technique Maximal sterile technique including sterile barrier drape, hand hygiene, sterile gown, sterile gloves, mask, hair covering. Procedure Description  Using palpation, approximate location of L4-L5 space identified.   Lidocaine used to anesthetize skin and subcutaneous tissue overlying this area.  A 22g spinal needle was then used to access the subarachnoid space. CSF obtained. Complications/Tolerance None; patient tolerated the procedure well. EBL Minimal Specimen(s) CSF    Imaging: 1/27 mri brain No focal enhancement/abnormality  Assessment/Plan: 60 yo male well controlled hiv admitted for generalized weakness/decreased appetite and ?confusion  Distant hx pulm tb treated     #ams On clarification 11/28/20 with his family (son), patient never had confusion (although subjective consideration could be different). His Csf with mild protein elevation but normal glucose, and only 7 wbc Family members with uri sx covid negative Crypto ag, hepatitis serology negative (slight lft elevation); respiratory viral pcr negative; rpr negative 1/27 mri brain no enhancement or acute abnormality On empiric hsv coverage. Afebrile here and no leukocytosis  Not sure if he got better (rather quickly within 1 day admission) due to supportive care or acyclovir  Given the severe morbid nature of untreated hsv/vzv; I would keep on empiric acyclovir for now  -continue empiric acyclovir -f/u csf cx (ngtd) -f/u sent out vzv igg and hsv pcr in csf  #HIV disease Well controlled previously on tivicay/symtuza Low cd4 this admission including percentage ?related to acute infection; previously low-mid 20% and 863 absolute on 03/2020 -f/u hiv viral load -resume tivicay/symtuza once intake improves     [facility]  11/28/2020, 3:06 PM

## 2020-11-29 ENCOUNTER — Inpatient Hospital Stay (HOSPITAL_COMMUNITY): Payer: Self-pay

## 2020-11-29 LAB — HIV-1 RNA QUANT-NO REFLEX-BLD
HIV 1 RNA Quant: 414000 copies/mL
LOG10 HIV-1 RNA: 5.617 log10copy/mL

## 2020-11-29 LAB — HSV 1/2 PCR, CSF
HSV-1 DNA: NEGATIVE
HSV-2 DNA: NEGATIVE

## 2020-11-29 MED ORDER — GADOBUTROL 1 MMOL/ML IV SOLN
5.5000 mL | Freq: Once | INTRAVENOUS | Status: AC | PRN
  Administered 2020-11-29: 5.5 mL via INTRAVENOUS

## 2020-11-29 NOTE — Progress Notes (Signed)
PROGRESS NOTE    Eric Jefferson  WJX:[id number] DOB: [birth year] DOA: 11/25/2020 PCP: [name], MD   Chief Complain: Altered mental status  Brief Narrative: Patient is a 60 year old [place] speaking male with history of HIV on therapy , previous pulmonary TB, hyperlipidemia, CKD, tobacco abuse who presents with 3-day history of altered mental status. There was report of fever up to 125 at home along with headache, chills, nonproductive cough. On presentation he was afebrile. Blood pressure was elevated. CT imaging of the brain showed maxillary and sphenoid sinusitis. Liver enzymes were mild elevated. Covid screen test was negative. Chest x-ray showed emphysematous changes. MRI of the brain did not show any acute intracranial normalities.CNS viral  infection suspected. Neurology and ID consulted. Started on acyclovir empirically. Underwent lumbar puncture by IR on 11/27/20.  Mental status is improving.  Assessment & Plan:   Principal Problem:   Acute metabolic encephalopathy Active Problems:   Human immunodeficiency virus (HIV) disease ([facility])   TOBACCO ABUSE   Transaminitis   Altered mental status:Suspected to be from CNS infection.Likely aseptic meningitis.  Started on acyclovir empirically. Neurology attempted lumbar puncture at bedside but was unsuccessful. Underwent  fluoroscopy guided LP.  CSF analysis showed WBC count of 14( lymphocytic predominance) elevated protein.  Culture so far negative, no organisms seen. Cryptococcal antigen negative.  Pending HSV PCR, VDRL On presentation, patient was completely altered, not following commands, did not speak. There was report of headache, fever at home. Brain imagings did not show an acute intracranial abnormalities. UDS was negative. ID, neurology following. Started on low-dose Seroquel. TSH,vitamin B12 normal EEG did not show any seizures . MRI C-spine and T-spine were ordered by neurology which did not show any acute pathologies.  His  mental status had significantly improved.  He was more communicative, oriented to place yesterday.    Started on dysphagia 3 diet  He was sleepy this morning because he was given Ativan before going to MRI.  History of HIV: On tivicay and Symtuza. Follows with Dr. [name], ID.  CD4 of 131.  We will consider restarting his HIV meds .Viral load pending  Transaminitis: Mild. Continue to monitor.Will check hepatitis panel  History of pulmonary tuberculosis: No active issues  Tobacco use: Currently smoking..        DVT prophylaxis:SCD Code Status: Full Family Communication: Discussed with son at bedside  status is: Inpatient  Remains inpatient appropriate because:Inpatient level of care appropriate due to severity of illness   Dispo: The patient is from: Home              Anticipated d/c is to: Home              Anticipated d/c date is:2-3 days              Patient currently is not medically stable to d/c.   Difficult to place patient No      Consultants: Neurology, ID  Procedures:None  Antimicrobials:  Anti-infectives (From admission, onward)   Start     Dose/Rate Route Frequency Ordered Stop   11/26/20 2030  acyclovir (ZOVIRAX) 500 mg in dextrose 5 % 100 mL IVPB        500 mg 110 mL/hr over 60 Minutes Intravenous Every 8 hours 11/26/20 1917        Subjective:  Patient seen and examined the bedside this morning.  Hemodynamically stable.  He was in sleep when I entered the room.  He was given Ativan before going for MRI.  As per the son, he was doing better and slept last night.   Objective: Vitals:   11/28/20 1624 11/28/20 2029 11/29/20 0033 11/29/20 0329  BP: 116/80 121/86 93/66 104/66  Jefferson: 80 71 76 67  Resp: 20 20 19 17   Temp: 98 F (36.7 C) 97.6 F (36.4 C) 98.4 F (36.9 C) 98.3 F (36.8 C)  TempSrc: Oral Oral    SpO2: 97% 95% 98% 98%  Weight:      Height:        Intake/Output Summary (Last 24 hours) at 11/29/2020 [id number] Last data filed at 11/28/2020  1405 Gross per 24 hour  Intake -  Output 600 ml  Net -600 ml   Filed Weights   11/26/20 1900  Weight: 54 kg    Examination:  General exam: sleeping Respiratory system: Bilateral equal air entry, normal vesicular breath sounds, no wheezes or crackles  Cardiovascular system: S1 & S2 heard, RRR. No JVD, murmurs, rubs, gallops or clicks. Gastrointestinal system: Abdomen is nondistended, soft and nontender. Central nervous system: Sleeping Extremities: No edema, no clubbing ,no cyanosis Skin: No rashes, lesions or ulcers,no icterus ,no pallor    Data Reviewed: I have personally reviewed following labs and imaging studies  CBC: Recent Labs  Lab 11/25/20 2234 11/27/20 0731  WBC 5.5 4.8  HGB 15.5 14.2  HCT 45.8 41.0  MCV 85.4 84.5  PLT 162 [id number]   Basic Metabolic Panel: Recent Labs  Lab 11/25/20 2234 11/27/20 0731 11/28/20 0127  NA 138 139 138  K 4.3 3.8 3.7  CL 106 105 106  CO2 21* 22 21*  GLUCOSE 101* 114* 90  BUN 17 18 13   CREATININE 1.00 1.01 0.96  CALCIUM 8.7* 8.6* 8.5*   GFR: Estimated Creatinine Clearance: 61.7 mL/min (by C-G formula based on SCr of 0.96 mg/dL). Liver Function Tests: Recent Labs  Lab 11/26/20 0700 11/27/20 0731 11/28/20 0127  AST 79* 73* 61*  ALT 57* 57* 53*  ALKPHOS 71 70 65  BILITOT 0.9 0.7 0.9  PROT 6.7 6.5 6.7  ALBUMIN 3.4* 3.2* 3.3*   No results for input(s): LIPASE, AMYLASE in the last 168 hours. Recent Labs  Lab 11/26/20 0700  AMMONIA 16   Coagulation Profile: Recent Labs  Lab 11/26/20 1440  INR 1.0   Cardiac Enzymes: Recent Labs  Lab 11/26/20 0700  CKTOTAL 108   BNP (last 3 results) No results for input(s): PROBNP in the last 8760 hours. HbA1C: No results for input(s): HGBA1C in the last 72 hours. CBG: Recent Labs  Lab 11/26/20 0754  GLUCAP 87   Lipid Profile: No results for input(s): CHOL, HDL, LDLCALC, TRIG, CHOLHDL, LDLDIRECT in the last 72 hours. Thyroid Function Tests: Recent Labs     11/26/20 1107  TSH 0.948   Anemia Panel: Recent Labs    11/26/20 1107  VITAMINB12 570   Sepsis Labs: Recent Labs  Lab 11/25/20 2234 11/26/20 0700  LATICACIDVEN 1.1 1.5    Recent Results (from the past 240 hour(s))  SARS Coronavirus 2 by RT PCR (hospital order, performed in [facility] hospital lab) Nasopharyngeal Nasopharyngeal Swab     Status: None   Collection Time: 11/25/20 11:26 PM   Specimen: Nasopharyngeal Swab  Result Value Ref Range Status   SARS Coronavirus 2 NEGATIVE NEGATIVE Final    Comment: (NOTE) SARS-CoV-2 target nucleic acids are NOT DETECTED.  The SARS-CoV-2 RNA is generally detectable in upper and lower respiratory specimens during the acute phase of infection. The lowest concentration of SARS-CoV-2 viral  copies this assay can detect is 250 copies / mL. A negative result does not preclude SARS-CoV-2 infection and should not be used as the sole basis for treatment or other patient management decisions.  A negative result may occur with improper specimen collection / handling, submission of specimen other than nasopharyngeal swab, presence of viral mutation(s) within the areas targeted by this assay, and inadequate number of viral copies (<250 copies / mL). A negative result must be combined with clinical observations, patient history, and epidemiological information.  Fact Sheet for Patients:   StrictlyIdeas.no  Fact Sheet for Healthcare Providers: BankingDealers.co.za  This test is not yet approved or  cleared by the [place] FDA and has been authorized for detection and/or diagnosis of SARS-CoV-2 by FDA under an Emergency Use Authorization (EUA).  This EUA will remain in effect (meaning this test can be used) for the duration of the COVID-19 declaration under Section 564(b)(1) of the Act, 21 U.S.C. section 360bbb-3(b)(1), unless the authorization is terminated or revoked sooner.  Performed at  [facility], [place] [address] [place], [place] [address]   Respiratory (~20 pathogens) panel by PCR     Status: None   Collection Time: 11/26/20  2:17 PM   Specimen: Nasopharyngeal Swab; Respiratory  Result Value Ref Range Status   Adenovirus NOT DETECTED NOT DETECTED Final   Coronavirus 229E NOT DETECTED NOT DETECTED Final    Comment: (NOTE) The Coronavirus on the Respiratory Panel, DOES NOT test for the novel  Coronavirus (2019 nCoV)    Coronavirus HKU1 NOT DETECTED NOT DETECTED Final   Coronavirus NL63 NOT DETECTED NOT DETECTED Final   Coronavirus OC43 NOT DETECTED NOT DETECTED Final   Metapneumovirus NOT DETECTED NOT DETECTED Final   Rhinovirus / Enterovirus NOT DETECTED NOT DETECTED Final   Influenza A NOT DETECTED NOT DETECTED Final   Influenza B NOT DETECTED NOT DETECTED Final   Parainfluenza Virus 1 NOT DETECTED NOT DETECTED Final   Parainfluenza Virus 2 NOT DETECTED NOT DETECTED Final   Parainfluenza Virus 3 NOT DETECTED NOT DETECTED Final   Parainfluenza Virus 4 NOT DETECTED NOT DETECTED Final   Respiratory Syncytial Virus NOT DETECTED NOT DETECTED Final   Bordetella pertussis NOT DETECTED NOT DETECTED Final   Bordetella Parapertussis NOT DETECTED NOT DETECTED Final   Chlamydophila pneumoniae NOT DETECTED NOT DETECTED Final   Mycoplasma pneumoniae NOT DETECTED NOT DETECTED Final    Comment: Performed at [facility], [place]. [address], [place], [place] [address]  CSF culture     Status: None (Preliminary result)   Collection Time: 11/27/20  9:38 AM   Specimen: PATH Cytology CSF; Cerebrospinal Fluid  Result Value Ref Range Status   Specimen Description CSF  Final   Special Requests NONE  Final   Gram Stain   Final    WBC PRESENT, PREDOMINANTLY MONONUCLEAR NO ORGANISMS SEEN CYTOSPIN SMEAR Performed at [facility], [place] [address], [place], [place] [address]    Culture PENDING  Incomplete   Report Status PENDING  Incomplete         Radiology  Studies: EEG  Result Date: 11/27/2020 Lora Havens, MD     11/27/2020  4:47 PM Patient Name: Eric Jefferson MRN: [id number] Epilepsy Attending: Lora Havens Referring Physician/Provider: Dr Lesleigh Noe Date: 11/27/2020 Duration: 23.42 mins Patient history: 59yo M with ams. EEG to evaluate for seizure Level of alertness: Awake AEDs during EEG study: None Technical aspects: This EEG study was done with scalp electrodes  positioned according to the 10-20 International system of electrode placement. Electrical activity was acquired at a sampling rate of 500Hz  and reviewed with a high frequency filter of 70Hz  and a low frequency filter of 1Hz . EEG data were recorded continuously and digitally stored. Description: The posterior dominant rhythm consists of 8 Hz activity of moderate voltage (25-35 uV) seen predominantly in posterior head regions, symmetric and reactive to eye opening and eye closing. Hyperventilation and photic stimulation were not performed.   IMPRESSION: This study is within normal limits. No seizures or epileptiform discharges were seen throughout the recording. [place] GUIDED LUMBAR PUNCTURE  Result Date: 11/27/2020 Macy Mis, MD     11/27/2020  9:26 AM Lumbar Puncture Procedure Note Semisi Hardey [id number] [birth year] Date:11/27/20 Time:9:25 AM Provider Performing:Praneil Posey Pronto Procedure: Fluoroscopic Guided Lumbar Puncture Consent Risks of the procedure as well as the alternatives and risks of each were explained to the patient and/or caregiver.  Consent for the procedure was obtained and is signed in the bedside chart Anesthesia Topical only with 1% lidocaine Time Out Verified patient identification, verified procedure, site/side was marked, verified correct patient position, special equipment/implants available, medications/allergies/relevant history reviewed, required imaging and test results available. Sterile Technique Maximal sterile technique including sterile barrier  drape, hand hygiene, sterile gown, sterile gloves, mask, hair covering. Procedure Description Using palpation, approximate location of L4-L5 space identified.   Lidocaine used to anesthetize skin and subcutaneous tissue overlying this area.  A 22g spinal needle was then used to access the subarachnoid space. CSF obtained. Complications/Tolerance None; patient tolerated the procedure well. EBL Minimal Specimen(s) CSF        Scheduled Meds: . QUEtiapine  25 mg Oral QHS  . sodium chloride flush  3 mL Intravenous Q12H   Continuous Infusions: . acyclovir 500 mg (11/29/20 0618)  . lactated ringers 75 mL/hr at 11/27/20 1413     LOS: 3 days    Time spent: 25  mins.More than 50% of that time was spent in counseling and/or coordination of care.      Shelly Coss, MD [facility] P1/30/2022, 8:22 AM

## 2020-11-29 NOTE — Progress Notes (Addendum)
Transort arrived [phone] for MRI, Ativan 1mg  per MD order was administered at this time, since patient has been restless most of the night tossing and turning. Patient family stayed in room and was informed of medication and MRI.

## 2020-11-29 NOTE — Progress Notes (Addendum)
Neurology Progress Note  HPI- Eric Jefferson is a 60 y.o. male with a PMHx of HIV on HAART, TB in 2009, CKD III, tobacco abuse, GERD, and HLD. Patient presented to [facility] ED today with 3 days of AMS, including lying around, not eating and not talking.  Per daughter, all household family members were sick with fever and cough about 2 weeks ago. COVID neg. Everyone healed except patient who required Benadryl and OTC cold meds. However, he progressed to not eating, talking, or ambulating for past 3 days. Daughter states he would just lie there with eyes closed and occasionally open his eyes, but mostly stared and smiled. Per daughter he is normally independent, works at [employer], drives and can perform all of his own ADLs. She has never seen him like this before. She denies any hx of depression, schizophrenia, or bipolar disease.  Per daughter he is normally independent, works at [employer], drives and can perform all of his own ADLs. She has never seen him like this before. She denies any hx of depression, schizophrenia, or bipolar disease.   S:// Examination today with the assistance of a video interpreter Patient reports he has a headache and it is a bit better when he lays down usually He also continues to complain of abdominal pain and reports that this is why he came to the hospital.  He reports it is unchanged.  He reports that his right sided and worse with palpation, though the left side hurts with palpation as well.  O:// Current vital signs: BP 117/74 (BP Location: Left Arm)   Pulse 63   Temp 98.2 F (36.8 C)   Resp 19   Ht 5\' 3"  (1.6 m)   Wt 54 kg   SpO2 96%   BMI 21.09 kg/m  Vital signs in last 24 hours: Temp:  [97.6 F (36.4 C)-99.7 F (37.6 C)] 98.2 F (36.8 C) (01/30 1204) Pulse Rate:  [63-80] 63 (01/30 1204) Resp:  [14-20] 19 (01/30 1204) BP: (93-121)/(66-86) 117/74 (01/30 1204) SpO2:  [95 %-98 %] 96 % (01/30 1204) GENERAL: Sleeping but awakens easily. No longer has  mitts/belt in place PSYCH: Affect remains very flat, does not make good eye contact.  Frequently closes eyes and stops responding. HEENT: - Normocephalic and atraumatic, dry mucous membranes LUNGS - Symmetrical chest rise, No labored breathing noted CV - no JVD, No Peripheral Edema ABDOMEN - Soft,  nondistended, no guarding.  Reports tenderness to palpation of the right side but winces equally to palpation of the left Ext: warm, well perfused, intact peripheral pulses, Olecranon deformity noted on left, stable.  No redness or swelling.  NEURO:  Mental Status: AA; improving attention and concentration, now able to follow any more commands and converse appropriately.  Does answer sometimes in [place].  Asks for water.  Reports it is January "20-something" 2022.  Thinks he has only been in the hospital for 2 days when his length of stay is actually 3 days.  Reports he recalls meeting me yesterday Cranial Nerves: Tracks examiner in all directions, face symmetric, tongue midline Motor: Moves all 4 extremities. Unable to follow commands for strength testing. Kernig's and Brudzinski's signs are negative. DTR's 3+ patellar's, clonus in the ankles bilaterally.  2+ biceps and brachioradialis bilaterally, jaw jerk negative when checked on 1/29, not assessed today Coordination: Accurately reaches for a cup of water and brings it to his lips to drink it.  Easily and smoothly remove the Band-Aid from his left finger. Gait- deferred  Medications  Current Facility-Administered Medications:  .  acetaminophen (TYLENOL) tablet 650 mg, 650 mg, Oral, Q4H PRN, Shalhoub, Sherryll Burger, MD, 650 mg at 11/28/20 2233 .  acyclovir (ZOVIRAX) 500 mg in dextrose 5 % 100 mL IVPB, 500 mg, Intravenous, Q8H, Dang, Thuy D, RPH, Last Rate: 110 mL/hr at 11/29/20 1408, 500 mg at 11/29/20 1408 .  albuterol (PROVENTIL) (2.5 MG/3ML) 0.083% nebulizer solution 2.5 mg, 2.5 mg, Nebulization, Q6H PRN, Smith, Rondell A, MD .  ketorolac (TORADOL)  30 MG/ML injection 30 mg, 30 mg, Intravenous, Q6H PRN, Shalhoub, Sherryll Burger, MD .  lactated ringers infusion, , Intravenous, Continuous, Adhikari, Amrit, MD, Last Rate: 75 mL/hr at 11/27/20 1413, New Bag at 11/27/20 1413 .  melatonin tablet 10 mg, 10 mg, Oral, QHS PRN, Shalhoub, Sherryll Burger, MD, 10 mg at 11/28/20 2233 .  ondansetron (ZOFRAN) tablet 4 mg, 4 mg, Oral, Q6H PRN **OR** ondansetron (ZOFRAN) injection 4 mg, 4 mg, Intravenous, Q6H PRN, Smith, Rondell A, MD .  QUEtiapine (SEROQUEL) tablet 25 mg, 25 mg, Oral, QHS, Adhikari, Amrit, MD, 25 mg at 11/28/20 2233 .  sodium chloride flush (NS) 0.9 % injection 3 mL, 3 mL, Intravenous, Q12H, Tamala Julian, Rondell A, MD, 3 mL at 11/28/20 2200 Labs   Basic Metabolic Panel: Recent Labs  Lab 11/25/20 2234 11/27/20 0731 11/28/20 0127  NA 138 139 138  K 4.3 3.8 3.7  CL 106 105 106  CO2 21* 22 21*  GLUCOSE 101* 114* 90  BUN 17 18 13   CREATININE 1.00 1.01 0.96  CALCIUM 8.7* 8.6* 8.5*    CBC: Recent Labs  Lab 11/25/20 2234 11/27/20 0731  WBC 5.5 4.8  HGB 15.5 14.2  HCT 45.8 41.0  MCV 85.4 84.5  PLT 162 184    Additional pertinent data resulted: RPR negative TSH within normal limits B12 570 CK 108 Ethanol level on admission undetectable Serum cryptococcal antigen negative  CSF studies notable for WBC 7/14, RBC 1/0, protein 6 2, glucose 63 (serum glucose 114) CSF cryptococcal antigen negative  HIV RNA quantitative level 414,000  Log 10 HIV RNA 5.617 Previously undetectable in June 2021  Pending labs -Thiamine (serum) CSF: -VZV IgG -HSV PCR -VDRL CSF -Fungal culture, bacterial culture negative to date  EEG within normal limits, no evidence of encephalopathy  Imaging I have reviewed images in epic and the results pertinent to this consultation are:  CT-scan of the brain 1. No acute intracranial process. 2. Right maxillary and sphenoid sinusitis.  MRI examination of the brain, with and without contrast No abnormal  enhancement, no acute process.  MRI c-spine and T-spine w/ and w/o contrast 1. Normal MRI of the cervical cord. 2. Degenerative foraminal impingement on the right at C4-5 and left at C5-6. 3. Normal MRI of the thoracic cord. Personally reviewed, agree with radiology  Assessment: Pt is a 60 yo male with hx of HIV on HAART with what sounds like a recent viral illness and COVID negative, who presents to ED with a 3 day hx of not eating, drinking, ambulating, or speaking. CT head and MRI neg. CSF with mild pleocytosis which can be seen secondary to HIV infection but HSV/VZV viral studies are still pending.  Now that viral load has resulted demonstrating high HIV copy number compared to previously, suspect that patient has not been taking medications as prescribed. Examination remains remarkable for flat affect, unclear to me if his mental status is due to reactivation of his HIV in the setting of medication nonadherence versus depression,  versus other infection or a combination of all of the above.  Recommendations:  # AMS, c/f viral meningitis  -Continue Acyclovir every 8 hours (started on 11/26/20); defer to ID about discontinuation timeframe given patient's HIV -Defer to ID on further infectious work-up -No other neurological work-up indicated at this time -Consider psychiatry evaluation if patient continues to have a significantly depressed affect -Neurology will be available on an as-needed basis going forward, please reach out with any new questions or concerns  Lesleigh Noe MD-PhD [facility] [phone]  Approximately 40 minutes were spent in the care of this patient today, greater than 50% at bedside

## 2020-11-29 NOTE — Progress Notes (Signed)
Pharmacy Antibiotic Note  Eric Jefferson is a 60 y.o. male with history of well-controlled HIV admitted on 11/25/2020 with 3 day history of decreased oral intake and non-conversant with family.  Continues on acyclovir for rule out meningitis / encephalitis  Scr stable, CSF culture negative to date HSV pending  Plan: Continue Acyclovir 500mg  IV Q8H Monitor renal fxn, clinical progress  Height: 5\' 3"  (160 cm) Weight: 54 kg (119 lb 0.8 oz) IBW/kg (Calculated) : 56.9  Temp (24hrs), Avg:98 F (36.7 C), Min:97.6 F (36.4 C), Max:98.4 F (36.9 C)  Recent Labs  Lab 11/25/20 2234 11/26/20 0700 11/27/20 0731 11/28/20 0127  WBC 5.5  --  4.8  --   CREATININE 1.00  --  1.01 0.96  LATICACIDVEN 1.1 1.5  --   --     Estimated Creatinine Clearance: 61.7 mL/min (by C-G formula based on SCr of 0.96 mg/dL).    Allergies  Allergen Reactions  . Soy Allergy     Upset stomach only- no other issues    Thank you Anette Guarneri, PharmD 11/29/2020, 8:29 AM

## 2020-11-30 ENCOUNTER — Other Ambulatory Visit: Payer: Self-pay | Admitting: Occupational Medicine

## 2020-11-30 LAB — CBC WITH DIFFERENTIAL/PLATELET
Abs Immature Granulocytes: 0.07 10*3/uL (ref 0.00–0.07)
Basophils Absolute: 0.1 10*3/uL (ref 0.0–0.1)
Basophils Relative: 1 %
Eosinophils Absolute: 0.2 10*3/uL (ref 0.0–0.5)
Eosinophils Relative: 2 %
HCT: 38.3 % — ABNORMAL LOW (ref 39.0–52.0)
Hemoglobin: 13.4 g/dL (ref 13.0–17.0)
Immature Granulocytes: 1 %
Lymphocytes Relative: 54 %
Lymphs Abs: 4.1 10*3/uL — ABNORMAL HIGH (ref 0.7–4.0)
MCH: 29.1 pg (ref 26.0–34.0)
MCHC: 35 g/dL (ref 30.0–36.0)
MCV: 83.1 fL (ref 80.0–100.0)
Monocytes Absolute: 1.7 10*3/uL — ABNORMAL HIGH (ref 0.1–1.0)
Monocytes Relative: 22 %
Neutro Abs: 1.5 10*3/uL — ABNORMAL LOW (ref 1.7–7.7)
Neutrophils Relative %: 20 %
Platelets: 222 10*3/uL (ref 150–400)
RBC: 4.61 MIL/uL (ref 4.22–5.81)
RDW: 13.3 % (ref 11.5–15.5)
WBC: 7.6 10*3/uL (ref 4.0–10.5)
nRBC: 0 % (ref 0.0–0.2)

## 2020-11-30 LAB — BASIC METABOLIC PANEL
Anion gap: 11 (ref 5–15)
BUN: 10 mg/dL (ref 8–23)
CO2: 24 mmol/L (ref 22–32)
Calcium: 8.2 mg/dL — ABNORMAL LOW (ref 8.9–10.3)
Chloride: 106 mmol/L (ref 98–111)
Creatinine, Ser: 1.26 mg/dL — ABNORMAL HIGH (ref 0.61–1.24)
GFR, Estimated: >60 mL/min (ref >60)
Glucose, Bld: 84 mg/dL (ref 70–99)
Potassium: 3.4 mmol/L — ABNORMAL LOW (ref 3.5–5.1)
Sodium: 141 mmol/L (ref 135–145)

## 2020-11-30 LAB — PATHOLOGIST SMEAR REVIEW

## 2020-11-30 LAB — VDRL, CSF: VDRL Quant, CSF: NONREACTIVE

## 2020-11-30 LAB — CSF CULTURE W GRAM STAIN: Culture: NO GROWTH

## 2020-11-30 MED ORDER — DARUN-COBIC-EMTRICIT-TENOFAF 800-150-200-10 MG PO TABS
1.0000 | ORAL_TABLET | Freq: Every day | ORAL | Status: DC
  Administered 2020-11-30 – 2020-12-01 (×2): 1 via ORAL
  Filled 2020-11-30 (×2): qty 1

## 2020-11-30 MED ORDER — DOLUTEGRAVIR SODIUM 50 MG PO TABS
50.0000 mg | ORAL_TABLET | Freq: Every day | ORAL | Status: DC
  Administered 2020-11-30 – 2020-12-01 (×2): 50 mg via ORAL
  Filled 2020-11-30 (×2): qty 1

## 2020-11-30 MED ORDER — POTASSIUM CHLORIDE CRYS ER 20 MEQ PO TBCR
40.0000 meq | EXTENDED_RELEASE_TABLET | Freq: Once | ORAL | Status: AC
  Administered 2020-11-30: 40 meq via ORAL
  Filled 2020-11-30: qty 2

## 2020-11-30 MED ORDER — GUAIFENESIN-DM 100-10 MG/5ML PO SYRP
5.0000 mL | ORAL_SOLUTION | ORAL | Status: DC | PRN
  Administered 2020-12-01: 5 mL via ORAL
  Filled 2020-11-30: qty 5

## 2020-11-30 NOTE — Evaluation (Signed)
Physical Therapy Evaluation Patient Details Name: Eric Jefferson MRN: [id number] DOB: 06/21/1961 Today's Date: 11/30/2020   History of Present Illness  Pt is a 60 y/o male admitted secondary to weakness and ?AMS. Pt is s/p LP on 1/28. PMH includes HIV, tobacco use, TB, DM, and CKD.  Clinical Impression  Pt admitted secondary to problem above with deficits below. Pt requiring min A for bed mobility and min guard for safety to ambulate in room. Conflicting reports of dizziness, so mobility limited for safety reasons. No family present and did not have dialect for interpreter on [place] interpreter. Feel pt would benefit from HHPT and assist from family. Will continue to follow acutely.     Follow Up Recommendations Home health PT;Supervision/Assistance - 24 hour    Equipment Recommendations  Rolling walker with 5" wheels    Recommendations for Other Services       Precautions / Restrictions Precautions Precautions: Fall Restrictions Weight Bearing Restrictions: No      Mobility  Bed Mobility Overal bed mobility: Needs Assistance Bed Mobility: Supine to Sit;Sit to Supine     Supine to sit: Min assist Sit to supine: Supervision   General bed mobility comments: Min A for trunk elevation. Increased time required.    Transfers Overall transfer level: Needs assistance Equipment used: Rolling walker (2 wheeled) Transfers: Sit to/from Stand Sit to Stand: Min guard         General transfer comment: Min guard for safety. Reporting dizziness initially and required seated rest.  Ambulation/Gait Ambulation/Gait assistance: Min guard Gait Distance (Feet): 15 Feet Assistive device: Rolling walker (2 wheeled) Gait Pattern/deviations: Step-through pattern;Decreased stride length Gait velocity: Decreased   General Gait Details: Guarded gait, but no overt LOB noted with use of RW. Pt reporting dizziness, and then stated he did not have dizziness, so mobility limited secondary to safety  reasons.  Stairs            Wheelchair Mobility    Modified Rankin (Stroke Patients Only)       Balance Overall balance assessment: Needs assistance Sitting-balance support: No upper extremity supported Sitting balance-Leahy Scale: Fair     Standing balance support: Bilateral upper extremity supported Standing balance-Leahy Scale: Poor Standing balance comment: Reliant on UE support                             Pertinent Vitals/Pain Pain Assessment: No/denies pain    Home Living Family/patient expects to be discharged to:: Private residence Living Arrangements: Spouse/significant other;Children Available Help at Discharge: Family Type of Home: House Home Access: Level entry     Home Layout: One level Home Equipment: None      Prior Function Level of Independence: Independent         Comments: Pt reports he was independent with mobility     Hand Dominance        Extremity/Trunk Assessment   Upper Extremity Assessment Upper Extremity Assessment: Defer to OT evaluation    Lower Extremity Assessment Lower Extremity Assessment: Generalized weakness    Cervical / Trunk Assessment Cervical / Trunk Assessment: Normal  Communication   Communication: Prefers language other than English (speaks/understands english fairly well; did not have dialect on ipad)  Cognition Arousal/Alertness: Awake/alert Behavior During Therapy: WFL for tasks assessed/performed Overall Cognitive Status: No family/caregiver present to determine baseline cognitive functioning  General Comments: Somewhat slowed process, but overall appropriate throughout.      General Comments      Exercises     Assessment/Plan    PT Assessment Patient needs continued PT services  PT Problem List Decreased strength;Decreased balance;Decreased mobility;Decreased activity tolerance;Decreased knowledge of use of DME;Decreased  knowledge of precautions       PT Treatment Interventions DME instruction;Gait training;Therapeutic activities;Functional mobility training;Stair training;Balance training;Therapeutic exercise;Patient/family education    PT Goals (Current goals can be found in the Care Plan section)  Acute Rehab PT Goals Patient Stated Goal: none stated PT Goal Formulation: With patient Time For Goal Achievement: 12/14/20 Potential to Achieve Goals: Good    Frequency Min 3X/week   Barriers to discharge        Co-evaluation               AM-PAC PT "6 Clicks" Mobility  Outcome Measure Help needed turning from your back to your side while in a flat bed without using bedrails?: A Little Help needed moving from lying on your back to sitting on the side of a flat bed without using bedrails?: A Little Help needed moving to and from a bed to a chair (including a wheelchair)?: A Little Help needed standing up from a chair using your arms (e.g., wheelchair or bedside chair)?: A Little Help needed to walk in hospital room?: A Little Help needed climbing 3-5 steps with a railing? : A Lot 6 Click Score: 17    End of Session Equipment Utilized During Treatment: Gait belt Activity Tolerance: Treatment limited secondary to medical complications (Comment) (dizziness) Patient left: in bed;with call bell/phone within reach;with bed alarm set Nurse Communication: Mobility status PT Visit Diagnosis: Muscle weakness (generalized) (M62.81)    Time: 1517-6160 PT Time Calculation (min) (ACUTE ONLY): 18 min   Charges:   PT Evaluation $PT Eval Moderate Complexity: 1 Mod          Lou Miner, DPT  Acute Rehabilitation Services  Pager: [phone] Office: [phone]   Rudean Hitt 11/30/2020, 4:03 PM

## 2020-11-30 NOTE — Progress Notes (Signed)
PROGRESS NOTE    Eric Jefferson  [id number] DOB: 01-29-1961 DOA: 11/25/2020 PCP: [name], MD   Chief Complain: Altered mental status  Brief Narrative: Patient is a 59 year old [place] speaking male with history of HIV on therapy , previous pulmonary TB, hyperlipidemia, CKD, tobacco abuse who presents with 3-day history of weakness,questinable AMS. There was report of fever  at home along with headache, chills, nonproductive cough. On presentation he was afebrile. Blood pressure was elevated. CT imaging of the brain showed maxillary and sphenoid sinusitis. Liver enzymes were mild elevated. Covid screen test was negative. Chest x-ray showed emphysematous changes. MRI of the brain did not show any acute intracranial normalities.CNS viral  infection suspected. Neurology and ID consulted. Started on acyclovir empirically. Underwent lumbar puncture by IR on 11/27/20.  Mental status is improving an close to baseline.  Plan for discharge tomorrow  Assessment & Plan:   Principal Problem:   Acute metabolic encephalopathy Active Problems:   Human immunodeficiency virus (HIV) disease ([place])   TOBACCO ABUSE   Transaminitis   Altered mental status/weakness:Suspected to be from CNS infection on presentation..  Started on acyclovir empirically. Neurology attempted lumbar puncture at bedside but was unsuccessful. Underwent  fluoroscopy guided LP.  CSF analysis showed WBC count of 14( lymphocytic predominance) elevated protein.  Culture so far negative, no organisms seen. Cryptococcal antigen negative. Negative CSF  HSV PCR, pending VDRL On presentation, patient was thought to be confused , not following commands, did not speak. There was report of headache, fever at home.  Family later said he was never confused but was weak . brain imagings did not show an acute intracranial abnormalities. UDS was negative. ID, neurology were following. Started on low-dose Seroquel which will be stopped because he  is more sleepy. TSH,vitamin B12 normal EEG did not show any seizures . MRI C-spine and T-spine were ordered by neurology which did not show any acute pathologies.   His mental status had significantly improved.  He was more communicative,     Started on dysphagia 3 diet, eating well.  As per family he is oriented. Patient has flat affect. We have requested PT/OT evaluation.  History of HIV: On tivicay and Symtuza. Follows with Dr. [name], ID.  CD4 of 131.  Restarting his  HIV meds .Viral load is significant with 414, 000.  Most likely was noncompliant.  He will follow-up with Dr. [name] on discharge.  Transaminitis: Mild. Continue to monitor.Negative  hepatitis panel  History of pulmonary tuberculosis: No active issues  Tobacco use: Currently smoking  AKI/hypokalemia: Continue gentle IV fluids and potassium supplementation.  Check BMP tomorrow.         DVT prophylaxis:SCD Code Status: Full Family Communication: Discussed with son at bedside  On 11/29/20 status is: Inpatient  Remains inpatient appropriate because:Inpatient level of care appropriate due to severity of illness   Dispo: The patient is from: Home              Anticipated d/c is to: Home              Anticipated d/c date IS:[id number]              Patient currently is not medically stable to d/c.   Difficult to place patient No      Consultants: Neurology, ID  Procedures:None  Antimicrobials:  Anti-infectives (From admission, onward)   Start     Dose/Rate Route Frequency Ordered Stop   11/26/20 2030  acyclovir (ZOVIRAX) 500 mg in dextrose  5 % 100 mL IVPB        500 mg 110 mL/hr over 60 Minutes Intravenous Every 8 hours 11/26/20 1917        Subjective:  Patient seen and examined the bedside this morning.  Hemodynamically stable.  Looks comfortable.  He was more alert and awake and communicated with me today.  He told me that he is in the hospital.  He knows that the current year is 2022 but he does not  know the month.   Objective: Vitals:   11/29/20 1548 11/29/20 2054 11/29/20 2329 11/30/20 0435  BP: 112/68 99/68 93/67  98/66  Pulse: 87 74 76 70  Resp: 18 16 14 16   Temp: 97.9 F (36.6 C) 98.9 F (37.2 C) 99.5 F (37.5 C) (!) 97.5 F (36.4 C)  TempSrc: Oral Oral Oral Oral  SpO2: 95% 95% 93% 95%  Weight:      Height:        Intake/Output Summary (Last 24 hours) at 11/30/2020 0801 Last data filed at 11/29/2020 1304 Gross per 24 hour  Intake -  Output 550 ml  Net -550 ml   Filed Weights   11/26/20 1900  Weight: 54 kg    Examination:  General exam: Appears calm and comfortable ,Not in distress,average built HEENT:PERRL,Oral mucosa moist, Ear/Nose normal on gross exam Respiratory system: Bilateral equal air entry, normal vesicular breath sounds, no wheezes or crackles  Cardiovascular system: S1 & S2 heard, RRR. No JVD, murmurs, rubs, gallops or clicks. Gastrointestinal system: Abdomen is nondistended, soft and nontender. No organomegaly or masses felt. Normal bowel sounds heard. Central nervous system: Alert and oriented to place Extremities: No edema, no clubbing ,no cyanosis Skin: No rashes, lesions or ulcers,no icterus ,no pallor Psychiatry: flat effect.     Data Reviewed: I have personally reviewed following labs and imaging studies  CBC: Recent Labs  Lab 11/25/20 2234 11/27/20 0731 11/30/20 0204  WBC 5.5 4.8 7.6  NEUTROABS  --   --  1.5*  HGB 15.5 14.2 13.4  HCT 45.8 41.0 38.3*  MCV 85.4 84.5 83.1  PLT 162 184 478   Basic Metabolic Panel: Recent Labs  Lab 11/25/20 2234 11/27/20 0731 11/28/20 0127 11/30/20 0204  NA 138 139 138 141  K 4.3 3.8 3.7 3.4*  CL 106 105 106 106  CO2 21* 22 21* 24  GLUCOSE 101* 114* 90 84  BUN 17 18 13 10   CREATININE 1.00 1.01 0.96 1.26*  CALCIUM 8.7* 8.6* 8.5* 8.2*   GFR: Estimated Creatinine Clearance: 47 mL/min (A) (by C-G formula based on SCr of 1.26 mg/dL (H)). Liver Function Tests: Recent Labs  Lab  11/26/20 0700 11/27/20 0731 11/28/20 0127  AST 79* 73* 61*  ALT 57* 57* 53*  ALKPHOS 71 70 65  BILITOT 0.9 0.7 0.9  PROT 6.7 6.5 6.7  ALBUMIN 3.4* 3.2* 3.3*   No results for input(s): LIPASE, AMYLASE in the last 168 hours. Recent Labs  Lab 11/26/20 0700  AMMONIA 16   Coagulation Profile: Recent Labs  Lab 11/26/20 1440  INR 1.0   Cardiac Enzymes: Recent Labs  Lab 11/26/20 0700  CKTOTAL 108   BNP (last 3 results) No results for input(s): PROBNP in the last 8760 hours. HbA1C: No results for input(s): HGBA1C in the last 72 hours. CBG: Recent Labs  Lab 11/26/20 0754  GLUCAP 87   Lipid Profile: No results for input(s): CHOL, HDL, LDLCALC, TRIG, CHOLHDL, LDLDIRECT in the last 72 hours. Thyroid Function Tests: No results  for input(s): TSH, T4TOTAL, FREET4, T3FREE, THYROIDAB in the last 72 hours. Anemia Panel: No results for input(s): VITAMINB12, FOLATE, FERRITIN, TIBC, IRON, RETICCTPCT in the last 72 hours. Sepsis Labs: Recent Labs  Lab 11/25/20 2234 11/26/20 0700  LATICACIDVEN 1.1 1.5    Recent Results (from the past 240 hour(s))  SARS Coronavirus 2 by RT PCR (hospital order, performed in [facility] hospital lab) Nasopharyngeal Nasopharyngeal Swab     Status: None   Collection Time: 11/25/20 11:26 PM   Specimen: Nasopharyngeal Swab  Result Value Ref Range Status   SARS Coronavirus 2 NEGATIVE NEGATIVE Final    Comment: (NOTE) SARS-CoV-2 target nucleic acids are NOT DETECTED.  The SARS-CoV-2 RNA is generally detectable in upper and lower respiratory specimens during the acute phase of infection. The lowest concentration of SARS-CoV-2 viral copies this assay can detect is 250 copies / mL. A negative result does not preclude SARS-CoV-2 infection and should not be used as the sole basis for treatment or other patient management decisions.  A negative result may occur with improper specimen collection / handling, submission of specimen other than  nasopharyngeal swab, presence of viral mutation(s) within the areas targeted by this assay, and inadequate number of viral copies (<250 copies / mL). A negative result must be combined with clinical observations, patient history, and epidemiological information.  Fact Sheet for Patients:   StrictlyIdeas.no  Fact Sheet for Healthcare Providers: BankingDealers.co.za  This test is not yet approved or  cleared by the [place] FDA and has been authorized for detection and/or diagnosis of SARS-CoV-2 by FDA under an Emergency Use Authorization (EUA).  This EUA will remain in effect (meaning this test can be used) for the duration of the COVID-19 declaration under Section 564(b)(1) of the Act, 21 U.S.C. section 360bbb-3(b)(1), unless the authorization is terminated or revoked sooner.  Performed at [facility], [place] [address], [place], [place] [address]   Respiratory (~20 pathogens) panel by PCR     Status: None   Collection Time: 11/26/20  2:17 PM   Specimen: Nasopharyngeal Swab; Respiratory  Result Value Ref Range Status   Adenovirus NOT DETECTED NOT DETECTED Final   Coronavirus 229E NOT DETECTED NOT DETECTED Final    Comment: (NOTE) The Coronavirus on the Respiratory Panel, DOES NOT test for the novel  Coronavirus (2019 nCoV)    Coronavirus HKU1 NOT DETECTED NOT DETECTED Final   Coronavirus NL63 NOT DETECTED NOT DETECTED Final   Coronavirus OC43 NOT DETECTED NOT DETECTED Final   Metapneumovirus NOT DETECTED NOT DETECTED Final   Rhinovirus / Enterovirus NOT DETECTED NOT DETECTED Final   Influenza A NOT DETECTED NOT DETECTED Final   Influenza B NOT DETECTED NOT DETECTED Final   Parainfluenza Virus 1 NOT DETECTED NOT DETECTED Final   Parainfluenza Virus 2 NOT DETECTED NOT DETECTED Final   Parainfluenza Virus 3 NOT DETECTED NOT DETECTED Final   Parainfluenza Virus 4 NOT DETECTED NOT DETECTED Final   Respiratory Syncytial Virus  NOT DETECTED NOT DETECTED Final   Bordetella pertussis NOT DETECTED NOT DETECTED Final   Bordetella Parapertussis NOT DETECTED NOT DETECTED Final   Chlamydophila pneumoniae NOT DETECTED NOT DETECTED Final   Mycoplasma pneumoniae NOT DETECTED NOT DETECTED Final    Comment: Performed at [facility], [place]. [address], [place], [place] [address]  CSF culture     Status: None (Preliminary result)   Collection Time: 11/27/20  9:38 AM   Specimen: PATH Cytology CSF; Cerebrospinal Fluid  Result Value Ref  Range Status   Specimen Description CSF  Final   Special Requests NONE  Final   Gram Stain   Final    WBC PRESENT, PREDOMINANTLY MONONUCLEAR NO ORGANISMS SEEN CYTOSPIN SMEAR    Culture   Final    NO GROWTH 2 DAYS Performed at [facility], [address] [place], [place] [address]    Report Status PENDING  Incomplete         Radiology Studies: MR CERVICAL SPINE W WO CONTRAST  Result Date: 11/29/2020 CLINICAL DATA:  Three days of weakness. Acute or progressive myelopathy. History of HIV on treatment. EXAM: MRI CERVICAL SPINE WITHOUT AND WITH CONTRAST TECHNIQUE: Multiplanar and multiecho pulse sequences of the cervical spine, to include the craniocervical junction and cervicothoracic junction, were obtained without and with intravenous contrast. CONTRAST:  5.22mL GADAVIST GADOBUTROL 1 MMOL/ML IV SOLN COMPARISON:  Cervical spine CT 06/17/2016 FINDINGS: Alignment: Physiologic. Vertebrae: No fracture, evidence of discitis, or bone lesion. Cord: Normal signal and morphology. On a few images the central canal is visible, within normal limits. The no abnormal spinal canal enhancement. Posterior Fossa, vertebral arteries, paraspinal tissues: Negative Disc levels: Mild disc narrowing and bulging most notable at C4-5 and C5-6 where there is also uncovertebral ridging that causes foraminal impingement on the right at C4-5 and left at C5-6. No degenerative cord compression. IMPRESSION: 1. Normal  MRI of the cervical cord. 2. Degenerative foraminal impingement on the right at C4-5 and left at C5-6. Electronically Signed   By: Monte Fantasia M.D.   On: 11/29/2020 09:17   MR THORACIC SPINE W WO CONTRAST  Result Date: 11/29/2020 CLINICAL DATA:  Three days of weakness. Acute or progressive myelopathy. History of HIV on treatment. EXAM: MRI THORACIC WITHOUT AND WITH CONTRAST TECHNIQUE: Multiplanar and multiecho pulse sequences of the thoracic spine were obtained without and with intravenous contrast. CONTRAST:  5.51mL GADAVIST GADOBUTROL 1 MMOL/ML IV SOLN COMPARISON:  None. FINDINGS: Alignment:  Physiologic. Vertebrae: No fracture, evidence of discitis, or bone lesion. Cord: Normal signal and morphology. No abnormal intrathecal enhancement Paraspinal and other soft tissues: No visible mass or inflammation about the spine. Disc levels: No significant degenerative changes, especially for age. IMPRESSION: Normal MRI of the thoracic cord. Electronically Signed   By: Monte Fantasia M.D.   On: 11/29/2020 09:14        Scheduled Meds: . potassium chloride  40 mEq Oral Once  . QUEtiapine  25 mg Oral QHS  . sodium chloride flush  3 mL Intravenous Q12H   Continuous Infusions: . acyclovir 500 mg (11/30/20 0644)  . lactated ringers 75 mL/hr at 11/30/20 0612     LOS: 4 days    Time spent: 25  mins.More than 50% of that time was spent in counseling and/or coordination of care.      Shelly Coss, MD [facility] P1/31/2022, 8:01 AM

## 2020-11-30 NOTE — Evaluation (Signed)
Occupational Therapy Evaluation Patient Details Name: Eric Jefferson MRN: [id number] DOB: 08-Jul-1961 Today's Date: 11/30/2020    History of Present Illness Pt is a 60 y/o male admitted secondary to weakness and ?AMS. Pt is s/p LP on 1/28. PMH includes HIV, tobacco use, TB, DM, and CKD.   Clinical Impression   Pt PTA: pt living with family at home and reports independence prior. Pt currently, minguardA overall for ADL and minguardA overall for ADL functional transfers and mobility. Pt standing at sink x5 mind for light ADL. Pt would have brief moments of staring, but would answer to name and did not lose consciousness. ?language barrier as dialect is not found on interpreter machine and no family in room. Pt following simple commands and answering occasionally in English. Pt would benefit from continued OT skilled services for ADL, mobility and safety. OT following acutely.     Follow Up Recommendations  No OT follow up;Supervision/Assistance - 24 hour    Equipment Recommendations  None recommended by OT    Recommendations for Other Services       Precautions / Restrictions Precautions Precautions: Fall Restrictions Weight Bearing Restrictions: No      Mobility Bed Mobility Overal bed mobility: Needs Assistance Bed Mobility: Supine to Sit;Sit to Supine     Supine to sit: Supervision Sit to supine: Supervision   General bed mobility comments: Pt using rail and increased time req    Transfers Overall transfer level: Needs assistance Equipment used: Rolling walker (2 wheeled) Transfers: Sit to/from Stand Sit to Stand: Min guard         General transfer comment: Min guard for safety. Reporting dizziness initially and required seated rest.    Balance Overall balance assessment: Needs assistance Sitting-balance support: No upper extremity supported Sitting balance-Leahy Scale: Fair     Standing balance support: Bilateral upper extremity supported Standing balance-Leahy  Scale: Poor Standing balance comment: Reliant on UE support                           ADL either performed or assessed with clinical judgement   ADL Overall ADL's : Needs assistance/impaired Eating/Feeding: Set up;Sitting   Grooming: Supervision/safety;Standing   Upper Body Bathing: Supervision/ safety;Standing;Set up;Sitting Upper Body Bathing Details (indicate cue type and reason): fatigues easily Lower Body Bathing: Min guard;Sit to/from stand;Set up;Sitting/lateral leans   Upper Body Dressing : Set up;Sitting   Lower Body Dressing: Min guard;Sitting/lateral leans;Sit to/from stand   Toilet Transfer: Supervision/safety;Ambulation   Toileting- Clothing Manipulation and Hygiene: Min guard;Sitting/lateral lean;Sit to/from stand       Functional mobility during ADLs: Min guard;Cueing for safety General ADL Comments: pt limited by decreased strength, decreased processing speed and increased need to take rest breaks. Unablet o find correct dialect interpreter so getstures and pt appeared to comprehend some [place].     Vision Baseline Vision/History: No visual deficits Patient Visual Report: No change from baseline Additional Comments: Attempting to assess; avoided obstacles, but pt could not follow OTRs hand when asked several times. [place] language barrier.     Perception     Praxis      Pertinent Vitals/Pain Pain Assessment: No/denies pain     Hand Dominance Right   Extremity/Trunk Assessment Upper Extremity Assessment Upper Extremity Assessment: Generalized weakness;LUE deficits/detail LUE Deficits / Details: LUE shoulder ROM decreased and appears arthritic changes at shoulder and elbow LUE Coordination: decreased fine motor;decreased gross motor   Lower Extremity Assessment Lower Extremity Assessment: Generalized  weakness   Cervical / Trunk Assessment Cervical / Trunk Assessment: Normal   Communication Communication Communication: Prefers  language other than English (speaks/understands english fairly well; did not have dialect on ipad)   Cognition Arousal/Alertness: Awake/alert Behavior During Therapy: WFL for tasks assessed/performed Overall Cognitive Status: No family/caregiver present to determine baseline cognitive functioning                                 General Comments: Pt following simple commands with increased time for processing. Language barrier as video does not have his dialect.   General Comments  VSS; HR 102 BPM with exertion    Exercises     Shoulder Instructions      Home Living Family/patient expects to be discharged to:: Private residence Living Arrangements: Spouse/significant other;Children Available Help at Discharge: Family Type of Home: House Home Access: Level entry     Home Layout: One level     Bathroom Shower/Tub: Teacher, early years/pre: Standard     Home Equipment: None          Prior Functioning/Environment Level of Independence: Independent        Comments: Pt reports he was independent with mobility        OT Problem List: Decreased strength;Decreased activity tolerance;Impaired balance (sitting and/or standing);Impaired vision/perception;Decreased coordination;Decreased cognition;Decreased safety awareness;Pain;Increased edema;Impaired UE functional use;Cardiopulmonary status limiting activity      OT Treatment/Interventions: Self-care/ADL training;Therapeutic exercise;Energy conservation;DME and/or AE instruction;Therapeutic activities;Visual/perceptual remediation/compensation;Patient/family education;Cognitive remediation/compensation;Balance training;Neuromuscular education    OT Goals(Current goals can be found in the care plan section) Acute Rehab OT Goals Patient Stated Goal: none stated OT Goal Formulation: With patient Time For Goal Achievement: 12/14/20 Potential to Achieve Goals: Good ADL Goals Additional ADL Goal #1: Pt  will participate in ADL tasks x10 mins with 2 rest breaks to increase activity tolerance. Additional ADL Goal #2: Pt will participate in higher level cognitive ADL/iADL task in order to further assess deficits.  OT Frequency: Min 2X/week   Barriers to D/C:            Co-evaluation              AM-PAC OT "6 Clicks" Daily Activity     Outcome Measure Help from another person eating meals?: None Help from another person taking care of personal grooming?: A Little Help from another person toileting, which includes using toliet, bedpan, or urinal?: A Little Help from another person bathing (including washing, rinsing, drying)?: A Little Help from another person to put on and taking off regular upper body clothing?: A Little Help from another person to put on and taking off regular lower body clothing?: A Little 6 Click Score: 19   End of Session Nurse Communication: Mobility status  Activity Tolerance: Patient tolerated treatment well Patient left: in bed;with call bell/phone within reach;with bed alarm set  OT Visit Diagnosis: Unsteadiness on feet (R26.81);Muscle weakness (generalized) (M62.81);Other symptoms and signs involving cognitive function                Time: 1610-9604 OT Time Calculation (min): 18 min Charges:  OT General Charges $OT Visit: 1 Visit OT Evaluation $OT Eval Moderate Complexity: 1 Mod  Jefferey Pica, OTR/L Acute Rehabilitation Services Pager: [phone] Office: [phone]   QMVHQIO C 11/30/2020, 5:22 PM

## 2020-11-30 NOTE — Progress Notes (Signed)
[facility]    Date of Admission:  11/25/2020   Total days of antibiotics 2/acyclovir   ID: Eric Jefferson is a 60 y.o. male with   Principal Problem:   Acute metabolic encephalopathy Active Problems:   Human immunodeficiency virus (HIV) disease ([place])   TOBACCO ABUSE   Transaminitis    Subjective: Realized today patient speaks some vietnamese as well  I conversed with him in [place] No confusion Feeling a lot better than before admission  Some frontal headache/abd pain, and lack of appetite but these are improving (new sx this illness)  He reports he doesn't missed many doses of his ART, usually 1-2 doses a week. However his viral load is very high 414k  Labs reviewed; csf hsv pcr negative  Medications:  . Darunavir-Cobicisctat-Emtricitabine-Tenofovir Alafenamide  1 tablet Oral Q breakfast  . dolutegravir  50 mg Oral Daily  . QUEtiapine  25 mg Oral QHS  . sodium chloride flush  3 mL Intravenous Q12H    Objective: Vital signs in last 24 hours: Temp:  [97.5 F (36.4 C)-99.5 F (37.5 C)] 99.4 F (37.4 C) (01/31 0824) Pulse Rate:  [63-87] 83 (01/31 0824) Resp:  [14-19] 17 (01/31 0824) BP: (93-117)/(66-74) 98/68 (01/31 0824) SpO2:  [93 %-96 %] 93 % (01/31 0824) Physical Exam  General: conversant; tracking/following commands; no distress; watching youtube HEENT: oropahrynx clear; conj clear; eomi CV: rrr no mrg Lungs: clear abd s/nt Ext no edema Skin no rash Neuro: nonfocal Psych: alert, oriented   Lab Results Recent Labs    11/28/20 0127 11/30/20 0204  WBC  --  7.6  HGB  --  13.4  HCT  --  38.3*  NA 138 141  K 3.7 3.4*  CL 106 106  CO2 21* 24  BUN 13 10  CREATININE 0.96 1.26*   Liver Panel Recent Labs    11/28/20 0127  PROT 6.7  ALBUMIN 3.3*  AST 61*  ALT 53*  ALKPHOS 65  BILITOT 0.9    Lab Results  Component Value Date   HIV1RNAQUANT 414,000 11/28/2020   HIV1RNAQUANT <20 NOT DETECTED 04/09/2020    HIV1RNAQUANT <20 DETECTED (A) 12/23/2019   Lab Results  Component Value Date   CD4TABS 131 (L) 11/26/2020   CD4TABS 863 04/09/2020   CD4TABS 485 12/23/2019    Serology: rpr negative Csf hsv pcr negative Csf vzv igg pending Csf CrAg negative Csf vdrl pending Acute hepatitis panel negative   Microbiology: 1/28 Csf cx negative  Studies/Results: MR CERVICAL SPINE W WO CONTRAST  Result Date: 11/29/2020 CLINICAL DATA:  Three days of weakness. Acute or progressive myelopathy. History of HIV on treatment. EXAM: MRI CERVICAL SPINE WITHOUT AND WITH CONTRAST TECHNIQUE: Multiplanar and multiecho pulse sequences of the cervical spine, to include the craniocervical junction and cervicothoracic junction, were obtained without and with intravenous contrast. CONTRAST:  5.54mL GADAVIST GADOBUTROL 1 MMOL/ML IV SOLN COMPARISON:  Cervical spine CT 06/17/2016 FINDINGS: Alignment: Physiologic. Vertebrae: No fracture, evidence of discitis, or bone lesion. Cord: Normal signal and morphology. On a few images the central canal is visible, within normal limits. The no abnormal spinal canal enhancement. Posterior Fossa, vertebral arteries, paraspinal tissues: Negative Disc levels: Mild disc narrowing and bulging most notable at C4-5 and C5-6 where there is also uncovertebral ridging that causes foraminal impingement on the right at C4-5 and left at C5-6. No degenerative cord compression. IMPRESSION: 1. Normal MRI of the cervical cord. 2. Degenerative foraminal impingement on the right at C4-5  and left at C5-6. Electronically Signed   By: Monte Fantasia M.D.   On: 11/29/2020 09:17   MR THORACIC SPINE W WO CONTRAST  Result Date: 11/29/2020 CLINICAL DATA:  Three days of weakness. Acute or progressive myelopathy. History of HIV on treatment. EXAM: MRI THORACIC WITHOUT AND WITH CONTRAST TECHNIQUE: Multiplanar and multiecho pulse sequences of the thoracic spine were obtained without and with intravenous contrast. CONTRAST:   5.73mL GADAVIST GADOBUTROL 1 MMOL/ML IV SOLN COMPARISON:  None. FINDINGS: Alignment:  Physiologic. Vertebrae: No fracture, evidence of discitis, or bone lesion. Cord: Normal signal and morphology. No abnormal intrathecal enhancement Paraspinal and other soft tissues: No visible mass or inflammation about the spine. Disc levels: No significant degenerative changes, especially for age. IMPRESSION: Normal MRI of the thoracic cord. Electronically Signed   By: Monte Fantasia M.D.   On: 11/29/2020 09:14    Imaging: 1/27 mri brain No focal enhancement/abnormality  Assessment/Plan: 60 yo male well controlled hiv admitted for generalized weakness/decreased appetite and ?confusion  Distant hx pulm tb treated     #ams On clarification 11/28/20 with his family (son), patient never had confusion (although subjective consideration could be different).  His Csf with mild protein elevation but normal glucose, and only 7 wbc covid negative Csf Crypto ag, hepatitis serology negative (slight lft elevation); respiratory viral pcr negative; rpr negative 1/27 mri brain no enhancement or acute abnormality hsv pcr from csf negative  Mild headache (improving). Again unclear if he ever has confusion Low suspicion for cmv, ebv, jc virus related process  -stop acyclovir -f/u sent out vzv igg and hsv pcr in csf -doesn't need to be here for that  #HIV disease Well controlled previously on tivicay/symtuza Low cd4 this admission including percentage ?related to acute infection; previously low-mid 20% and 863 absolute on 03/2020; vl also high 414k He is adament about having been rather compliant, although unusual for acute infection to acount for vl this high. Suspect compliant issue  -resume tivicay/symtuza today  -F/u dr [name] 2/8 @ [facility] to discharge from id standpoint  [facility]  11/30/2020, 9:46 AM

## 2020-12-01 LAB — BASIC METABOLIC PANEL
Anion gap: 13 (ref 5–15)
BUN: 11 mg/dL (ref 8–23)
CO2: 22 mmol/L (ref 22–32)
Calcium: 8.6 mg/dL — ABNORMAL LOW (ref 8.9–10.3)
Chloride: 104 mmol/L (ref 98–111)
Creatinine, Ser: 1.31 mg/dL — ABNORMAL HIGH (ref 0.61–1.24)
GFR, Estimated: >60 mL/min (ref >60)
Glucose, Bld: 69 mg/dL — ABNORMAL LOW (ref 70–99)
Potassium: 3.9 mmol/L (ref 3.5–5.1)
Sodium: 139 mmol/L (ref 135–145)

## 2020-12-01 NOTE — TOC Transition Note (Signed)
Transition of Care (TOC) - CM/SW Discharge Note   Patient Details  Name: Eric Jefferson MRN: [id number] Date of Birth: May 29, 1961  Transition of Care [facility]) CM/SW Contact:  Pollie Friar, RN Phone Number: 12/01/2020, 11:16 AM   Clinical Narrative:    Pt discharging home with family. [place] arranged through [facility].  [place] for home delivered to the room per [facility].  No new medications. PCP arranged through [facility]. Information on the AVS. Daughter to provide transport home.   Final next level of care: [place] Barriers to Discharge: Inadequate or no insurance,Barriers Unresolved (comment)   Patient Goals and CMS Choice        Discharge Placement                       Discharge Plan and Services                DME Arranged: [place] rolling DME Agency: [facility] Date DME Agency Contacted: 12/01/20   Representative spoke with at DME Agency: Sheila--charity DME [place] Arranged: PT [place] Agency: [facility] [place] Date [place]: 12/01/20   Representative spoke with at [place]: Amy--charity services  Social Determinants of Health ([place]) Interventions     Readmission Risk Interventions No flowsheet data found.

## 2020-12-01 NOTE — Discharge Summary (Signed)
Physician Discharge Summary  Cuthbert Turton YDX:[id number] DOB: [birth year] DOA: 11/25/2020  PCP: [name], MD  Admit date: 11/25/2020 Discharge date: 12/01/2020  Admitted From: Home Disposition:  Home  Discharge Condition:Stable CODE STATUS:FULL Diet recommendation:  Regular  Brief/Interim Summary:  Patient is a 60 year old [place] speaking male with history of HIV on therapy , previous pulmonary TB, hyperlipidemia, CKD, tobacco abuse who presents with 3-day history of weakness,questinable AMS. There was report of fever  at home along with headache, chills, nonproductive cough. On presentation he was afebrile. Blood pressure was elevated. CT imaging of the brain showed maxillary and sphenoid sinusitis. Liver enzymes were mild elevated. Covid screen test was negative. Chest x-ray showed emphysematous changes. MRI of the brain did not show any acute intracranial normalities.CNS viral  infection suspected. Neurology and ID consulted. Started on acyclovir empirically. Underwent lumbar puncture by IR on 11/27/20.    CSF studies did not strongly suggest CNS infection so acyclovir discontinued.  His mental status has returned to baseline.  He is medically stable for discharge home today.  Following problems were addressed during his hospitalization:   Altered mental status/weakness:Suspected to be from CNS infection on presentation. Started on acyclovir empirically. Neurology attempted lumbar puncture at bedside but was unsuccessful. Underwent  fluoroscopy guided LP.  CSF analysis showed WBC count of 14( lymphocytic predominance) elevated protein.  Culture so far negative, no organisms seen. Cryptococcal antigen negative. Negative CSF  HSV PCR, pending VDRL On presentation, patient was thought to be confused , not following commands, did not speak. There was report of headache, fever at home.  Family later said he was never confused but was weak .brain imagings did not show an acute intracranial  abnormalities. UDS was negative. ID, neurology were following. Started on low-dose Seroquel which will be stopped because he is more sleepy. TSH,vitamin B12 normal EEG did not show any seizures . MRI C-spine and T-spine were ordered by neurology which did not show any acute pathologies.   His mental status had significantly improved.As per family he is oriented. Today he was at baseline, he knew that today its his birthday. PT recommended HHPT.  History of HIV: On tivicay and Symtuza. Follows with Dr. [name], ID.  CD4 of 131.  Restarting his  HIV meds .Viral load is significant with 414, 000.  Patient denied noncompliance.  He will follow-up with Dr. [name] on discharge.  Transaminitis: Mild. Continue to monitor.Negative  hepatitis panel  History of pulmonary tuberculosis: No active issues  Tobacco use: Currently smoking.Counselled for cessation  AKI/hypokalemia: Hypokalemia resolved  He has mild insufficiency with creatinine in the range of 1.3..  Will recommend to follow-up with PCP in a week and do a BMP test    discharge Diagnoses:  Principal Problem:   Acute metabolic encephalopathy Active Problems:   AIDS (acquired immune deficiency syndrome) ([place])   TOBACCO ABUSE   Transaminitis    Discharge Instructions  Discharge Instructions    Diet general   Complete by: As directed    Discharge instructions   Complete by: As directed    1)Please continue taking your home medications 2)Follow up with PCP on 12/11/20 at 9:30 am. Do a BMP test during the follow up 3)Follow up with your HIV doctor   Increase activity slowly   Complete by: As directed      Allergies as of 12/01/2020      Reactions   Soy Allergy    Upset stomach only- no other issues  Medication List    TAKE these medications   famotidine 40 MG tablet Commonly known as: PEPCID Take 1 tablet (40 mg total) by mouth daily.   Symtuza 800-150-200-10 MG Tabs Generic drug:  Darunavir-Cobicisctat-Emtricitabine-Tenofovir Alafenamide Take 1 tablet by mouth daily with breakfast. Take with Tivicay.   Tivicay 50 MG tablet Generic drug: dolutegravir Take 1 tablet (50 mg total) by mouth daily. Take with Symtuza.            Durable Medical Equipment  (From admission, onward)         Start     Ordered   12/01/20 0816  For home use only DME Walker rolling  Once       Question Answer Comment  Walker: With 5 Inch Wheels   Patient needs a walker to treat with the following condition Balance disorder      12/01/20 0815          Follow-up Information    [facility] Follow up on 12/11/2020.   Specialty: Family Medicine Why: Your appointment is at 9:30 am. Please arrive early and bring a picture ID and your current medications. Contact information: [place] [phone]       [facility] Follow up.   Specialty: [place] Why: The home health agency will contact you for the first appointment Contact information: [address] [place] [phone]              Allergies  Allergen Reactions  . Soy Allergy     Upset stomach only- no other issues    Consultations:  ID,neurology   Procedures/Studies: EEG  Result Date: 11/27/2020 Lora Havens, MD     11/27/2020  4:47 PM Patient Name: Xavius Sangha MRN: [id number] Epilepsy Attending: Lora Havens Referring Physician/Provider: Dr Lesleigh Noe Date: 11/27/2020 Duration: 23.42 mins Patient history: 59yo M with ams. EEG to evaluate for seizure Level of alertness: Awake AEDs during EEG study: None Technical aspects: This EEG study was done with scalp electrodes positioned according to the 10-20 International system of electrode placement. Electrical activity was acquired at a sampling rate of 500Hz  and reviewed with a high frequency filter of 70Hz  and a low frequency filter of 1Hz . EEG  data were recorded continuously and digitally stored. Description: The posterior dominant rhythm consists of 8 Hz activity of moderate voltage (25-35 uV) seen predominantly in posterior head regions, symmetric and reactive to eye opening and eye closing. Hyperventilation and photic stimulation were not performed.   IMPRESSION: This study is within normal limits. No seizures or epileptiform discharges were seen throughout the recording. Lora Havens   CT HEAD WO CONTRAST  Result Date: 11/25/2020 CLINICAL DATA:  Weakness, altered level of consciousness for 3 days, nonverbal EXAM: CT HEAD WITHOUT CONTRAST TECHNIQUE: Contiguous axial images were obtained from the base of the skull through the vertex without intravenous contrast. COMPARISON:  06/17/2016 FINDINGS: Brain: No acute infarct or hemorrhage. Lateral ventricles and midline structures are unremarkable. No acute extra-axial fluid collections. No mass effect. Vascular: No hyperdense vessel or unexpected calcification. Skull: Normal. Negative for fracture or focal lesion. Sinuses/Orbits: Small gas fluid levels within the right maxillary and sphenoid sinuses. Mucoperiosteal thickening throughout the ethmoid air cells. Other: None. IMPRESSION: 1. No acute intracranial process. 2. Right maxillary and sphenoid sinusitis. Electronically Signed   By: Randa Ngo M.D.   On: 11/25/2020 23:13   MR BRAIN WO CONTRAST  Result Date: 11/26/2020 CLINICAL DATA:  60 year old male with weakness. Altered mental status. Cough. EXAM: MRI HEAD WITHOUT CONTRAST TECHNIQUE: Multiplanar, multiecho pulse sequences of the brain and surrounding structures were obtained without intravenous contrast. COMPARISON:  Head CT 11/25/2020 and earlier.  Brain MRI 08/14/2008. FINDINGS: Brain: Study is intermittently degraded by motion artifact despite repeated imaging attempts. Diffusion-weighted imaging is diagnostic and there is no restricted diffusion identified. No midline shift, mass  effect, evidence of mass lesion, ventriculomegaly, extra-axial collection or acute intracranial hemorrhage. Cervicomedullary junction and pituitary are within normal limits. Largely normal for age gray and white matter signal throughout the brain. On SWI there are occasional chronic microhemorrhages in the left hemisphere (series 17, image 43). No cortical encephalomalacia identified. Deep gray nuclei, brainstem and cerebellum appear negative. Vascular: Major intracranial vascular flow voids are preserved. Skull and upper cervical spine: Stable visible cervical spine. Visualized bone marrow signal is within normal limits. Sinuses/Orbits: Negative orbits. Mild right maxillary and sphenoid sinus mucosal thickening is chronic. Other: Mastoids are well aerated. Grossly normal visible internal auditory structures. IMPRESSION: 1. Mildly motion degraded exam with no acute intracranial abnormality. 2. Largely negative for age noncontrast MRI appearance of the brain; occasional chronic micro-hemorrhages suspected in the left hemisphere. Electronically Signed   By: Genevie Ann M.D.   On: 11/26/2020 07:11   MR BRAIN W CONTRAST  Result Date: 11/26/2020 CLINICAL DATA:  Mental status changes. Recent systemic illness. Noncontrast MRI does not show any significant finding. EXAM: MRI HEAD WITH CONTRAST TECHNIQUE: Multiplanar, multiecho pulse sequences of the brain and surrounding structures were obtained with intravenous contrast. CONTRAST:  56mL GADAVIST GADOBUTROL 1 MMOL/ML IV SOLN COMPARISON:  Earlier same day FINDINGS: After contrast administration, there is no abnormal enhancement of the brain or leptomeninges. Major vascular structures show flow. Probable tiny insignificant venous angioma at the left frontoparietal vertex. IMPRESSION: No abnormal enhancement. Electronically Signed   By: Nelson Chimes M.D.   On: 11/26/2020 10:01   MR CERVICAL SPINE W WO CONTRAST  Result Date: 11/29/2020 CLINICAL DATA:  Three days of weakness.  Acute or progressive myelopathy. History of HIV on treatment. EXAM: MRI CERVICAL SPINE WITHOUT AND WITH CONTRAST TECHNIQUE: Multiplanar and multiecho pulse sequences of the cervical spine, to include the craniocervical junction and cervicothoracic junction, were obtained without and with intravenous contrast. CONTRAST:  5.58mL GADAVIST GADOBUTROL 1 MMOL/ML IV SOLN COMPARISON:  Cervical spine CT 06/17/2016 FINDINGS: Alignment: Physiologic. Vertebrae: No fracture, evidence of discitis, or bone lesion. Cord: Normal signal and morphology. On a few images the central canal is visible, within normal limits. The no abnormal spinal canal enhancement. Posterior Fossa, vertebral arteries, paraspinal tissues: Negative Disc levels: Mild disc narrowing and bulging most notable at C4-5 and C5-6 where there is also uncovertebral ridging that causes foraminal impingement on the right at C4-5 and left at C5-6. No degenerative cord compression. IMPRESSION: 1. Normal MRI of the cervical cord. 2. Degenerative foraminal impingement on the right at C4-5 and left at C5-6. Electronically Signed   By: Monte Fantasia M.D.   On: 11/29/2020 09:17   MR THORACIC SPINE W WO CONTRAST  Result Date: 11/29/2020 CLINICAL DATA:  Three days of weakness. Acute or progressive myelopathy. History of HIV on treatment. EXAM: MRI THORACIC WITHOUT AND WITH CONTRAST TECHNIQUE: Multiplanar and multiecho pulse sequences of the thoracic spine were obtained without and with intravenous contrast. CONTRAST:  5.13mL GADAVIST GADOBUTROL 1 MMOL/ML IV SOLN COMPARISON:  None. FINDINGS: Alignment:  Physiologic. Vertebrae: No fracture, evidence of discitis,  or bone lesion. Cord: Normal signal and morphology. No abnormal intrathecal enhancement Paraspinal and other soft tissues: No visible mass or inflammation about the spine. Disc levels: No significant degenerative changes, especially for age. IMPRESSION: Normal MRI of the thoracic cord. Electronically Signed   By:  Monte Fantasia M.D.   On: 11/29/2020 09:14   DG Chest Portable 1 View  Result Date: 11/25/2020 CLINICAL DATA:  Fever and weakness for the past 3 weeks. Cough. Altered mental status for a few days. History of TB and diabetes. EXAM: PORTABLE CHEST 1 VIEW COMPARISON:  09/14/2017 FINDINGS: Slightly shallow inspiration. Heart size and pulmonary vascularity are normal. Emphysematous changes suggested in the lungs. No airspace disease or consolidation. No pleural effusions. No pneumothorax. Mediastinal contours appear intact. IMPRESSION: Emphysematous changes in the lungs. No evidence of active pulmonary disease. Electronically Signed   By: Lucienne Capers M.D.   On: 11/25/2020 22:37   DG FL GUIDED LUMBAR PUNCTURE  Result Date: 11/27/2020 CLINICAL DATA:  Altered mental status EXAM: DIAGNOSTIC LUMBAR PUNCTURE UNDER FLUOROSCOPIC GUIDANCE FLUOROSCOPY TIME:  Fluoroscopy Time:  12 seconds Radiation Exposure Index (if provided by the fluoroscopic device): 0.60 mGy PROCEDURE: Informed consent was obtained from the patient prior to the procedure, including potential complications of headache, allergy, and pain. With the patient prone, the lower back was prepped with Betadine. 1% Lidocaine was used for local anesthesia. Lumbar puncture was performed at the L4-L5 level using a 22 gauge needle with return of clear CSF. Fourteen ml of CSF were obtained for laboratory studies. The patient tolerated the procedure well and there were no apparent complications. IMPRESSION: Technically successful fluoroscopic guided lumbar puncture. Electronically Signed   By: Macy Mis M.D.   On: 11/27/2020 10:56       Subjective: Patient seen and examined at the bedside this morning.  Hemodynamically stable for discharge today.  Discharge Exam: Vitals:   12/01/20 0356 12/01/20 0817  BP: 122/73 130/76  Pulse: (!) 52 63  Resp: 17 17  Temp: 98.6 F (37 C) 99.5 F (37.5 C)  SpO2: 93% 96%   Vitals:   11/30/20 2011 11/30/20  2320 12/01/20 0356 12/01/20 0817  BP: 119/78 132/85 122/73 130/76  Pulse: (!) 56 (!) 58 (!) 52 63  Resp: 17 15 17 17   Temp: 98 F (36.7 C) 98.3 F (36.8 C) 98.6 F (37 C) 99.5 F (37.5 C)  TempSrc: Oral Oral Oral Oral  SpO2: 94% 96% 93% 96%  Weight:      Height:        General: Pt is alert, awake, not in acute distress Cardiovascular: RRR, S1/S2 +, no rubs, no gallops Respiratory: CTA bilaterally, no wheezing, no rhonchi Abdominal: Soft, NT, ND, bowel sounds + Extremities: no edema, no cyanosis    The results of significant diagnostics from this hospitalization (including imaging, microbiology, ancillary and laboratory) are listed below for reference.     Microbiology: Recent Results (from the past 240 hour(s))  SARS Coronavirus 2 by RT PCR (hospital order, performed in [facility] hospital lab) Nasopharyngeal Nasopharyngeal Swab     Status: None   Collection Time: 11/25/20 11:26 PM   Specimen: Nasopharyngeal Swab  Result Value Ref Range Status   SARS Coronavirus 2 NEGATIVE NEGATIVE Final    Comment: (NOTE) SARS-CoV-2 target nucleic acids are NOT DETECTED.  The SARS-CoV-2 RNA is generally detectable in upper and lower respiratory specimens during the acute phase of infection. The lowest concentration of SARS-CoV-2 viral copies this assay can detect is 250  copies / mL. A negative result does not preclude SARS-CoV-2 infection and should not be used as the sole basis for treatment or other patient management decisions.  A negative result may occur with improper specimen collection / handling, submission of specimen other than nasopharyngeal swab, presence of viral mutation(s) within the areas targeted by this assay, and inadequate number of viral copies (<250 copies / mL). A negative result must be combined with clinical observations, patient history, and epidemiological information.  Fact Sheet for Patients:   StrictlyIdeas.no  Fact Sheet for  Healthcare Providers: BankingDealers.co.za  This test is not yet approved or  cleared by the [place] FDA and has been authorized for detection and/or diagnosis of SARS-CoV-2 by FDA under an Emergency Use Authorization (EUA).  This EUA will remain in effect (meaning this test can be used) for the duration of the COVID-19 declaration under Section 564(b)(1) of the Act, 21 U.S.C. section 360bbb-3(b)(1), unless the authorization is terminated or revoked sooner.  Performed at [facility], [place] [address], [place], [place] [address]   Respiratory (~20 pathogens) panel by PCR     Status: None   Collection Time: 11/26/20  2:17 PM   Specimen: Nasopharyngeal Swab; Respiratory  Result Value Ref Range Status   Adenovirus NOT DETECTED NOT DETECTED Final   Coronavirus 229E NOT DETECTED NOT DETECTED Final    Comment: (NOTE) The Coronavirus on the Respiratory Panel, DOES NOT test for the novel  Coronavirus (2019 nCoV)    Coronavirus HKU1 NOT DETECTED NOT DETECTED Final   Coronavirus NL63 NOT DETECTED NOT DETECTED Final   Coronavirus OC43 NOT DETECTED NOT DETECTED Final   Metapneumovirus NOT DETECTED NOT DETECTED Final   Rhinovirus / Enterovirus NOT DETECTED NOT DETECTED Final   Influenza A NOT DETECTED NOT DETECTED Final   Influenza B NOT DETECTED NOT DETECTED Final   Parainfluenza Virus 1 NOT DETECTED NOT DETECTED Final   Parainfluenza Virus 2 NOT DETECTED NOT DETECTED Final   Parainfluenza Virus 3 NOT DETECTED NOT DETECTED Final   Parainfluenza Virus 4 NOT DETECTED NOT DETECTED Final   Respiratory Syncytial Virus NOT DETECTED NOT DETECTED Final   Bordetella pertussis NOT DETECTED NOT DETECTED Final   Bordetella Parapertussis NOT DETECTED NOT DETECTED Final   Chlamydophila pneumoniae NOT DETECTED NOT DETECTED Final   Mycoplasma pneumoniae NOT DETECTED NOT DETECTED Final    Comment: Performed at [facility], [place]. [address] [place], [place] [address]   CSF culture     Status: None   Collection Time: 11/27/20  9:38 AM   Specimen: PATH Cytology CSF; Cerebrospinal Fluid  Result Value Ref Range Status   Specimen Description CSF  Final   Special Requests NONE  Final   Gram Stain   Final    WBC PRESENT, PREDOMINANTLY MONONUCLEAR NO ORGANISMS SEEN CYTOSPIN SMEAR    Culture   Final    NO GROWTH 3 DAYS Performed at [facility], [place] [address], [place], [place] [address]    Report Status 11/30/2020 FINAL  Final  Fungus Culture With Stain     Status: None (Preliminary result)   Collection Time: 11/27/20  9:38 AM   Specimen: PATH Cytology CSF; Cerebrospinal Fluid  Result Value Ref Range Status   Fungus Stain Final report  Final    Comment: (NOTE) Performed At: [facility] [place], [place] [id number] Rush Farmer MD [id number]    Fungus (Mycology) Culture PENDING  Incomplete   Fungal Source CSF  Final    Comment: Performed at [facility], [place] [address] [place], [place] [address]  Fungus Culture Result     Status: None   Collection Time: 11/27/20  9:38 AM  Result Value Ref Range Status   Result 1 Comment  Final    Comment: (NOTE) KOH/Calcofluor preparation:  no fungus observed. Performed At: [facility] [place], [place] [id number] Rush Farmer MD [id number]      Labs: BNP (last 3 results) No results for input(s): BNP in the last 8760 hours. Basic Metabolic Panel: Recent Labs  Lab 11/25/20 2234 11/27/20 0731 11/28/20 0127 11/30/20 0204 12/01/20 0320  NA 138 139 138 141 139  K 4.3 3.8 3.7 3.4* 3.9  CL 106 105 106 106 104  CO2 21* 22 21* 24 22  GLUCOSE 101* 114* 90 84 69*  BUN 17 18 13 10 11   CREATININE 1.00 1.01 0.96 1.26* 1.31*  CALCIUM 8.7* 8.6* 8.5* 8.2* 8.6*   Liver Function Tests: Recent Labs  Lab 11/26/20 0700 11/27/20 0731 11/28/20 0127  AST 79* 73* 61*  ALT 57* 57* 53*  ALKPHOS 71 70 65  BILITOT 0.9 0.7 0.9  PROT 6.7 6.5 6.7   ALBUMIN 3.4* 3.2* 3.3*   No results for input(s): LIPASE, AMYLASE in the last 168 hours. Recent Labs  Lab 11/26/20 0700  AMMONIA 16   CBC: Recent Labs  Lab 11/25/20 2234 11/27/20 0731 11/30/20 0204  WBC 5.5 4.8 7.6  NEUTROABS  --   --  1.5*  HGB 15.5 14.2 13.4  HCT 45.8 41.0 38.3*  MCV 85.4 84.5 83.1  PLT 162 184 222   Cardiac Enzymes: Recent Labs  Lab 11/26/20 0700  CKTOTAL 108   BNP: Invalid input(s): POCBNP CBG: Recent Labs  Lab 11/26/20 0754  GLUCAP 87   D-Dimer No results for input(s): DDIMER in the last 72 hours. Hgb A1c No results for input(s): HGBA1C in the last 72 hours. Lipid Profile No results for input(s): CHOL, HDL, LDLCALC, TRIG, CHOLHDL, LDLDIRECT in the last 72 hours. Thyroid function studies No results for input(s): TSH, T4TOTAL, T3FREE, THYROIDAB in the last 72 hours.  Invalid input(s): FREET3 Anemia work up No results for input(s): VITAMINB12, FOLATE, FERRITIN, TIBC, IRON, RETICCTPCT in the last 72 hours. Urinalysis    Component Value Date/Time   COLORURINE YELLOW 12/23/2019 0937   APPEARANCEUR CLEAR 12/23/2019 0937   LABSPEC 1.015 07/07/2020 1322   PHURINE 5.5 07/07/2020 1322   GLUCOSEU NEGATIVE 07/07/2020 1322   GLUCOSEU NEG mg/dL 12/09/2009 1819   HGBUR NEGATIVE 07/07/2020 1322   BILIRUBINUR NEGATIVE 07/07/2020 1322   KETONESUR NEGATIVE 07/07/2020 1322   PROTEINUR NEGATIVE 07/07/2020 1322   UROBILINOGEN 0.2 07/07/2020 1322   NITRITE NEGATIVE 07/07/2020 1322   LEUKOCYTESUR NEGATIVE 07/07/2020 1322   Sepsis Labs Invalid input(s): PROCALCITONIN,  WBC,  LACTICIDVEN Microbiology Recent Results (from the past 240 hour(s))  SARS Coronavirus 2 by RT PCR (hospital order, performed in [place] hospital lab) Nasopharyngeal Nasopharyngeal Swab     Status: None   Collection Time: 11/25/20 11:26 PM   Specimen: Nasopharyngeal Swab  Result Value Ref Range Status   SARS Coronavirus 2 NEGATIVE NEGATIVE Final    Comment:  (NOTE) SARS-CoV-2 target nucleic acids are NOT DETECTED.  The SARS-CoV-2 RNA is generally detectable in upper and lower respiratory specimens during the acute phase of infection. The lowest concentration of SARS-CoV-2 viral copies this assay can detect is 250 copies / mL. A negative result does not  preclude SARS-CoV-2 infection and should not be used as the sole basis for treatment or other patient management decisions.  A negative result may occur with improper specimen collection / handling, submission of specimen other than nasopharyngeal swab, presence of viral mutation(s) within the areas targeted by this assay, and inadequate number of viral copies (<250 copies / mL). A negative result must be combined with clinical observations, patient history, and epidemiological information.  Fact Sheet for Patients:   StrictlyIdeas.no  Fact Sheet for Healthcare Providers: BankingDealers.co.za  This test is not yet approved or  cleared by the [place] FDA and has been authorized for detection and/or diagnosis of SARS-CoV-2 by FDA under an Emergency Use Authorization (EUA).  This EUA will remain in effect (meaning this test can be used) for the duration of the COVID-19 declaration under Section 564(b)(1) of the Act, 21 U.S.C. section 360bbb-3(b)(1), unless the authorization is terminated or revoked sooner.  Performed at [facility], [place] [address], [place], [place] [address]   Respiratory (~20 pathogens) panel by PCR     Status: None   Collection Time: 11/26/20  2:17 PM   Specimen: Nasopharyngeal Swab; Respiratory  Result Value Ref Range Status   Adenovirus NOT DETECTED NOT DETECTED Final   Coronavirus 229E NOT DETECTED NOT DETECTED Final    Comment: (NOTE) The Coronavirus on the Respiratory Panel, DOES NOT test for the novel  Coronavirus (2019 nCoV)    Coronavirus HKU1 NOT DETECTED NOT DETECTED Final   Coronavirus NL63 NOT  DETECTED NOT DETECTED Final   Coronavirus OC43 NOT DETECTED NOT DETECTED Final   Metapneumovirus NOT DETECTED NOT DETECTED Final   Rhinovirus / Enterovirus NOT DETECTED NOT DETECTED Final   Influenza A NOT DETECTED NOT DETECTED Final   Influenza B NOT DETECTED NOT DETECTED Final   Parainfluenza Virus 1 NOT DETECTED NOT DETECTED Final   Parainfluenza Virus 2 NOT DETECTED NOT DETECTED Final   Parainfluenza Virus 3 NOT DETECTED NOT DETECTED Final   Parainfluenza Virus 4 NOT DETECTED NOT DETECTED Final   Respiratory Syncytial Virus NOT DETECTED NOT DETECTED Final   Bordetella pertussis NOT DETECTED NOT DETECTED Final   Bordetella Parapertussis NOT DETECTED NOT DETECTED Final   Chlamydophila pneumoniae NOT DETECTED NOT DETECTED Final   Mycoplasma pneumoniae NOT DETECTED NOT DETECTED Final    Comment: Performed at [facility], [place]. [address], [place], [place] [address]  CSF culture     Status: None   Collection Time: 11/27/20  9:38 AM   Specimen: PATH Cytology CSF; Cerebrospinal Fluid  Result Value Ref Range Status   Specimen Description CSF  Final   Special Requests NONE  Final   Gram Stain   Final    WBC PRESENT, PREDOMINANTLY MONONUCLEAR NO ORGANISMS SEEN CYTOSPIN SMEAR    Culture   Final    NO GROWTH 3 DAYS Performed at [facility], [place] [address], [place],  [address]    Report Status 11/30/2020 FINAL  Final  Fungus Culture With Stain     Status: None (Preliminary result)   Collection Time: 11/27/20  9:38 AM   Specimen: PATH Cytology CSF; Cerebrospinal Fluid  Result Value Ref Range Status   Fungus Stain Final report  Final    Comment: (NOTE) Performed At: [facility] [place], [place] [id number] Rush Farmer MD [id number]    Fungus (Mycology) Culture PENDING  Incomplete   Fungal Source CSF  Final    Comment: Performed at [facility], [place] [address], [place], [place] [address]  Fungus Culture Result     Status: None    Collection Time: 11/27/20  9:38 AM  Result Value Ref Range Status   Result 1 Comment  Final    Comment: (NOTE) KOH/Calcofluor preparation:  no fungus observed. Performed At: [facility] [place], [place] [id number] Rush Farmer MD [id number]     Please note: You were cared for by a hospitalist during your hospital stay. Once you are discharged, your primary care physician will handle any further medical issues. Please note that NO REFILLS for any discharge medications will be authorized once you are discharged, as it is imperative that you return to your primary care physician (or establish a relationship with a primary care physician if you do not have one) for your post hospital discharge needs so that they can reassess your need for medications and monitor your lab values.    Time coordinating discharge: 40 minutes  SIGNED:   Shelly Coss, MD  [facility] 12/01/2020, 10:56 AM Pager [phone]  If 7PM-7AM, please contact night-coverage [email or web address] Password [email or web address]

## 2020-12-02 ENCOUNTER — Telehealth: Payer: Self-pay

## 2020-12-02 LAB — HSV(HERPES SMPLX VRS)ABS-I+II(IGG)-CSF: HSV Type I/II Ab, IgG CSF: 0.77 IV (ref <=0.89)

## 2020-12-02 LAB — MISC LABCORP TEST (SEND OUT): Labcorp test code: 9985

## 2020-12-02 LAB — METHYLMALONIC ACID, SERUM: Methylmalonic Acid, Quantitative: 104 nmol/L (ref 0–378)

## 2020-12-02 NOTE — Telephone Encounter (Signed)
Transition Care Management Follow-up Telephone Call  Date of discharge and from where: 12/02/20 from [facility]  How have you been since you were released from the hospital? Spoke to pt dtr Shirlee Limerick). She stated that patient is in a lot of pain and having headaches.   Any questions or concerns? No  Items Reviewed:  Did the pt receive and understand the discharge instructions provided? Yes   Medications obtained and verified? Yes   Other? No   Any new allergies since your discharge? No   Dietary orders reviewed? N/A  Do you have support at home? Yes   Home Care and Equipment/Supplies: Were home health services ordered? Yes If so, what is the name of the agency? [facility] Has the agency set up a time to come to the patient's home? No Were any new equipment or medical supplies ordered?  Yes: [facility] What is the name of the medical supply agency? [facility]charge Were you able to get the supplies/equipment? Yes Do you have any questions related to the use of the equipment or supplies? No  Functional Questionnaire: (I = Independent and D = Dependent) ADLs: I  Bathing/Dressing- D  Meal Prep- D  Eating- I  Maintaining continence- I  Transferring/Ambulation- D - not able  Managing Meds- I   Follow up appointments reviewed:   PCP Hospital f/u appt confirmed? Yes  Scheduled to see Juluis Mire, NP on 12/11/2020 @ 09:30am.  [facility] f/u appt confirmed? Yes  Scheduled to see Bobby Rumpf, MD on 12/08/2020 @ 10:45am.  Are transportation arrangements needed? No   If their condition worsens, is the pt aware to call PCP or go to the Emergency Dept.? Yes  Was the patient provided with contact information for the PCP's office or ED? Yes  Was to pt encouraged to call back with questions or concerns? Yes

## 2020-12-03 LAB — VITAMIN E
Vitamin E (Alpha Tocopherol): 8.1 mg/L — ABNORMAL LOW (ref 9.0–29.0)
Vitamin E(Gamma Tocopherol): 0.5 mg/L (ref 0.5–4.9)

## 2020-12-08 ENCOUNTER — Ambulatory Visit (INDEPENDENT_AMBULATORY_CARE_PROVIDER_SITE_OTHER): Payer: Self-pay | Admitting: Infectious Diseases

## 2020-12-08 ENCOUNTER — Encounter: Payer: Self-pay | Admitting: Infectious Diseases

## 2020-12-08 ENCOUNTER — Other Ambulatory Visit: Payer: Self-pay

## 2020-12-08 DIAGNOSIS — G9341 Metabolic encephalopathy: Secondary | ICD-10-CM

## 2020-12-08 DIAGNOSIS — F17200 Nicotine dependence, unspecified, uncomplicated: Secondary | ICD-10-CM

## 2020-12-08 DIAGNOSIS — R630 Anorexia: Secondary | ICD-10-CM

## 2020-12-08 DIAGNOSIS — N1831 Chronic kidney disease, stage 3a: Secondary | ICD-10-CM

## 2020-12-08 DIAGNOSIS — R634 Abnormal weight loss: Secondary | ICD-10-CM

## 2020-12-08 DIAGNOSIS — B20 Human immunodeficiency virus [HIV] disease: Secondary | ICD-10-CM

## 2020-12-08 MED ORDER — FAMOTIDINE 40 MG PO TABS: 40.0000 mg | ORAL_TABLET | Freq: Every day | ORAL | 0 refills | Status: DC

## 2020-12-08 MED ORDER — MEGESTROL ACETATE 400 MG/10ML PO SUSP: 400.0000 mg | Freq: Every day | ORAL | 3 refills | Status: DC

## 2020-12-08 MED ORDER — TIVICAY 50 MG PO TABS: 50.0000 mg | ORAL_TABLET | Freq: Every day | ORAL | 5 refills | Status: DC

## 2020-12-08 MED ORDER — SYMTUZA 800-150-200-10 MG PO TABS: 1.0000 | ORAL_TABLET | Freq: Every day | ORAL | 5 refills | Status: DC

## 2020-12-08 NOTE — Assessment & Plan Note (Signed)
Will refill megace 

## 2020-12-08 NOTE — Assessment & Plan Note (Addendum)
Today states that his medications have all run out.  Will refill Will refill his megace as well , very concerned about his wt loss.  States he has had COVID vax I have some concern that he is dehydrated- his VS are not markedly abn but he is dizzy with sitting up from the exam table.  Will see him back in 2-3 weeks.  Seen with interpreter as well as his daughter. Plan discussed with them.

## 2020-12-08 NOTE — Assessment & Plan Note (Signed)
Appears to be resolved.  Will continue to watch.

## 2020-12-08 NOTE — Assessment & Plan Note (Signed)
None since out of hospital

## 2020-12-08 NOTE — Progress Notes (Signed)
   Subjective:    Patient ID: Eric Jefferson, male  DOB: [birth year], 60 y.o.        MRN: [id number]   HPI 60 yo [place] M with hx of AIDS, prev TB, adm to hospital 1-26 to 12-01-20 for fever and headaches at home, mental status change.  He underwent extensive w/u showing maxillary and sphenoid sinusitis on CT. His MRI head was (-). His LP showed 14 WBC (L), HSV PCR (-), VDRL (-), Crypto (-). His VL was 414k and his CD4 was 131. COVID (-).  He improved with supportive care (brief course of Acyclovir).  He comes to clinic today with complaints of chest pain- an ECG done in clinic shows NSR 66 bpm. He c/o pain and dizziness this AM. Has taken no OTCs. In between meal. No pain with eating.  Has been getting enough to eat, but his appetite as not returned to normal yet.  Has lost 13#.  He states he has been taking his ART regularly.   HIV 1 RNA Quant (copies/mL)  Date Value  11/28/2020 414,000  04/09/2020 <20 NOT DETECTED  12/23/2019 <20 DETECTED (A)   CD4 T Cell Abs (/uL)  Date Value  11/26/2020 131 (L)  04/09/2020 863  12/23/2019 485     Health Maintenance  Topic Date Due  . COVID-19 Vaccine (1) Never done  . TETANUS/TDAP  Never done  . COLONOSCOPY (Pts 45-74yrs Insurance coverage will need to be confirmed)  03/05/2027  . INFLUENZA VACCINE  Completed  . Hepatitis C Screening  Completed  . HIV Screening  Completed      Review of Systems  Constitutional: Negative for chills and fever.  Respiratory: Negative for cough and shortness of breath.   Cardiovascular: Positive for chest pain.  Gastrointestinal: Negative for constipation and diarrhea.  Genitourinary: Negative for dysuria.  Neurological: Positive for dizziness and headaches.    Please see HPI. All other systems reviewed and negative.     Objective:  Physical Exam Vitals reviewed.  Constitutional:      General: He is not in acute distress.    Appearance: Normal appearance. He is not toxic-appearing.  HENT:      Mouth/Throat:     Mouth: Mucous membranes are moist.     Pharynx: No oropharyngeal exudate.  Eyes:     Extraocular Movements: Extraocular movements intact.     Pupils: Pupils are equal, round, and reactive to light.  Cardiovascular:     Rate and Rhythm: Normal rate and regular rhythm.  Pulmonary:     Effort: Pulmonary effort is normal.     Breath sounds: Normal breath sounds.  Abdominal:     General: Bowel sounds are normal. There is no distension.     Palpations: Abdomen is soft.     Tenderness: There is no abdominal tenderness.  Musculoskeletal:        General: Normal range of motion.     Cervical back: Normal range of motion and neck supple.     Right lower leg: No edema.     Left lower leg: No edema.  Neurological:     General: No focal deficit present.     Mental Status: He is alert.            Assessment & Plan:

## 2020-12-08 NOTE — Assessment & Plan Note (Addendum)
Refill meagce Will also refill his pepcid to see if this helps his CP (non-cardiac). He has no tenderness on palpation/flexion ofhis chest.

## 2020-12-08 NOTE — Assessment & Plan Note (Signed)
Slight increase in hospital but appears within baseline

## 2020-12-09 LAB — COMPREHENSIVE METABOLIC PANEL
AG Ratio: 1.1 (calc) (ref 1.0–2.5)
ALT: 43 U/L (ref 9–46)
AST: 37 U/L — ABNORMAL HIGH (ref 10–35)
Albumin: 4.1 g/dL (ref 3.6–5.1)
Alkaline phosphatase (APISO): 72 U/L (ref 35–144)
BUN: 24 mg/dL (ref 7–25)
CO2: 27 mmol/L (ref 20–32)
Calcium: 9.4 mg/dL (ref 8.6–10.3)
Chloride: 104 mmol/L (ref 98–110)
Creat: 0.94 mg/dL (ref 0.70–1.25)
Globulin: 3.7 g/dL (calc) (ref 1.9–3.7)
Glucose, Bld: 91 mg/dL (ref 65–99)
Potassium: 5 mmol/L (ref 3.5–5.3)
Sodium: 140 mmol/L (ref 135–146)
Total Bilirubin: 0.5 mg/dL (ref 0.2–1.2)
Total Protein: 7.8 g/dL (ref 6.1–8.1)

## 2020-12-09 LAB — CBC WITH DIFFERENTIAL/PLATELET
Absolute Monocytes: 1560 cells/uL — ABNORMAL HIGH (ref 200–950)
Basophils Absolute: 47 cells/uL (ref 0–200)
Basophils Relative: 0.5 %
Eosinophils Absolute: 169 cells/uL (ref 15–500)
Eosinophils Relative: 1.8 %
HCT: 42.9 % (ref 38.5–50.0)
Hemoglobin: 15.1 g/dL (ref 13.2–17.1)
Lymphs Abs: 2745 cells/uL (ref 850–3900)
MCH: 30.8 pg (ref 27.0–33.0)
MCHC: 35.2 g/dL (ref 32.0–36.0)
MCV: 87.4 fL (ref 80.0–100.0)
MPV: 11.5 fL (ref 7.5–12.5)
Monocytes Relative: 16.6 %
Neutro Abs: 4879 cells/uL (ref 1500–7800)
Neutrophils Relative %: 51.9 %
Platelets: 189 10*3/uL (ref 140–400)
RBC: 4.91 10*6/uL (ref 4.20–5.80)
RDW: 13.1 % (ref 11.0–15.0)
Total Lymphocyte: 29.2 %
WBC: 9.4 10*3/uL (ref 3.8–10.8)

## 2020-12-09 LAB — CORTISOL: Cortisol, Plasma: 17 ug/dL

## 2020-12-10 LAB — VITAMIN B1: Vitamin B1 (Thiamine): 83 nmol/L (ref 66.5–200.0)

## 2020-12-11 ENCOUNTER — Inpatient Hospital Stay (INDEPENDENT_AMBULATORY_CARE_PROVIDER_SITE_OTHER): Payer: Medicaid Other | Admitting: Primary Care

## 2020-12-24 ENCOUNTER — Telehealth: Payer: Self-pay

## 2020-12-24 ENCOUNTER — Ambulatory Visit: Payer: Medicaid Other | Admitting: Infectious Diseases

## 2020-12-24 NOTE — Telephone Encounter (Signed)
Attempted to convert patient to phone visit. Per [place] interpreters no [place] interpreters available at this time. Patient accepts next day appointment with Dr. [name].  Eugenia Mcalpine

## 2020-12-25 ENCOUNTER — Other Ambulatory Visit: Payer: Self-pay

## 2020-12-25 ENCOUNTER — Ambulatory Visit (INDEPENDENT_AMBULATORY_CARE_PROVIDER_SITE_OTHER): Payer: Self-pay | Admitting: Infectious Diseases

## 2020-12-25 ENCOUNTER — Encounter: Payer: Self-pay | Admitting: Infectious Diseases

## 2020-12-25 DIAGNOSIS — F17200 Nicotine dependence, unspecified, uncomplicated: Secondary | ICD-10-CM

## 2020-12-25 DIAGNOSIS — R519 Headache, unspecified: Secondary | ICD-10-CM | POA: Insufficient documentation

## 2020-12-25 DIAGNOSIS — B20 Human immunodeficiency virus [HIV] disease: Secondary | ICD-10-CM

## 2020-12-25 DIAGNOSIS — G4452 New daily persistent headache (NDPH): Secondary | ICD-10-CM

## 2020-12-25 DIAGNOSIS — R634 Abnormal weight loss: Secondary | ICD-10-CM

## 2020-12-25 NOTE — Progress Notes (Signed)
Subjective:    Patient ID: Eric Jefferson, male  DOB: September 13, 1961, 60 y.o.        MRN: [id number]   HPI 60 yo [place] M with hx of AIDS, prev TB, adm to hospital 1-26 to 12-01-20 for fever and headaches at home, mental status change.  He underwent extensive w/u showing maxillary and sphenoid sinusitis on CT. His MRI head was (-). His LP showed 14 WBC (L), HSV PCR (-), VDRL (-), Crypto (-). His VL was 414k and his CD4 was 131. COVID (-).  He improved with supportive care (brief course of Acyclovir).  He was seen in ID clinic 2 weeks ago as f/u. He was restarted on megace due to concerns about wt loss (13#).  Today he complains of headache for last 2-3 days. Worse on top of head, starting on R shoulder. Pain feels heavy. Has upon rising until ~ lunch. Occasionally returns at lunch.  Sometimes has blurry vision.  Gets relief from tylenol.      He states he has been taking his ART regularly.   HIV 1 RNA Quant (copies/mL)  Date Value  11/28/2020 414,000  04/09/2020 <20 NOT DETECTED  12/23/2019 <20 DETECTED (A)   CD4 T Cell Abs (/uL)  Date Value  11/26/2020 131 (L)  04/09/2020 863  12/23/2019 485     Health Maintenance  Topic Date Due  . COVID-19 Vaccine (1) Never done  . TETANUS/TDAP  Never done  . COLONOSCOPY (Pts 45-82yrs Insurance coverage will need to be confirmed)  03/05/2027  . INFLUENZA VACCINE  Completed  . Hepatitis C Screening  Completed  . HIV Screening  Completed      Review of Systems  Constitutional: Negative for chills and fever.  Eyes: Positive for blurred vision.  Respiratory: Negative for cough.   Gastrointestinal: Negative for constipation and diarrhea.  Genitourinary: Negative for dysuria.  Neurological: Positive for dizziness and headaches. Negative for focal weakness.    Please see HPI. All other systems reviewed and negative.     Objective:  Physical Exam Vitals reviewed.  Constitutional:      General: He is not in acute distress.     Appearance: Normal appearance. He is not toxic-appearing or diaphoretic.  HENT:     Mouth/Throat:     Mouth: Mucous membranes are moist.     Pharynx: No oropharyngeal exudate.  Eyes:     Extraocular Movements: Extraocular movements intact.     Pupils: Pupils are equal, round, and reactive to light.  Cardiovascular:     Rate and Rhythm: Normal rate and regular rhythm.  Pulmonary:     Effort: Pulmonary effort is normal.     Breath sounds: Normal breath sounds.  Abdominal:     General: Abdomen is flat. Bowel sounds are normal. There is no distension.     Palpations: Abdomen is soft.     Tenderness: There is no abdominal tenderness.  Musculoskeletal:        General: Normal range of motion.     Cervical back: Normal range of motion and neck supple. No rigidity.     Right lower leg: No edema.     Left lower leg: No edema.  Neurological:     General: No focal deficit present.     Mental Status: He is alert.     Cranial Nerves: No cranial nerve deficit.     Motor: No weakness.  Psychiatric:        Mood and Affect: Mood normal.  Assessment & Plan:

## 2020-12-25 NOTE — Assessment & Plan Note (Addendum)
Appears to be doing better Will continue his current slavage rx Not taking megace, will remove from list.  rtc in 2-3 weeks.

## 2020-12-25 NOTE — Assessment & Plan Note (Signed)
Will stop megace to see if this is causing headache.

## 2020-12-25 NOTE — Assessment & Plan Note (Signed)
Will get him on wellbutrin (or nicotine patch) when headaches resolved.

## 2020-12-25 NOTE — Assessment & Plan Note (Addendum)
Will repeat his CT head (done last month as well MRI).  His BP is normal. Not clear if he is getting orthostatic, he is not on BP rx. Would not expect his HA to last all day from orthostasis.  He is not taking megace.

## 2020-12-26 LAB — FUNGUS CULTURE WITH STAIN

## 2020-12-26 LAB — FUNGAL ORGANISM REFLEX

## 2020-12-26 LAB — FUNGUS CULTURE RESULT

## 2021-01-01 ENCOUNTER — Telehealth: Payer: Self-pay

## 2021-01-01 NOTE — Telephone Encounter (Signed)
-----   Message from [name], MD sent at 12/31/2020  2:56 PM EST ----- Can you call interpreter and get him into this CT head? Thanks  ----- Message ----- From: SYSTEM Sent: 12/30/2020  12:14 AM EST To: [name], MD

## 2021-01-01 NOTE — Telephone Encounter (Signed)
Patient was scheduled for CT during last office visit with [name] and interpreter. Patient is aware of appointment.

## 2021-01-04 ENCOUNTER — Other Ambulatory Visit: Payer: Self-pay

## 2021-01-04 ENCOUNTER — Ambulatory Visit (HOSPITAL_COMMUNITY)
Admission: RE | Admit: 2021-01-04 | Discharge: 2021-01-04 | Disposition: A | Discharge status: Home / Self Care | Payer: Self-pay | Source: Ambulatory Visit | Attending: Infectious Diseases | Admitting: Infectious Diseases

## 2021-01-04 DIAGNOSIS — G4452 New daily persistent headache (NDPH): Secondary | ICD-10-CM | POA: Insufficient documentation

## 2021-01-04 MED ORDER — IOHEXOL 300 MG/ML  SOLN
75.0000 mL | Freq: Once | INTRAMUSCULAR | Status: AC | PRN
  Administered 2021-01-04: 75 mL via INTRAVENOUS

## 2021-01-11 ENCOUNTER — Other Ambulatory Visit: Payer: Self-pay | Admitting: Infectious Diseases

## 2021-01-11 DIAGNOSIS — R630 Anorexia: Secondary | ICD-10-CM

## 2021-01-11 DIAGNOSIS — R634 Abnormal weight loss: Secondary | ICD-10-CM

## 2021-01-12 ENCOUNTER — Other Ambulatory Visit: Payer: Self-pay

## 2021-01-12 ENCOUNTER — Ambulatory Visit (INDEPENDENT_AMBULATORY_CARE_PROVIDER_SITE_OTHER): Payer: Self-pay | Admitting: Infectious Diseases

## 2021-01-12 ENCOUNTER — Encounter: Payer: Self-pay | Admitting: Infectious Diseases

## 2021-01-12 ENCOUNTER — Ambulatory Visit
Admission: RE | Admit: 2021-01-12 | Discharge: 2021-01-12 | Disposition: A | Discharge status: Home / Self Care | Payer: Medicaid Other | Source: Ambulatory Visit | Attending: Infectious Diseases | Admitting: Infectious Diseases

## 2021-01-12 ENCOUNTER — Other Ambulatory Visit: Payer: Self-pay | Admitting: Infectious Diseases

## 2021-01-12 DIAGNOSIS — G4452 New daily persistent headache (NDPH): Secondary | ICD-10-CM

## 2021-01-12 DIAGNOSIS — W19XXXS Unspecified fall, sequela: Secondary | ICD-10-CM

## 2021-01-12 DIAGNOSIS — Y92009 Unspecified place in unspecified non-institutional (private) residence as the place of occurrence of the external cause: Secondary | ICD-10-CM

## 2021-01-12 DIAGNOSIS — F17200 Nicotine dependence, unspecified, uncomplicated: Secondary | ICD-10-CM

## 2021-01-12 DIAGNOSIS — B20 Human immunodeficiency virus [HIV] disease: Secondary | ICD-10-CM

## 2021-01-12 DIAGNOSIS — W19XXXA Unspecified fall, initial encounter: Secondary | ICD-10-CM

## 2021-01-12 MED ORDER — MEGESTROL ACETATE 400 MG/10ML PO SUSP: 400.0000 mg | Freq: Every day | ORAL | 3 refills | Status: DC

## 2021-01-12 NOTE — Addendum Note (Signed)
Addended by: Victor Granados C on: 01/12/2021 04:21 PM   Modules accepted: Orders

## 2021-01-12 NOTE — Assessment & Plan Note (Addendum)
Will check plain films of r shoulder Minimal pain, no deformity on exam.

## 2021-01-12 NOTE — Assessment & Plan Note (Signed)
Will give him patches.  Smoking 1/4 ppd.

## 2021-01-12 NOTE — Assessment & Plan Note (Signed)
Previously taking meds without issue.  His labs were well controlled.

## 2021-01-12 NOTE — Assessment & Plan Note (Signed)
Will have him seen by neuro.  Does not appear cardiac or cervicobasil?

## 2021-01-12 NOTE — Progress Notes (Signed)
   Subjective:    Patient ID: Eric Jefferson, male  DOB: Jul 23, 1961, 60 y.o.        MRN: [id number]   HPI 60 yo [place] M with hx of AIDS, prev TB, adm to hospital 1-26 to 12-01-20 for fever and headaches at home, mental status change.  He underwent extensive w/u showing maxillary and sphenoid sinusitis on CT. His MRI head was (-). His LP showed 14 WBC (L), HSV PCR (-), VDRL (-), Crypto (-). His VL was 414k and his CD4 was 131. COVID (-).  He improved with supportive care (brief course of Acyclovir).  He was seen in ID clinic 2 weeks ago with concerns about dizziness and headaches.  He has another fall in the intervening period. No LOC. He relates the fall to dizziness.  In the intervening period he had CT head ( no acute change).  Has had decreased appetite. Has not gotten megace.   HIV 1 RNA Quant (copies/mL)  Date Value  11/28/2020 414,000  04/09/2020 <20 NOT DETECTED  12/23/2019 <20 DETECTED (A)   CD4 T Cell Abs (/uL)  Date Value  11/26/2020 131 (L)  04/09/2020 863  12/23/2019 485     Health Maintenance  Topic Date Due  . COVID-19 Vaccine (1) Never done  . TETANUS/TDAP  Never done  . COLONOSCOPY (Pts 45-8yrs Insurance coverage will need to be confirmed)  03/05/2027  . INFLUENZA VACCINE  Completed  . Hepatitis C Screening  Completed  . HIV Screening  Completed  . HPV VACCINES  Aged Out      Review of Systems  Constitutional: Negative for chills, fever and weight loss.  Respiratory: Negative for cough and shortness of breath.   Cardiovascular: Negative for chest pain (has had on R side).  Gastrointestinal: Negative for constipation and diarrhea.  Genitourinary: Negative for dysuria.  Neurological: Positive for dizziness and headaches.    Please see HPI. All other systems reviewed and negative.     Objective:  Physical Exam Vitals reviewed.  Constitutional:      General: He is not in acute distress.    Appearance: Normal appearance. He is not ill-appearing or  diaphoretic.  HENT:     Mouth/Throat:     Mouth: Mucous membranes are moist.     Pharynx: No oropharyngeal exudate.  Eyes:     Extraocular Movements: Extraocular movements intact.     Pupils: Pupils are equal, round, and reactive to light.  Cardiovascular:     Rate and Rhythm: Normal rate and regular rhythm.  Pulmonary:     Effort: Pulmonary effort is normal.     Breath sounds: Normal breath sounds.  Abdominal:     General: Abdomen is flat. There is no distension.     Palpations: Abdomen is soft.     Tenderness: There is no abdominal tenderness.  Musculoskeletal:     Cervical back: Normal range of motion. No rigidity (has muscle soreness with rotation. ) or tenderness.     Right lower leg: No edema.     Left lower leg: No edema.  Lymphadenopathy:     Cervical: No cervical adenopathy.  Neurological:     General: No focal deficit present.     Mental Status: He is alert.  Psychiatric:        Mood and Affect: Mood normal.            Assessment & Plan:

## 2021-01-19 ENCOUNTER — Encounter: Payer: Self-pay | Admitting: Neurology

## 2021-01-28 ENCOUNTER — Ambulatory Visit: Payer: Medicaid Other | Admitting: Infectious Diseases

## 2021-03-23 ENCOUNTER — Ambulatory Visit: Payer: Medicaid Other | Admitting: Neurology

## 2021-03-30 NOTE — Progress Notes (Signed)
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  1.  Start gabapentin 100mg  titrating to 300mg  at bedtime.  We can increase dose thereafter if needed. 2.  Limit use of pain relievers to no more than 2 days out of week to prevent risk of rebound or medication-overuse headache. 3.  Follow up 6 months.   Subjective:  Eric Jefferson is a 60 year old right-handed [place] male with HIV, diabetes and history of TB who presents for headache.  History supplemented by hospital records and referring provider's notes.  He is accompanied by a [place] speaking interpreter.  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  Presented with headache, weakness and questionable altered mental status.  Reported fever at home.  CT brain showed maxillary and sphenoid sinusitis.  MRI of brain with and without contrast were negative for acute intracranial abnormality.  He was started on acyclovir empirically.  Underwent LP - CSF cell count was 14 (lymphocyte predominance) with mildly elevated protein of 62, glucose 63, negative gram stain/culture. CSF Cryptococcal antigen, fungal culture, HSV PCR and VDRL were negative.  UDS was negative.  TSH, B1, B12 and TSH were normal.  EEG was normal.  MRI of cervical and thoracic spine with and without contrast showed some degenerative changes with foraminal impingement on right at C4-5 and left at C5-6 but no cord abnormalities.    Since hospital discharge, he reports daily headaches.  It is a dull pain starting in the right paraspinal region radiating up the head and into the right eye with associated blurred vision and lacrimation in the right eye.  No nausea, vomiting, photophobia, phonophobia, ptosis,  conjunctival injection, numbness or weakness.  They occur twice a day, usually in the morning and then in the afternoon.  Treats with Tylenol or Advil, but not daily.  No prior history of headache.   PAST MEDICAL HISTORY: Past Medical History:  Diagnosis Date  . Arthritis   . Diabetes ([place])    02-26-20 pt denies  . Headache   . HIV (human immunodeficiency virus infection) ([place])   . TB (pulmonary tuberculosis)    dx in 2009- took years worth of medication for this    PAST SURGICAL HISTORY: No past surgical history on file.  MEDICATIONS: Current Outpatient Medications on File Prior to Visit  Medication Sig Dispense Refill  . Darunavir-Cobicisctat-Emtricitabine-Tenofovir Alafenamide (SYMTUZA) 800-150-200-10 MG TABS Take 1 tablet by mouth daily with breakfast. Take with Tivicay. 30 tablet 5  . dolutegravir (TIVICAY) 50 MG tablet Take 1 tablet (50 mg total) by mouth daily. Take with Symtuza. 30 tablet 5  . famotidine (PEPCID) 40 MG tablet TAKE 1 TABLET(40 MG) BY MOUTH DAILY (Patient not taking: Reported on 01/12/2021) 30 tablet 0  . megestrol (MEGACE) 400 MG/10ML suspension Take 10 mLs (400 mg total) by mouth daily. 240 mL 3  . [DISCONTINUED] atorvastatin (LIPITOR) 10 MG tablet TAKE 1 TABLET(10 MG) BY MOUTH DAILY (Patient not taking: Reported on 03/04/2020) 30 tablet 0  . [DISCONTINUED] traZODone (DESYREL) 50 MG tablet Take 0.5 tablets (25 mg total) by mouth at bedtime as needed for sleep. (Patient not taking: Reported on 03/04/2020) 20 tablet 1   No current facility-administered medications on file prior to visit.    ALLERGIES: Allergies  Allergen Reactions  .  Soy Allergy     Upset stomach only- no other issues    FAMILY HISTORY: Family History  Problem Relation Age of Onset  . Healthy Mother   . Healthy Father   . Colon cancer Neg Hx   . Esophageal cancer Neg Hx   . Rectal cancer Neg Hx   . Stomach cancer Neg Hx     Objective:  Blood pressure 112/82, pulse 92, height 5\' 2"   (1.575 m), weight 117 lb (53.1 kg), SpO2 98 %. General: No acute distress.  Patient appears well-groomed.   Head:  Normocephalic/atraumatic Eyes:  fundi examined but not visualized Neck: supple, no paraspinal tenderness, full range of motion Back: No paraspinal tenderness Heart: regular rate and rhythm Lungs: Clear to auscultation bilaterally. Vascular: No carotid bruits. Neurological Exam: Mental status: alert and oriented to person, place, and time, recent and remote memory intact, fund of knowledge intact, attention and concentration intact, speech fluent and not dysarthric, language intact. Cranial nerves: CN I: not tested CN II: pupils equal, round and reactive to light, visual fields intact CN III, IV, VI:  full range of motion, no nystagmus, no ptosis CN V: facial sensation intact. CN VII: upper and lower face symmetric CN VIII: hearing intact CN IX, X: gag intact, uvula midline CN XI: sternocleidomastoid and trapezius muscles intact CN XII: tongue midline Bulk & Tone: normal, no fasciculations. Motor:  muscle strength 5/5 throughout Sensation:  temperature and vibratory sensation intact. Deep Tendon Reflexes:  2+ throughout,  toes downgoing.   Finger to nose testing:  Without dysmetria.   Heel to shin:  Without dysmetria.   Gait:  Normal station and stride.  Romberg negative.    Thank you for allowing me to take part in the care of this patient.  Metta Clines, DO  CC: Bobby Rumpf, MD

## 2021-04-01 ENCOUNTER — Other Ambulatory Visit: Payer: Self-pay

## 2021-04-01 ENCOUNTER — Ambulatory Visit (INDEPENDENT_AMBULATORY_CARE_PROVIDER_SITE_OTHER): Payer: Self-pay | Admitting: Neurology

## 2021-04-01 ENCOUNTER — Encounter: Payer: Self-pay | Admitting: Neurology

## 2021-04-01 VITALS — BP 112/82 | HR 92 | Ht 62.0 in | Wt 117.0 lb

## 2021-04-01 DIAGNOSIS — G4486 Cervicogenic headache: Secondary | ICD-10-CM

## 2021-04-01 MED ORDER — GABAPENTIN 100 MG PO CAPS: ORAL_CAPSULE | ORAL | 0 refills | Status: DC

## 2021-04-01 NOTE — Addendum Note (Signed)
Addended by: Venetia Night on: 04/01/2021 02:35 PM   Modules accepted: Orders

## 2021-04-01 NOTE — Patient Instructions (Addendum)
1.  Start gabapentin 100mg .  Take 1 capsule at bedtime for a week, then 2 capsules at bedtime for a week, then 3 capsules at bedtime.  Contact me for refill.  If headaches not improved, I will increase dose.   2.  Follow up in 6 months.     1. Commencez la gabapentine 100 mg. Prendre 1 glule au coucher pendant une semaine, puis 2 glules au coucher pendant une semaine, puis 3 glules au coucher. Contactez-moi pour [employer] . Si les maux de tte ne s'amliorent pas, j'augmenterai la dose. 2. Suivi dans 6 mois.

## 2021-04-16 ENCOUNTER — Telehealth: Payer: Self-pay

## 2021-04-16 NOTE — Telephone Encounter (Signed)
Patient left voicemail requesting return call; unable to leave voicemail as it is full.   Amethyst Gainer Lorita Officer, RN

## 2021-04-21 NOTE — Telephone Encounter (Signed)
Patient called back. He was having difficulty picking up tivicay/symtuza. RN spoke with pharmacy. They put his prescriptions on hold, ran them again today with no issue. RN called patient back, he will go pick up the prescriptions today. Landis Gandy, RN

## 2021-06-10 ENCOUNTER — Ambulatory Visit (INDEPENDENT_AMBULATORY_CARE_PROVIDER_SITE_OTHER): Payer: Self-pay | Admitting: Infectious Diseases

## 2021-06-10 ENCOUNTER — Other Ambulatory Visit: Payer: Self-pay

## 2021-06-10 ENCOUNTER — Encounter: Payer: Self-pay | Admitting: Infectious Diseases

## 2021-06-10 ENCOUNTER — Ambulatory Visit: Payer: Self-pay

## 2021-06-10 VITALS — BP 109/78 | HR 106 | Temp 98.3°F | Wt 115.0 lb

## 2021-06-10 DIAGNOSIS — F17200 Nicotine dependence, unspecified, uncomplicated: Secondary | ICD-10-CM

## 2021-06-10 DIAGNOSIS — Z23 Encounter for immunization: Secondary | ICD-10-CM

## 2021-06-10 DIAGNOSIS — K029 Dental caries, unspecified: Secondary | ICD-10-CM

## 2021-06-10 DIAGNOSIS — Z79899 Other long term (current) drug therapy: Secondary | ICD-10-CM

## 2021-06-10 DIAGNOSIS — Z113 Encounter for screening for infections with a predominantly sexual mode of transmission: Secondary | ICD-10-CM

## 2021-06-10 DIAGNOSIS — B20 Human immunodeficiency virus [HIV] disease: Secondary | ICD-10-CM

## 2021-06-10 MED ORDER — SYMTUZA 800-150-200-10 MG PO TABS: 1.0000 | ORAL_TABLET | Freq: Every day | ORAL | 5 refills | Status: DC

## 2021-06-10 MED ORDER — TIVICAY 50 MG PO TABS: 50.0000 mg | ORAL_TABLET | Freq: Every day | ORAL | 5 refills | Status: DC

## 2021-06-10 NOTE — Assessment & Plan Note (Addendum)
Encouraged to quit.  Will try to get him patches again. Gave him number for quit line.

## 2021-06-10 NOTE — Progress Notes (Signed)
   Subjective:    Patient ID: Georgeanna Harrison, male  DOB: 1961-08-19, 60 y.o.        MRN: [id number]   HPI 60 yo [place] M with hx of AIDS, prev TB. Today he feels well.  He has been taking his medication well, denies forgotten doses. He did have a medication gap when [facility] wouldn't fill his rx. Needs refill today.   His L arm remains contracted, he did see surgery prev.    HIV 1 RNA Quant (copies/mL)  Date Value  11/28/2020 414,000  04/09/2020 <20 NOT DETECTED  12/23/2019 <20 DETECTED (A)   CD4 T Cell Abs (/uL)  Date Value  11/26/2020 131 (L)  04/09/2020 863  12/23/2019 485     Health Maintenance  Topic Date Due  . TETANUS/TDAP  Never done  . Zoster Vaccines- Shingrix (1 of 2) Never done  . COVID-19 Vaccine (3 - Pfizer risk series) 03/13/2020  . INFLUENZA VACCINE  05/31/2021  . Pneumococcal Vaccine 32-30 Years old (4 - PPSV23 or PCV20) 12/02/2023  . COLONOSCOPY (Pts 45-70yr Insurance coverage will need to be confirmed)  03/05/2027  . Hepatitis C Screening  Completed  . HIV Screening  Completed  . HPV VACCINES  Aged Out      Review of Systems  Constitutional:  Negative for weight loss.  Respiratory:  Negative for cough and shortness of breath.   Gastrointestinal:  Negative for constipation and diarrhea.  Genitourinary:  Negative for dysuria.  Psychiatric/Behavioral:  The patient has insomnia (worries).    Please see HPI. All other systems reviewed and negative.     Objective:  Physical Exam Vitals reviewed.  Constitutional:      Appearance: Normal appearance. He is obese. He is not ill-appearing or diaphoretic.  HENT:     Mouth/Throat:     Mouth: Mucous membranes are moist.     Pharynx: No oropharyngeal exudate.      Comments: Broken.  Cardiovascular:     Rate and Rhythm: Normal rate and regular rhythm.  Pulmonary:     Effort: Pulmonary effort is normal.     Breath sounds: Normal breath sounds.  Abdominal:     General: Bowel sounds are normal. There  is no distension.     Palpations: Abdomen is soft.     Tenderness: There is no abdominal tenderness.  Musculoskeletal:     Cervical back: Normal range of motion and neck supple.     Right lower leg: No edema.     Left lower leg: No edema.  Neurological:     General: No focal deficit present.     Mental Status: He is alert.          Assessment & Plan:

## 2021-06-10 NOTE — Assessment & Plan Note (Addendum)
He is filling out referral form today Alternating tylenol, ibuprofen for pain.  Dental referral placed today for [facility]. Information to schedule appointment completed today.

## 2021-06-10 NOTE — Assessment & Plan Note (Signed)
Continue on salvage rx Offered/refused condoms.  Dental eval Will check his labs today Will see him back in 9 months.  Tdap today

## 2021-06-10 NOTE — Addendum Note (Signed)
Addended by: Leatrice Jewels on: 06/10/2021 04:05 PM   Modules accepted: Orders

## 2021-06-11 LAB — T-HELPER CELL (CD4) - (RCID CLINIC ONLY)
CD4 % Helper T Cell: 19 % — ABNORMAL LOW (ref 33–65)
CD4 T Cell Abs: 582 /uL (ref 400–1790)

## 2021-06-13 LAB — HIV-1 RNA QUANT-NO REFLEX-BLD
HIV 1 RNA Quant: 103 Copies/mL — ABNORMAL HIGH
HIV-1 RNA Quant, Log: 2.01 Log cps/mL — ABNORMAL HIGH

## 2021-06-13 LAB — COMPREHENSIVE METABOLIC PANEL
AG Ratio: 1.3 (calc) (ref 1.0–2.5)
ALT: 11 U/L (ref 9–46)
AST: 16 U/L (ref 10–35)
Albumin: 4.4 g/dL (ref 3.6–5.1)
Alkaline phosphatase (APISO): 85 U/L (ref 35–144)
BUN: 18 mg/dL (ref 7–25)
CO2: 27 mmol/L (ref 20–32)
Calcium: 9.5 mg/dL (ref 8.6–10.3)
Chloride: 104 mmol/L (ref 98–110)
Creat: 1.33 mg/dL (ref 0.70–1.35)
Globulin: 3.3 g/dL (calc) (ref 1.9–3.7)
Glucose, Bld: 87 mg/dL (ref 65–99)
Potassium: 4.4 mmol/L (ref 3.5–5.3)
Sodium: 139 mmol/L (ref 135–146)
Total Bilirubin: 0.5 mg/dL (ref 0.2–1.2)
Total Protein: 7.7 g/dL (ref 6.1–8.1)

## 2021-06-13 LAB — CBC
HCT: 44.9 % (ref 38.5–50.0)
Hemoglobin: 14.6 g/dL (ref 13.2–17.1)
MCH: 27.9 pg (ref 27.0–33.0)
MCHC: 32.5 g/dL (ref 32.0–36.0)
MCV: 85.9 fL (ref 80.0–100.0)
MPV: 10.2 fL (ref 7.5–12.5)
Platelets: 322 10*3/uL (ref 140–400)
RBC: 5.23 10*6/uL (ref 4.20–5.80)
RDW: 14.5 % (ref 11.0–15.0)
WBC: 10 10*3/uL (ref 3.8–10.8)

## 2021-06-13 LAB — RPR: RPR Ser Ql: NONREACTIVE

## 2021-06-15 ENCOUNTER — Ambulatory Visit: Payer: Medicaid Other

## 2021-06-15 ENCOUNTER — Ambulatory Visit: Payer: Medicaid Other | Admitting: Infectious Diseases

## 2021-06-22 ENCOUNTER — Encounter: Payer: Self-pay | Admitting: Infectious Diseases

## 2021-08-11 ENCOUNTER — Other Ambulatory Visit: Payer: Self-pay

## 2021-08-11 DIAGNOSIS — G4700 Insomnia, unspecified: Secondary | ICD-10-CM

## 2021-08-11 DIAGNOSIS — B20 Human immunodeficiency virus [HIV] disease: Secondary | ICD-10-CM

## 2021-08-11 MED ORDER — TIVICAY 50 MG PO TABS: 50.0000 mg | ORAL_TABLET | Freq: Every day | ORAL | 5 refills | Status: DC

## 2021-08-11 MED ORDER — SYMTUZA 800-150-200-10 MG PO TABS
1.0000 | ORAL_TABLET | Freq: Every day | ORAL | 5 refills | Status: DC
Start: 2021-08-11 — End: 2022-07-11

## 2021-10-06 NOTE — Progress Notes (Signed)
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  Subjective:  Eric Jefferson is a 60 year old right-handed [place] male with HIV, diabetes and history of TB who follows up for headache  He is accompanied by a [place] speaking interpreter.  UPDATE: Started gabapentin in June. Doing well.  Headache is controlled.  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  Presented with headache, weakness and questionable altered mental status.  Reported fever at home.  CT brain showed maxillary and sphenoid sinusitis.  MRI of brain with and without contrast were negative for acute intracranial abnormality.  He was started on acyclovir empirically.  Underwent LP - CSF cell count was 14 (lymphocyte predominance) with mildly elevated protein of 62, glucose 63, negative gram stain/culture. CSF Cryptococcal antigen, fungal culture, HSV PCR and VDRL were negative.  UDS was negative.  TSH, B1, B12 and TSH were normal.  EEG was normal.  MRI of cervical and thoracic spine with and without contrast showed some degenerative changes with foraminal impingement on right at C4-5 and left at C5-6 but no cord abnormalities.     Since hospital discharge, he reports daily headaches.  It is a dull pain starting in the right paraspinal region radiating up the head and into the right eye with associated blurred vision and lacrimation in the right eye.  No nausea, vomiting, photophobia, phonophobia, ptosis, conjunctival injection, numbness or weakness.  They occur twice a day, usually in the morning and then in the afternoon.  Treats with Tylenol or Advil, but not daily.  No prior history of headache.  PAST MEDICAL  HISTORY: Past Medical History:  Diagnosis Date   Arthritis    Diabetes ([place])    02-26-20 pt denies   Headache    HIV (human immunodeficiency virus infection) ([place])    TB (pulmonary tuberculosis)    dx in 2009- took years worth of medication for this    MEDICATIONS: Current Outpatient Medications on File Prior to Visit  Medication Sig Dispense Refill   Darunavir-Cobicistat-Emtricitabine-Tenofovir Alafenamide (SYMTUZA) 800-150-200-10 MG TABS Take 1 tablet by mouth daily with breakfast. Take with Tivicay. 30 tablet 5   dolutegravir (TIVICAY) 50 MG tablet Take 1 tablet (50 mg total) by mouth daily. Take with Symtuza. 30 tablet 5   famotidine (PEPCID) 40 MG tablet TAKE 1 TABLET(40 MG) BY MOUTH DAILY (Patient not taking: No sig reported) 30 tablet 0   gabapentin (NEURONTIN) 100 MG capsule Take 1 capsule at bedtime for one week, then 2 capsules at bedtime for one week, then 3 capsules at bedtime 90 capsule 0   megestrol (MEGACE) 400 MG/10ML suspension Take 10 mLs (400 mg total) by mouth daily. (Patient not taking: Reported on 04/01/2021) 240 mL 3   [DISCONTINUED] atorvastatin (LIPITOR) 10 MG tablet TAKE 1 TABLET(10 MG) BY MOUTH DAILY (Patient not taking: Reported on 03/04/2020) 30 tablet 0   [DISCONTINUED] traZODone (DESYREL) 50 MG tablet Take 0.5 tablets (25 mg total) by mouth at bedtime as needed for sleep. (Patient not taking: Reported on 03/04/2020) 20 tablet 1   No current facility-administered medications on file prior to visit.    ALLERGIES: Allergies  Allergen Reactions   Soy Allergy  Upset stomach only- no other issues    FAMILY HISTORY: Family History  Problem Relation Age of Onset   Healthy Mother    Healthy Father    Colon cancer Neg Hx    Esophageal cancer Neg Hx    Rectal cancer Neg Hx    Stomach cancer Neg Hx       Objective:  Blood pressure 129/86, pulse 75, height 5\' 2"  (1.575 m), weight 120 lb (54.4 kg), SpO2 99 %. General: No acute distress.  Patient appears  well-groomed.     Eric Clines, DO  CC: Eric Rumpf, MD

## 2021-10-07 ENCOUNTER — Other Ambulatory Visit: Payer: Self-pay

## 2021-10-07 ENCOUNTER — Other Ambulatory Visit: Payer: Self-pay | Admitting: Neurology

## 2021-10-07 ENCOUNTER — Ambulatory Visit (INDEPENDENT_AMBULATORY_CARE_PROVIDER_SITE_OTHER): Payer: Self-pay | Admitting: Neurology

## 2021-10-07 ENCOUNTER — Other Ambulatory Visit: Payer: Self-pay | Admitting: Infectious Diseases

## 2021-10-07 ENCOUNTER — Encounter: Payer: Self-pay | Admitting: Neurology

## 2021-10-07 VITALS — BP 129/86 | HR 75 | Ht 62.0 in | Wt 120.0 lb

## 2021-10-07 DIAGNOSIS — R630 Anorexia: Secondary | ICD-10-CM

## 2021-10-07 DIAGNOSIS — R634 Abnormal weight loss: Secondary | ICD-10-CM

## 2021-10-07 DIAGNOSIS — G4486 Cervicogenic headache: Secondary | ICD-10-CM

## 2021-10-07 MED ORDER — GABAPENTIN 300 MG PO CAPS: 300.0000 mg | ORAL_CAPSULE | Freq: Every day | ORAL | 7 refills | Status: DC

## 2021-10-07 NOTE — Patient Instructions (Signed)
Take gabapentin 300mg  capsule at bedtime

## 2021-10-07 NOTE — Addendum Note (Signed)
Addended byTomi Likens, Kally Cadden R on: 10/07/2021 09:13 AM   Modules accepted: Orders

## 2021-11-09 ENCOUNTER — Telehealth: Payer: Self-pay

## 2021-11-09 NOTE — Telephone Encounter (Signed)
Patient called and said he was unable to get his medication from the pharmacy. RN called [facility], they state his medication is ready to be picked up with a $0 copay. Notified patient. Patient verbalized understanding and has no further questions.   Updated patient's phone number as an incorrect one was listed in chart.   Beryle Flock, RN

## 2022-03-03 ENCOUNTER — Other Ambulatory Visit: Payer: Medicaid Other

## 2022-03-17 ENCOUNTER — Ambulatory Visit (INDEPENDENT_AMBULATORY_CARE_PROVIDER_SITE_OTHER): Payer: Self-pay | Admitting: Infectious Diseases

## 2022-03-17 ENCOUNTER — Ambulatory Visit (INDEPENDENT_AMBULATORY_CARE_PROVIDER_SITE_OTHER): Payer: Self-pay

## 2022-03-17 ENCOUNTER — Encounter: Payer: Self-pay | Admitting: Infectious Diseases

## 2022-03-17 ENCOUNTER — Other Ambulatory Visit: Payer: Self-pay

## 2022-03-17 VITALS — BP 118/86 | HR 74 | Resp 16 | Ht 62.0 in | Wt 108.0 lb

## 2022-03-17 DIAGNOSIS — Z79899 Other long term (current) drug therapy: Secondary | ICD-10-CM

## 2022-03-17 DIAGNOSIS — K029 Dental caries, unspecified: Secondary | ICD-10-CM

## 2022-03-17 DIAGNOSIS — B20 Human immunodeficiency virus [HIV] disease: Secondary | ICD-10-CM

## 2022-03-17 DIAGNOSIS — M542 Cervicalgia: Secondary | ICD-10-CM | POA: Insufficient documentation

## 2022-03-17 DIAGNOSIS — Z113 Encounter for screening for infections with a predominantly sexual mode of transmission: Secondary | ICD-10-CM

## 2022-03-17 DIAGNOSIS — F17200 Nicotine dependence, unspecified, uncomplicated: Secondary | ICD-10-CM

## 2022-03-17 DIAGNOSIS — Z23 Encounter for immunization: Secondary | ICD-10-CM

## 2022-03-17 DIAGNOSIS — R634 Abnormal weight loss: Secondary | ICD-10-CM

## 2022-03-17 MED ORDER — MEGESTROL ACETATE 400 MG/10ML PO SUSP: 400.0000 mg | Freq: Every day | ORAL | 3 refills | Status: DC

## 2022-03-17 NOTE — Addendum Note (Signed)
Addended by: Caffie Pinto on: 03/17/2022 02:37 PM   Modules accepted: Orders

## 2022-03-17 NOTE — Assessment & Plan Note (Signed)
Will resend referral Dental referral placed today for [facility]. Information to schedule appointment completed today.

## 2022-03-17 NOTE — Assessment & Plan Note (Signed)
Megace resent.

## 2022-03-17 NOTE — Progress Notes (Signed)
   Subjective:    Patient ID: Eric Jefferson, male  DOB: [birth year], 61 y.o.        MRN: [id number]   HPI 61 yo [place] M with hx of AIDS, prev TB.  He has been taking his medication, tivicay/symtuza.   His L arm remains contracted, he did see surgery prev.   Has been having diarrhea (up to 5x/day) for last 4 days.  Has been having neck pain for last week. No preceding injury.  Has not seen dentist.  Has not quit smoking.    HIV 1 RNA Quant  Date Value  06/10/2021 103 Copies/mL (H)  11/28/2020 414,000 copies/mL  04/09/2020 <20 NOT DETECTED copies/mL   CD4 T Cell Abs (/uL)  Date Value  06/10/2021 582  11/26/2020 131 (L)  04/09/2020 863     Health Maintenance  Topic Date Due  . Zoster Vaccines- Shingrix (1 of 2) Never done  . COVID-19 Vaccine (3 - Pfizer risk series) 03/13/2020  . INFLUENZA VACCINE  05/31/2022  . COLONOSCOPY (Pts 45-89yr Insurance coverage will need to be confirmed)  03/05/2027  . TETANUS/TDAP  06/11/2031  . Hepatitis C Screening  Completed  . HIV Screening  Completed  . HPV VACCINES  Aged Out      Review of Systems  Constitutional:  Negative for chills, fever and weight loss.  Respiratory:  Negative for cough and shortness of breath.   Gastrointestinal:  Positive for diarrhea. Negative for constipation.  Genitourinary:  Negative for dysuria.  Musculoskeletal:  Positive for neck pain.   Please see HPI. All other systems reviewed and negative.     Objective:  Physical Exam Vitals reviewed.  Constitutional:      General: He is not in acute distress.    Appearance: Normal appearance. He is not ill-appearing.  HENT:     Mouth/Throat:     Mouth: Mucous membranes are moist.     Pharynx: No oropharyngeal exudate.  Eyes:     Extraocular Movements: Extraocular movements intact.     Pupils: Pupils are equal, round, and reactive to light.  Cardiovascular:     Rate and Rhythm: Normal rate and regular rhythm.  Pulmonary:     Effort: Pulmonary  effort is normal.     Breath sounds: Normal breath sounds.  Abdominal:     General: Bowel sounds are normal. There is no distension.     Palpations: Abdomen is soft.     Tenderness: There is no abdominal tenderness.  Musculoskeletal:     Cervical back: Normal range of motion and neck supple. No rigidity or tenderness.     Right lower leg: No edema.     Left lower leg: No edema.  Lymphadenopathy:     Cervical: No cervical adenopathy.  Neurological:     General: No focal deficit present.     Mental Status: He is alert.           Assessment & Plan:

## 2022-03-17 NOTE — Progress Notes (Signed)
   Covid-19 Vaccination Clinic  Name:  Eric Jefferson    MRN: [id number] DOB: 1961/10/14  03/17/2022  Eric Jefferson was observed post Covid-19 immunization for 15 minutes without incident. He was provided with Vaccine Information Sheet and instruction to access the V-Safe system.   Eric Jefferson was instructed to call 911 with any severe reactions post vaccine: Difficulty breathing  Swelling of face and throat  A fast heartbeat  A bad rash all over body  Dizziness and weakness     Redonna Wilbert CMA

## 2022-03-17 NOTE — Assessment & Plan Note (Signed)
Asked him to use NSAID, heat.

## 2022-03-17 NOTE — Assessment & Plan Note (Signed)
Will check his labs today He defers vaccines today Offered/refused condoms.  He appears to be doing well COVID booster agreed to rtc in 9 months Expect diarrhea to resolve- if not better in 1 week, asked him to let us know.

## 2022-03-17 NOTE — Assessment & Plan Note (Signed)
Encouraged to quit. 

## 2022-03-18 LAB — T-HELPER CELL (CD4) - (RCID CLINIC ONLY)
CD4 % Helper T Cell: 15 % — ABNORMAL LOW (ref 33–65)
CD4 T Cell Abs: 412 /uL (ref 400–1790)

## 2022-03-18 LAB — URINE CYTOLOGY ANCILLARY ONLY
Chlamydia: NEGATIVE
Comment: NEGATIVE
Comment: NORMAL
Neisseria Gonorrhea: NEGATIVE

## 2022-03-20 LAB — CBC
HCT: 44.4 % (ref 38.5–50.0)
Hemoglobin: 14.7 g/dL (ref 13.2–17.1)
MCH: 27.8 pg (ref 27.0–33.0)
MCHC: 33.1 g/dL (ref 32.0–36.0)
MCV: 84.1 fL (ref 80.0–100.0)
MPV: 10.3 fL (ref 7.5–12.5)
Platelets: 320 10*3/uL (ref 140–400)
RBC: 5.28 10*6/uL (ref 4.20–5.80)
RDW: 13.4 % (ref 11.0–15.0)
WBC: 9 10*3/uL (ref 3.8–10.8)

## 2022-03-20 LAB — HIV-1 RNA QUANT-NO REFLEX-BLD
HIV 1 RNA Quant: 833 copies/mL — ABNORMAL HIGH
HIV-1 RNA Quant, Log: 2.92 Log copies/mL — ABNORMAL HIGH

## 2022-03-20 LAB — COMPREHENSIVE METABOLIC PANEL
AG Ratio: 1.1 (calc) (ref 1.0–2.5)
ALT: 10 U/L (ref 9–46)
AST: 18 U/L (ref 10–35)
Albumin: 4.5 g/dL (ref 3.6–5.1)
Alkaline phosphatase (APISO): 98 U/L (ref 35–144)
BUN: 14 mg/dL (ref 7–25)
CO2: 25 mmol/L (ref 20–32)
Calcium: 9.4 mg/dL (ref 8.6–10.3)
Chloride: 105 mmol/L (ref 98–110)
Creat: 1.23 mg/dL (ref 0.70–1.35)
Globulin: 4.1 g/dL (calc) — ABNORMAL HIGH (ref 1.9–3.7)
Glucose, Bld: 86 mg/dL (ref 65–99)
Potassium: 4.4 mmol/L (ref 3.5–5.3)
Sodium: 139 mmol/L (ref 135–146)
Total Bilirubin: 0.4 mg/dL (ref 0.2–1.2)
Total Protein: 8.6 g/dL — ABNORMAL HIGH (ref 6.1–8.1)

## 2022-03-20 LAB — LIPID PANEL
Cholesterol: 276 mg/dL — ABNORMAL HIGH (ref <200)
HDL: 56 mg/dL (ref >=40)
LDL Cholesterol (Calc): 189 mg/dL (calc) — ABNORMAL HIGH
Non-HDL Cholesterol (Calc): 220 mg/dL (calc) — ABNORMAL HIGH (ref <130)
Total CHOL/HDL Ratio: 4.9 (calc) (ref <5.0)
Triglycerides: 147 mg/dL (ref <150)

## 2022-03-20 LAB — RPR: RPR Ser Ql: NONREACTIVE

## 2022-04-11 ENCOUNTER — Encounter: Payer: Self-pay | Admitting: Infectious Diseases

## 2022-06-12 NOTE — Progress Notes (Signed)
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   UPDATE: On gabapentin. Doing well.  Headache is controlled.  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  Presented with headache, weakness and questionable altered mental status.  Reported fever at home.  CT brain showed maxillary and sphenoid sinusitis.  MRI of brain with and without contrast were negative for acute intracranial abnormality.  He was started on acyclovir empirically.  Underwent LP - CSF cell count was 14 (lymphocyte predominance) with mildly elevated protein of 62, glucose 63, negative gram stain/culture. CSF Cryptococcal antigen, fungal culture, HSV PCR and VDRL were negative.  UDS was negative.  TSH, B1, B12 and TSH were normal.  EEG was normal.  MRI of cervical and thoracic spine with and without contrast showed some degenerative changes with foraminal impingement on right at C4-5 and left at C5-6 but no cord abnormalities.     Since hospital discharge, he reports daily headaches.  It is a dull pain starting in the right paraspinal region radiating up the head and into the right eye with associated blurred vision and lacrimation in the right eye.  No nausea, vomiting, photophobia, phonophobia, ptosis, conjunctival injection, numbness or weakness.  They occur twice a day, usually in the morning and then in the afternoon.  Treats with Tylenol or Advil, but not daily.  No prior history of headache.  PAST MEDICAL HISTORY: Past  Medical History:  Diagnosis Date   Arthritis    Diabetes ([place])    02-26-20 pt denies   Headache    HIV (human immunodeficiency virus infection) ([place])    TB (pulmonary tuberculosis)    dx in 2009- took years worth of medication for this    MEDICATIONS: Current Outpatient Medications on File Prior to Visit  Medication Sig Dispense Refill   Darunavir-Cobicistat-Emtricitabine-Tenofovir Alafenamide (SYMTUZA) 800-150-200-10 MG TABS Take 1 tablet by mouth daily with breakfast. Take with Tivicay. 30 tablet 5   dolutegravir (TIVICAY) 50 MG tablet Take 1 tablet (50 mg total) by mouth daily. Take with Symtuza. 30 tablet 5   famotidine (PEPCID) 40 MG tablet TAKE 1 TABLET(40 MG) BY MOUTH DAILY 30 tablet 3   gabapentin (NEURONTIN) 300 MG capsule Take 1 capsule (300 mg total) by mouth at bedtime. 30 capsule 7   megestrol (MEGACE) 400 MG/10ML suspension Take 10 mLs (400 mg total) by mouth daily. 240 mL 3   [DISCONTINUED] atorvastatin (LIPITOR) 10 MG tablet TAKE 1 TABLET(10 MG) BY MOUTH DAILY (Patient not taking: Reported on 03/04/2020) 30 tablet 0   [DISCONTINUED] traZODone (DESYREL) 50 MG tablet Take 0.5 tablets (25 mg total) by mouth at bedtime as needed for sleep. (Patient not taking: Reported on 03/04/2020) 20 tablet 1   No current facility-administered medications on file prior to visit.    ALLERGIES: Allergies  Allergen Reactions   Soy Allergy     Upset stomach only- no other issues    FAMILY HISTORY: Family History  Problem Relation Age of Onset  Healthy Mother    Healthy Father    Colon cancer Neg Hx    Esophageal cancer Neg Hx    Rectal cancer Neg Hx    Stomach cancer Neg Hx       Objective:  Blood pressure 106/74, pulse 69, height '5\' 3"'$  (1.6 m), weight 108 lb 6.4 oz (49.2 kg), SpO2 95 %. General: No acute distress.  Patient appears well-groomed.   Head:  Normocephalic/atraumatic Eyes:  Fundi examined but not visualized Neck: supple, no paraspinal tenderness, full range of  motion Heart:  Regular rate and rhythm Neurological Exam: alert and oriented to person, place, and time.  Speech fluent and not dysarthric, language intact.  CN II-XII intact. Bulk and tone normal, muscle strength 5/5 throughout.  Sensation to light touch intact.  Deep tendon reflexes 2+ throughout, toes downgoing.  Finger to nose testing intact.  Gait normal, Romberg negative.   Metta Clines, DO

## 2022-06-13 ENCOUNTER — Encounter: Payer: Self-pay | Admitting: Neurology

## 2022-06-13 ENCOUNTER — Ambulatory Visit (INDEPENDENT_AMBULATORY_CARE_PROVIDER_SITE_OTHER): Payer: Self-pay | Admitting: Neurology

## 2022-06-13 VITALS — BP 106/74 | HR 69 | Ht 63.0 in | Wt 108.4 lb

## 2022-06-13 DIAGNOSIS — G4486 Cervicogenic headache: Secondary | ICD-10-CM

## 2022-06-13 MED ORDER — GABAPENTIN 300 MG PO CAPS: 300.0000 mg | ORAL_CAPSULE | Freq: Every day | ORAL | 11 refills | Status: DC

## 2022-06-13 NOTE — Patient Instructions (Signed)
Continue gabapentin '300mg'$  at bedtime Follow up one year

## 2022-07-11 ENCOUNTER — Ambulatory Visit (INDEPENDENT_AMBULATORY_CARE_PROVIDER_SITE_OTHER): Payer: Self-pay | Admitting: Family

## 2022-07-11 ENCOUNTER — Ambulatory Visit: Payer: Self-pay

## 2022-07-11 ENCOUNTER — Other Ambulatory Visit: Payer: Self-pay

## 2022-07-11 ENCOUNTER — Encounter: Payer: Self-pay | Admitting: Family

## 2022-07-11 VITALS — BP 134/83 | HR 84 | Temp 98.3°F | Wt 112.0 lb

## 2022-07-11 DIAGNOSIS — F17210 Nicotine dependence, cigarettes, uncomplicated: Secondary | ICD-10-CM

## 2022-07-11 DIAGNOSIS — B20 Human immunodeficiency virus [HIV] disease: Secondary | ICD-10-CM

## 2022-07-11 DIAGNOSIS — Z0000 Encounter for general adult medical examination without abnormal findings: Secondary | ICD-10-CM

## 2022-07-11 DIAGNOSIS — R634 Abnormal weight loss: Secondary | ICD-10-CM

## 2022-07-11 DIAGNOSIS — Z113 Encounter for screening for infections with a predominantly sexual mode of transmission: Secondary | ICD-10-CM

## 2022-07-11 DIAGNOSIS — F17200 Nicotine dependence, unspecified, uncomplicated: Secondary | ICD-10-CM

## 2022-07-11 DIAGNOSIS — N1831 Chronic kidney disease, stage 3a: Secondary | ICD-10-CM

## 2022-07-11 DIAGNOSIS — I2510 Atherosclerotic heart disease of native coronary artery without angina pectoris: Secondary | ICD-10-CM

## 2022-07-11 MED ORDER — ROSUVASTATIN CALCIUM 5 MG PO TABS: 5.0000 mg | ORAL_TABLET | Freq: Every day | ORAL | 3 refills | Status: DC

## 2022-07-11 MED ORDER — TIVICAY 50 MG PO TABS: 50.0000 mg | ORAL_TABLET | Freq: Every day | ORAL | 5 refills | Status: DC

## 2022-07-11 MED ORDER — MEGESTROL ACETATE 400 MG/10ML PO SUSP: 400.0000 mg | Freq: Every day | ORAL | 3 refills | Status: DC

## 2022-07-11 MED ORDER — SYMTUZA 800-150-200-10 MG PO TABS: 1.0000 | ORAL_TABLET | Freq: Every day | ORAL | 5 refills | Status: DC

## 2022-07-11 NOTE — Patient Instructions (Addendum)
Nice to see you.  We will check your lab work today.  Continue to take your medication daily as prescribed.  Start taking the Crestor (rousvastatin).   Refills have been sent to the pharmacy.  Plan for follow up in 3 months or sooner if needed with lab work on the same day.  Have a great day and stay safe!

## 2022-07-11 NOTE — Assessment & Plan Note (Signed)
Weight appears to be stable and improving. BMI is currently 19. Will continue Megace until next office visit and consider discontinuing.

## 2022-07-11 NOTE — Assessment & Plan Note (Signed)
Eric Jefferson continues to smoke tobacco on a daily basis. Discussed importance of tobacco cessation to reduce risk of cardiovascular, respiratory and malignant disease in the future. A contributing factor to his elevated risk of ASCVD. In the pre-contemplation stage and not ready to quit.

## 2022-07-11 NOTE — Assessment & Plan Note (Signed)
   Discussed importance of safe sexual practice and condom use. Condoms and STD testing offered.   Due for routine dental care - will attempt to coordinate with [facility] given language barriers  Influenza vaccination received last week.

## 2022-07-11 NOTE — Assessment & Plan Note (Signed)
Eric Jefferson is at risk for cardiovascular disease with ASCVD of 13% placing him at intermediate risk. This is enhanced by continued tobacco use. Encouraged cessation. Start rosuvastatin to help reduce risk - max dose will be 10 mg with him taking Symtuza.

## 2022-07-11 NOTE — Assessment & Plan Note (Signed)
Eric Jefferson likely has poorly controlled virus secondary to less than optimal adherence as his last 30 day refill of medication was in January (confirmed with pharmacy). Unclear of previous financial assistance and has applied for [employer] and [employer] today. Reviewed previous lab work and discussed plan of care. Emphasized importance of taking medication daily as prescribed to reduce risk of disease progression and developing complications. Check lab work today including [place]. Continue current dose of Tivicay and Symtuza. Plan for follow up in 3 months or sooner if needed.

## 2022-07-11 NOTE — Assessment & Plan Note (Signed)
Most recent eGFR 65 which is normal. Will continue to monitor.

## 2022-07-11 NOTE — Progress Notes (Signed)
Brief Narrative   Patient ID: Eric Jefferson, male    DOB: Mar 21, 1961, 61 y.o.   MRN: [id number]  Eric Jefferson is a 61 y/o [place] male with HIV/AIDS diagnosed on 01/08/2008 with risk factor of heterosexual contact in the setting of TB infection with treatment completed in November 2009. Initial viral load was 52,400 and CD4 count 510. Genosure with Y181C, L210W, T215D, and A71V conferring resistance to zidovudine, didanosine, stavudine, delaviridine, efavirenz, nevirapine, and etravirine. Entered care at [facility] Stage 1. PRevious ART history with Stribild, Genvoya, Prezista/ritonavir, Prezcobix, Descovy, and currently with Symtuza and [place].   Subjective:    Chief Complaint  Patient presents with   Follow-up    HPI:  Eric Jefferson is a 61 y.o. male with HIV/AIDS last seen on 03/17/22 by Dr. [name] for routine follow up with poorly controlled virus and good tolerance to Tivicay and [place]. CD4 count was 412 with viral load 833. Renal function, hepatic function and electrolytes within normal ranges. Lipid profile with triglycerides 147, HDL 56 and LDL 189. ASCVD risk is 13.9% with recommendation for statin therapy. Eric Jefferson primary preferred language is [place] and a medical interpreter is present to aid in communication. Here today for routine follow up.   Eric Jefferson has been doing okay since his last office visit and is feeling tired. Has been taking his medications as prescribed. Eating okay with the addition of Megace.Denies fevers, chills, night sweats, headaches, changes in vision, neck pain/stiffness, nausea, diarrhea, vomiting, lesions or rashes. Applying for financial assistance today. Condoms offered. Due for routine dental care.   Allergies  Allergen Reactions   Soy Allergy     Upset stomach only- no other issues      Outpatient Medications Prior to Visit  Medication Sig Dispense Refill   Darunavir-Cobicistat-Emtricitabine-Tenofovir Alafenamide (SYMTUZA) 800-150-200-10 MG TABS Take 1  tablet by mouth daily with breakfast. Take with Tivicay. 30 tablet 5   dolutegravir (TIVICAY) 50 MG tablet Take 1 tablet (50 mg total) by mouth daily. Take with Symtuza. 30 tablet 5   megestrol (MEGACE) 400 MG/10ML suspension Take 10 mLs (400 mg total) by mouth daily. 240 mL 3   famotidine (PEPCID) 40 MG tablet TAKE 1 TABLET(40 MG) BY MOUTH DAILY 30 tablet 3   gabapentin (NEURONTIN) 300 MG capsule Take 1 capsule (300 mg total) by mouth at bedtime. 30 capsule 11   No facility-administered medications prior to visit.     Past Medical History:  Diagnosis Date   Arthritis    Diabetes ([place])    02-26-20 pt denies   Headache    HIV (human immunodeficiency virus infection) ([place])    TB (pulmonary tuberculosis)    dx in 2009- took years worth of medication for this     History reviewed. No pertinent surgical history.    Review of Systems  Constitutional:  Negative for appetite change, chills, fatigue, fever and unexpected weight change.  Eyes:  Negative for visual disturbance.  Respiratory:  Negative for cough, chest tightness, shortness of breath and wheezing.   Cardiovascular:  Negative for chest pain and leg swelling.  Gastrointestinal:  Negative for abdominal pain, constipation, diarrhea, nausea and vomiting.  Genitourinary:  Negative for dysuria, flank pain, frequency, genital sores, hematuria and urgency.  Skin:  Negative for rash.  Allergic/Immunologic: Negative for immunocompromised state.  Neurological:  Negative for dizziness and headaches.      Objective:    BP 134/83   Pulse 84   Temp 98.3 F (36.8 C) (Oral)  Wt 112 lb (50.8 kg)   BMI 19.84 kg/m  Nursing note and vital signs reviewed.  Physical Exam Constitutional:      General: He is not in acute distress.    Appearance: He is well-developed.  Eyes:     Conjunctiva/sclera: Conjunctivae normal.  Cardiovascular:     Rate and Rhythm: Normal rate and regular rhythm.     Heart sounds: Normal heart sounds. No  murmur heard.    No friction rub. No gallop.  Pulmonary:     Effort: Pulmonary effort is normal. No respiratory distress.     Breath sounds: Normal breath sounds. No wheezing or rales.  Chest:     Chest wall: No tenderness.  Abdominal:     General: Bowel sounds are normal.     Palpations: Abdomen is soft.     Tenderness: There is no abdominal tenderness.  Musculoskeletal:     Cervical back: Neck supple.  Lymphadenopathy:     Cervical: No cervical adenopathy.  Skin:    General: Skin is warm and dry.     Findings: No rash.  Neurological:     Mental Status: He is alert and oriented to person, place, and time.  Psychiatric:        Behavior: Behavior normal.        Thought Content: Thought content normal.        Judgment: Judgment normal.         07/11/2022    9:20 AM 03/17/2022    1:51 PM 12/08/2020   10:56 AM 12/23/2019    9:06 AM 07/11/2019    3:48 PM  Depression screen PHQ 2/9  Decreased Interest 0 0 0 0 0  Down, Depressed, Hopeless 0 0 0 0 0  PHQ - 2 Score 0 0 0 0 0       Assessment & Plan:    Patient Active Problem List   Diagnosis Date Noted   Healthcare maintenance 07/11/2022   Cardiovascular disease 07/11/2022   Neck pain 03/17/2022   Fall at home, sequela 01/12/2021   Headache 08/65/7846   Acute metabolic encephalopathy 96/29/5284   Transaminitis 11/26/2020   Closed left arm fracture 12/23/2019   CKD (chronic kidney disease) stage 3, GFR 30-59 ml/min ([place]) 12/23/2019   Insomnia 12/23/2019   Hyperglycemia 09/18/2018   Seasonal allergies 01/31/2018   Anorexia 07/15/2015   Otitis media 02/09/2015   Loss of weight 12/24/2012   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 This is enhanced by continued tobacco use. Encouraged cessation. Start rosuvastatin to help reduce risk - max dose will be 10 mg with him taking Symtuza.       Relevant Medications   rosuvastatin (CRESTOR) 5 MG tablet     Genitourinary   CKD (chronic kidney disease) stage 3, GFR 30-59 ml/min (HCC)    Most recent eGFR 65 which is normal. Will continue to monitor.         Other   AIDS (acquired immune deficiency syndrome) ([place]) - Primary    Eric Jefferson likely has poorly controlled virus secondary to less than optimal  adherence as his last 30 day refill of medication was in January (confirmed with pharmacy). Unclear of previous financial assistance and has applied for [employer] and [employer] today. Reviewed previous lab work and discussed plan of care. Emphasized importance of taking medication daily as prescribed to reduce risk of disease progression and developing complications. Check lab work today including [place]. Continue current dose of Tivicay and Symtuza. Plan for follow up in 3 months or sooner if needed.       Relevant Medications   Darunavir-Cobicistat-Emtricitabine-Tenofovir Alafenamide (SYMTUZA) 800-150-200-10 MG TABS   dolutegravir (TIVICAY) 50 MG tablet   megestrol (MEGACE) 400 MG/10ML suspension   Other Relevant Orders   Comprehensive metabolic panel   HIV RNA, RTPCR W/R GT (RTI, PI,INT)   T-helper cell (CD4)- ([facility] clinic only)   TOBACCO ABUSE    Eric Jefferson continues to smoke tobacco on a daily basis. Discussed importance of tobacco cessation to reduce risk of cardiovascular, respiratory and malignant disease in the future. A contributing factor to his elevated risk of ASCVD. In the pre-contemplation stage and not ready to quit.       Loss of weight    Weight appears to be stable and improving. BMI is currently 19. Will continue Megace until next office visit and  consider discontinuing.       Healthcare maintenance    Discussed importance of safe sexual practice and condom use. Condoms and STD testing offered.  Due for routine dental care - will attempt to coordinate with [facility] given language barriers Influenza vaccination received last week.       Other Visit Diagnoses     Screening for STDs (sexually transmitted diseases)       Relevant Orders   RPR       I am having Eric Jefferson start on rosuvastatin. I am also having him maintain his famotidine, gabapentin, Symtuza, Tivicay, and megestrol.   Meds ordered this encounter  Medications   rosuvastatin (CRESTOR) 5 MG tablet    Sig: Take 1 tablet (5 mg total) by mouth daily.    Dispense:  30 tablet    Refill:  3    Order Specific Question:   Supervising Provider    Answer:   Baxter Flattery, CYNTHIA [[id number]]   Darunavir-Cobicistat-Emtricitabine-Tenofovir Alafenamide (SYMTUZA) 800-150-200-10 MG TABS    Sig: Take 1 tablet by mouth daily with breakfast. Take with Tivicay.    Dispense:  30 tablet    Refill:  5    Order Specific Question:   Supervising Provider    Answer:   Baxter Flattery, CYNTHIA [[id number]]   dolutegravir (TIVICAY) 50 MG tablet    Sig: Take 1 tablet (50 mg total) by mouth daily. Take with Symtuza.    Dispense:  30 tablet    Refill:  5    Order Specific Question:   Supervising Provider    Answer:   Baxter Flattery, CYNTHIA [[id number]]   megestrol (MEGACE) 400 MG/10ML suspension    Sig: Take 10 mLs (400 mg total) by mouth daily.    Dispense:  240 mL    Refill:  3    Order Specific Question:   Supervising Provider    Answer:   Carlyle Basques [[id number]]     Follow-up: Return in about 3 months (around 10/10/2022).   Terri Piedra, MSN, FNP-C Nurse Practitioner [facility] [place] number: [phone]

## 2022-07-12 LAB — T-HELPER CELL (CD4) - (RCID CLINIC ONLY)
CD4 % Helper T Cell: 16 % — ABNORMAL LOW (ref 33–65)
CD4 T Cell Abs: 583 /uL (ref 400–1790)

## 2022-07-14 LAB — COMPREHENSIVE METABOLIC PANEL
AG Ratio: 1 (calc) (ref 1.0–2.5)
ALT: 12 U/L (ref 9–46)
AST: 18 U/L (ref 10–35)
Albumin: 4.3 g/dL (ref 3.6–5.1)
Alkaline phosphatase (APISO): 62 U/L (ref 35–144)
BUN: 21 mg/dL (ref 7–25)
CO2: 21 mmol/L (ref 20–32)
Calcium: 9.6 mg/dL (ref 8.6–10.3)
Chloride: 105 mmol/L (ref 98–110)
Creat: 1.3 mg/dL (ref 0.70–1.35)
Globulin: 4.3 g/dL (calc) — ABNORMAL HIGH (ref 1.9–3.7)
Glucose, Bld: 67 mg/dL (ref 65–99)
Potassium: 3.8 mmol/L (ref 3.5–5.3)
Sodium: 138 mmol/L (ref 135–146)
Total Bilirubin: 0.4 mg/dL (ref 0.2–1.2)
Total Protein: 8.6 g/dL — ABNORMAL HIGH (ref 6.1–8.1)

## 2022-07-14 LAB — RPR: RPR Ser Ql: NONREACTIVE

## 2022-07-14 LAB — HIV RNA, RTPCR W/R GT (RTI, PI,INT)
HIV 1 RNA Quant: 66 copies/mL — ABNORMAL HIGH
HIV-1 RNA Quant, Log: 1.82 Log copies/mL — ABNORMAL HIGH

## 2022-10-03 ENCOUNTER — Ambulatory Visit: Payer: Medicaid Other | Admitting: Family

## 2022-10-28 ENCOUNTER — Other Ambulatory Visit: Payer: Self-pay

## 2022-10-28 ENCOUNTER — Ambulatory Visit (INDEPENDENT_AMBULATORY_CARE_PROVIDER_SITE_OTHER): Payer: Self-pay | Admitting: Family

## 2022-10-28 ENCOUNTER — Encounter: Payer: Self-pay | Admitting: Family

## 2022-10-28 VITALS — BP 130/80 | HR 69 | Temp 97.8°F | Ht 63.0 in | Wt 116.0 lb

## 2022-10-28 DIAGNOSIS — B20 Human immunodeficiency virus [HIV] disease: Secondary | ICD-10-CM

## 2022-10-28 DIAGNOSIS — K2100 Gastro-esophageal reflux disease with esophagitis, without bleeding: Secondary | ICD-10-CM

## 2022-10-28 DIAGNOSIS — I2510 Atherosclerotic heart disease of native coronary artery without angina pectoris: Secondary | ICD-10-CM

## 2022-10-28 DIAGNOSIS — Z23 Encounter for immunization: Secondary | ICD-10-CM

## 2022-10-28 DIAGNOSIS — Z113 Encounter for screening for infections with a predominantly sexual mode of transmission: Secondary | ICD-10-CM

## 2022-10-28 DIAGNOSIS — Z0000 Encounter for general adult medical examination without abnormal findings: Secondary | ICD-10-CM

## 2022-10-28 DIAGNOSIS — F17210 Nicotine dependence, cigarettes, uncomplicated: Secondary | ICD-10-CM

## 2022-10-28 MED ORDER — ROSUVASTATIN CALCIUM 5 MG PO TABS
5.0000 mg | ORAL_TABLET | Freq: Every day | ORAL | 3 refills | Status: DC
Start: 2022-10-28 — End: 2023-02-20

## 2022-10-28 MED ORDER — FAMOTIDINE 40 MG PO TABS: ORAL_TABLET | ORAL | 3 refills | Status: DC

## 2022-10-28 MED ORDER — SYMTUZA 800-150-200-10 MG PO TABS: 1.0000 | ORAL_TABLET | Freq: Every day | ORAL | 5 refills | Status: DC

## 2022-10-28 MED ORDER — TIVICAY 50 MG PO TABS: 50.0000 mg | ORAL_TABLET | Freq: Every day | ORAL | 5 refills | Status: DC

## 2022-10-28 NOTE — Assessment & Plan Note (Signed)
Derrin continues to take rosuvastatin with no adverse side effects. Encouraged tobacco cessation as this increases risk for cardiovascular disease. Check hepatic function for therapeutic drug monitoring. Continue current dose of rosuvastatin.

## 2022-10-28 NOTE — Progress Notes (Signed)
Brief Narrative   Patient ID: Eric Eric Jefferson, male    DOB: 10/31/1961, 61 y.o.   MRN: [id number]  Eric Eric Jefferson is a 61 y/o [place] male with HIV/AIDS diagnosed on 01/08/2008 with risk factor of heterosexual contact in the setting of TB infection with treatment completed in November 2009. Initial viral load was 52,400 and CD4 count 510. Genosure with Y181C, L210W, T215D, and A71V conferring resistance to zidovudine, didanosine, stavudine, delaviridine, efavirenz, nevirapine, and etravirine. Entered care at [facility] Stage 1. PRevious ART history with Stribild, Genvoya, Prezista/ritonavir, Prezcobix, Descovy, and currently with Symtuza and [place].    Subjective:    Chief Complaint  Patient presents with   Follow-up    HPI:  Eric Eric Jefferson is a 61 y.o. male with HIV/AIDS last seen on 07/11/22 with good adherence and tolerance to Symtuza and Tivicay with improved viral control with viral load improving from 833 to 66. CD4 count was 583. RPR was non-reactive for syphilis. Renal function, electrolytes and hepatic function were within normal ranges. Here today for routine follow up. Eric Eric Jefferson's primary preferred language is [place] and a medical interpreter is present to aid in communication.  Eric Eric Jefferson been doing okay since his last office visit and Eric Jefferson Eric Jefferson some stomach issues which are possibly related to reflux as he is no longer taking famotidine. Eric Jefferson to take Tivicay and Symtuza with no adverse side effects or problems obtaining medication from the pharmacy. Denies muscle pain with rosuvastatin. Spent the holiday visiting and helping at churches.   Denies fevers, chills, night sweats, headaches, changes in vision, neck pain/stiffness, nausea, diarrhea, vomiting, lesions or rashes.  Allergies  Allergen Reactions   Soy Allergy     Upset stomach only- no other issues      Outpatient Medications Prior to Visit  Medication Sig Dispense Refill   gabapentin (NEURONTIN) 300 MG capsule Take 1 capsule (300  mg total) by mouth at bedtime. 30 capsule 11   megestrol (MEGACE) 400 MG/10ML suspension Take 10 mLs (400 mg total) by mouth daily. 240 mL 3   Darunavir-Cobicistat-Emtricitabine-Tenofovir Alafenamide (SYMTUZA) 800-150-200-10 MG TABS Take 1 tablet by mouth daily with breakfast. Take with Tivicay. 30 tablet 5   dolutegravir (TIVICAY) 50 MG tablet Take 1 tablet (50 mg total) by mouth daily. Take with Symtuza. 30 tablet 5   famotidine (PEPCID) 40 MG tablet TAKE 1 TABLET(40 MG) BY MOUTH DAILY 30 tablet 3   rosuvastatin (CRESTOR) 5 MG tablet Take 1 tablet (5 mg total) by mouth daily. 30 tablet 3   No facility-administered medications prior to visit.     Past Medical History:  Diagnosis Date   Arthritis    Diabetes ([place])    02-26-20 pt denies   Headache    HIV (human immunodeficiency virus infection) ([employer])    TB (pulmonary tuberculosis)    dx in 2009- took years worth of medication for this     History reviewed. No pertinent surgical history.    Review of Systems  Constitutional:  Negative for appetite change, chills, fatigue, fever and unexpected weight change.  Eyes:  Negative for visual disturbance.  Respiratory:  Negative for cough, chest tightness, shortness of breath and wheezing.   Cardiovascular:  Negative for chest pain and leg swelling.  Gastrointestinal:  Negative for abdominal pain, constipation, diarrhea, nausea and vomiting.  Genitourinary:  Negative for dysuria, flank pain, frequency, genital sores, hematuria and urgency.  Skin:  Negative for rash.  Allergic/Immunologic: Negative for immunocompromised state.  Neurological:  Negative for dizziness  and headaches.      Objective:    BP 130/80   Pulse 69   Temp 97.8 F (36.6 C) (Temporal)   Ht '5\' 3"'$  (1.6 m)   Wt 116 lb (52.6 kg)   SpO2 99%   BMI 20.55 kg/m  Nursing note and vital signs reviewed.  Physical Exam Constitutional:      General: He is not in acute distress.    Appearance: He is well-developed.   Eyes:     Conjunctiva/sclera: Conjunctivae normal.  Cardiovascular:     Rate and Rhythm: Normal rate and regular rhythm.     Heart sounds: Normal heart sounds. No murmur heard.    No friction rub. No gallop.  Pulmonary:     Effort: Pulmonary effort is normal. No respiratory distress.     Breath sounds: Normal breath sounds. No wheezing or rales.  Chest:     Chest wall: No tenderness.  Abdominal:     General: Bowel sounds are normal.     Palpations: Abdomen is soft.     Tenderness: There is no abdominal tenderness.  Musculoskeletal:     Cervical back: Neck supple.  Lymphadenopathy:     Cervical: No cervical adenopathy.  Skin:    General: Skin is warm and dry.     Findings: No rash.  Neurological:     Mental Status: He is alert and oriented to person, place, and time.  Psychiatric:        Behavior: Behavior normal.        Thought Content: Thought content normal.        Judgment: Judgment normal.         10/28/2022   11:00 AM 07/11/2022    9:20 AM 03/17/2022    1:51 PM 12/08/2020   10:56 AM 12/23/2019    9:06 AM  Depression screen PHQ 2/9  Decreased Interest 0 0 0 0 0  Down, Depressed, Hopeless 0 0 0 0 0  PHQ - 2 Score 0 0 0 0 0       Assessment & Plan:    Patient Active Problem List   Diagnosis Date Noted   Healthcare maintenance 07/11/2022   Cardiovascular disease 07/11/2022   Neck pain 03/17/2022   Fall at home, sequela 01/12/2021   Headache 38/75/6433   Acute metabolic encephalopathy 29/51/8841   Transaminitis 11/26/2020   Closed left arm fracture 12/23/2019   CKD (chronic kidney disease) stage 3, GFR 30-59 ml/min ([place]) 12/23/2019   Insomnia 12/23/2019   Hyperglycemia 09/18/2018   Seasonal allergies 01/31/2018   Anorexia 07/15/2015   Otitis media 02/09/2015   Loss of weight 12/24/2012   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 Encouraged tobacco cessation as this increases risk for cardiovascular disease. Check hepatic function for therapeutic drug monitoring. Continue current dose of rosuvastatin.       Relevant Medications   rosuvastatin (CRESTOR) 5 MG tablet     Digestive   GERD    Eric Eric Jefferson to Eric Jefferson exacerbation of GERD. Will restart famotidine. May need to consider additional evaluation if refractory to restarting medication.       Relevant  Medications   famotidine (PEPCID) 40 MG tablet     Other   AIDS (acquired immune deficiency syndrome) ([place]) - Primary    Eric Eric Jefferson improved viral control and now less than <200 with current dose of Symtuza/Tivicay. Unclear if he will be able to reach full viral suppression or if he will remain with low level viremia .Eric Jefferson to Eric Jefferson good adherence to regimen. Reviewed previous lab work and discussed plan of care. Check lab work. Continue current dose of Symtuza and Tivicay. Plan for follow up in 4 months or sooner if needed with lab work on the same day.       Relevant Medications   Darunavir-Cobicistat-Emtricitabine-Tenofovir Alafenamide (SYMTUZA) 800-150-200-10 MG TABS   dolutegravir (TIVICAY) 50 MG tablet   Other Relevant Orders   COMPLETE METABOLIC PANEL WITH GFR   HIV-1 RNA quant-no reflex-bld   T-helper cells (CD4) count (not at [facility])   Healthcare maintenance    Discussed importance of safe sexual practice and condom use. Condoms and STD testing offered.  Influenza vaccination updated.       Other Visit Diagnoses     Screening for STDs (sexually transmitted diseases)       Relevant Orders   RPR   Need for immunization against influenza       Relevant Orders   Flu Vaccine QUAD 72moIM (Fluarix, Fluzone &  Alfiuria Quad PF) (Completed)        I am having Eric Eric Jefferson maintain his gabapentin, megestrol, Symtuza, Tivicay, famotidine, and rosuvastatin.   Meds ordered this encounter  Medications   Darunavir-Cobicistat-Emtricitabine-Tenofovir Alafenamide (SYMTUZA) 800-150-200-10 MG TABS    Sig: Take 1 tablet by mouth daily with breakfast. Take with Tivicay.    Dispense:  30 tablet    Refill:  5    Order Specific Question:   Supervising Provider    Answer:   SBaxter Flattery CYNTHIA [[id number]]   dolutegravir (TIVICAY) 50 MG tablet    Sig: Take 1 tablet (50 mg total) by mouth daily. Take with Symtuza.    Dispense:  30 tablet    Refill:  5    Order Specific Question:   Supervising Provider    Answer:   SBaxter Flattery CYNTHIA [[id number]]   famotidine (PEPCID) 40 MG tablet    Sig: TAKE 1 TABLET(40 MG) BY MOUTH DAILY    Dispense:  30 tablet    Refill:  3    Order Specific Question:   Supervising Provider    Answer:   SBaxter Flattery CYNTHIA [[id number]]   rosuvastatin (CRESTOR) 5 MG tablet    Sig: Take 1 tablet (5 mg total) by mouth daily.    Dispense:  30 tablet    Refill:  3    Order Specific Question:   Supervising Provider    Answer:   SCarlyle Basques[[id number]]     Follow-up: Return in about 4 months (around 02/27/2023), or if symptoms worsen or fail to improve.   GTerri Piedra MSN, FNP-C Nurse Practitioner [facility] [place]number: [phone]

## 2022-10-28 NOTE — Patient Instructions (Signed)
Nice to see you.  We will check your lab work today.  Continue to take your medication daily as prescribed.  Refills have been sent to the pharmacy.  Plan for follow up in 4 months or sooner if needed with lab work on the same day.  Have a great day and stay safe!  

## 2022-10-28 NOTE — Assessment & Plan Note (Signed)
Discussed importance of safe sexual practice and condom use. Condoms and STD testing offered.  Influenza vaccination updated.  

## 2022-10-28 NOTE — Assessment & Plan Note (Signed)
Eric Jefferson appears to have exacerbation of GERD. Will restart famotidine. May need to consider additional evaluation if refractory to restarting medication.

## 2022-10-28 NOTE — Assessment & Plan Note (Addendum)
Eric Jefferson has improved viral control and now less than <200 with current dose of Symtuza/Tivicay. Unclear if he will be able to reach full viral suppression or if he will remain with low level viremia .Appears to have good adherence to regimen. Reviewed previous lab work and discussed plan of care. Check lab work. Continue current dose of Symtuza and Tivicay. Plan for follow up in 4 months or sooner if needed with lab work on the same day.

## 2022-11-01 LAB — COMPLETE METABOLIC PANEL WITH GFR
AG Ratio: 1.3 (calc) (ref 1.0–2.5)
ALT: 10 U/L (ref 9–46)
AST: 15 U/L (ref 10–35)
Albumin: 4.7 g/dL (ref 3.6–5.1)
Alkaline phosphatase (APISO): 85 U/L (ref 35–144)
BUN: 16 mg/dL (ref 7–25)
CO2: 27 mmol/L (ref 20–32)
Calcium: 9.6 mg/dL (ref 8.6–10.3)
Chloride: 104 mmol/L (ref 98–110)
Creat: 1.27 mg/dL (ref 0.70–1.35)
Globulin: 3.6 g/dL (calc) (ref 1.9–3.7)
Glucose, Bld: 77 mg/dL (ref 65–99)
Potassium: 4.8 mmol/L (ref 3.5–5.3)
Sodium: 140 mmol/L (ref 135–146)
Total Bilirubin: 0.3 mg/dL (ref 0.2–1.2)
Total Protein: 8.3 g/dL — ABNORMAL HIGH (ref 6.1–8.1)
eGFR: 63 mL/min/{1.73_m2} (ref >=60)

## 2022-11-01 LAB — HIV-1 RNA QUANT-NO REFLEX-BLD
HIV 1 RNA Quant: 32 Copies/mL — ABNORMAL HIGH
HIV-1 RNA Quant, Log: 1.51 Log cps/mL — ABNORMAL HIGH

## 2022-11-01 LAB — T-HELPER CELLS (CD4) COUNT (NOT AT ARMC)
Absolute CD4: 445 cells/uL — ABNORMAL LOW (ref 490–1740)
CD4 T Helper %: 16 % — ABNORMAL LOW (ref 30–61)
Total lymphocyte count: 2723 cells/uL (ref 850–3900)

## 2022-11-01 LAB — RPR: RPR Ser Ql: NONREACTIVE

## 2022-12-29 IMAGING — CT CT HEAD W/O CM
4 series · 17 of 47 positions shown, 19 images · non-contrast
Comparison: 06/17/2016

CLINICAL DATA: Weakness, altered level of consciousness for 3 days,
nonverbal

EXAM:
CT HEAD WITHOUT CONTRAST
TECHNIQUE: Contiguous axial images were obtained from the base of the skull
through the vertex without intravenous contrast.

[Series 3: head wo · axial · 0.41mm/px · z∈[-39,+81]mm · 7 of 32 slices shown, 9 images]
[im 4/32  brain]
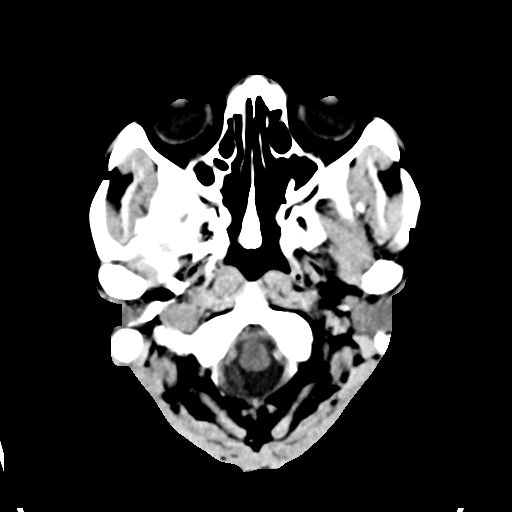
[im 4/32  bone]
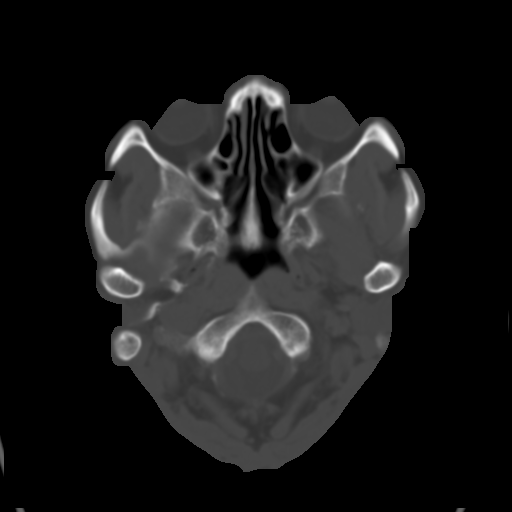
[im 8/32  brain]
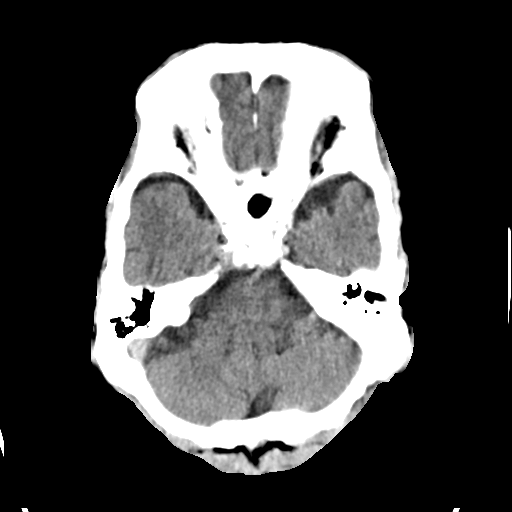
[im 12/32  brain]
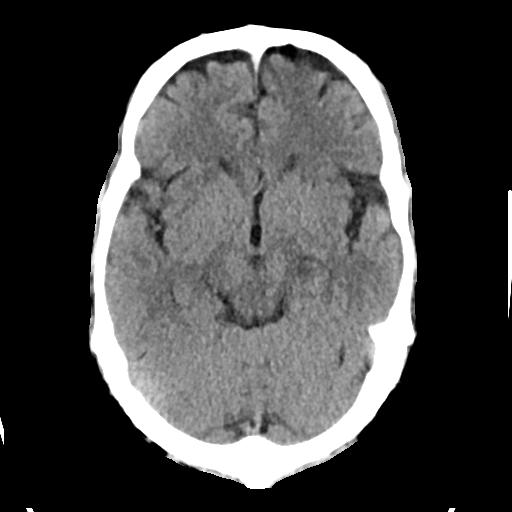
[im 16/32  brain]
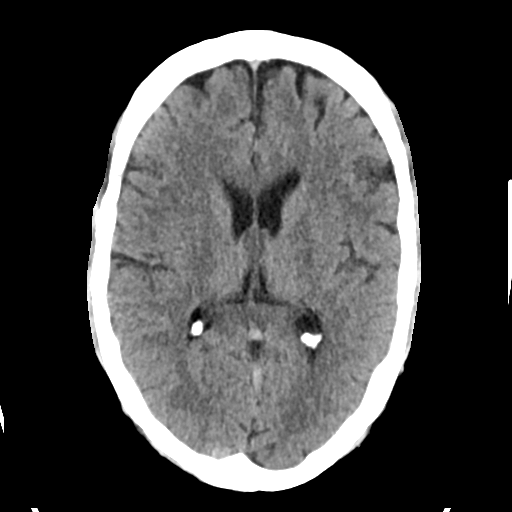
[im 20/32  brain]
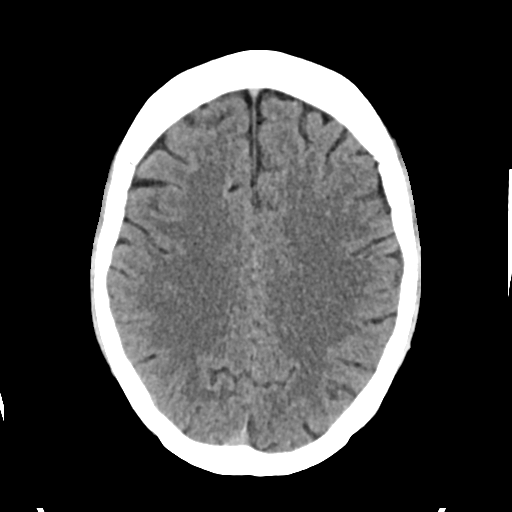
[im 20/32  bone]
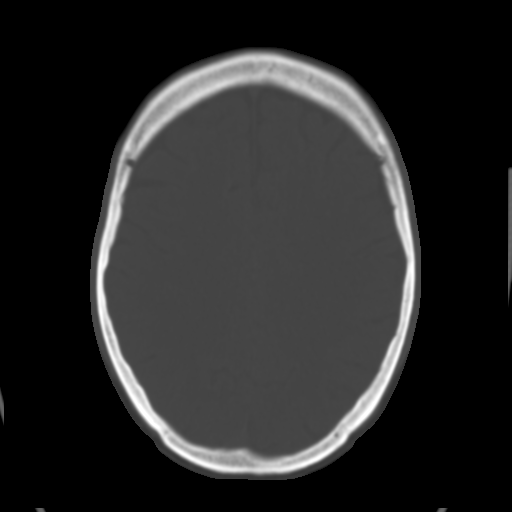
[im 24/32  brain]
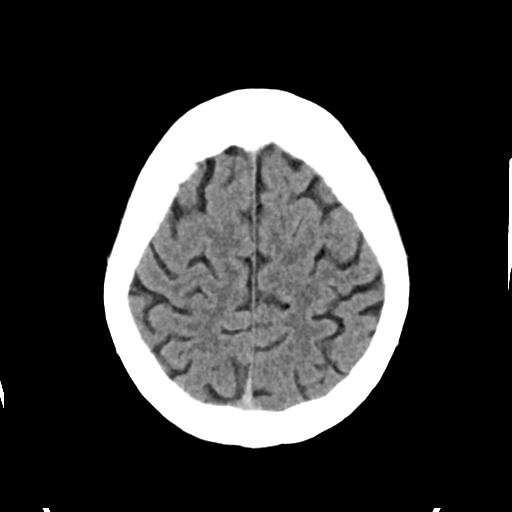
[im 28/32  brain]
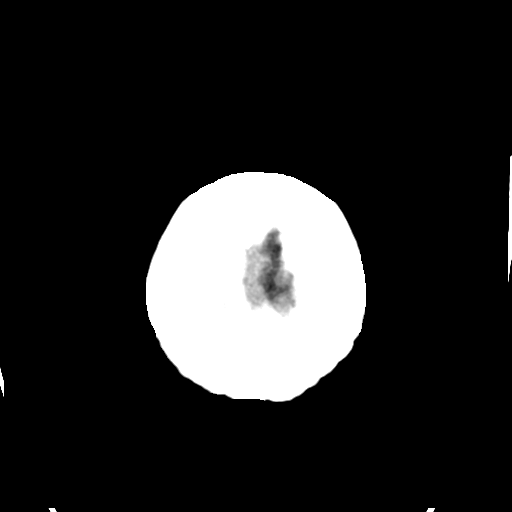

[Series 4: head bone · axial · 0.41mm/px · z∈[-40,+16]mm · 4 of 79 slices shown]
[im 8/79  bone]
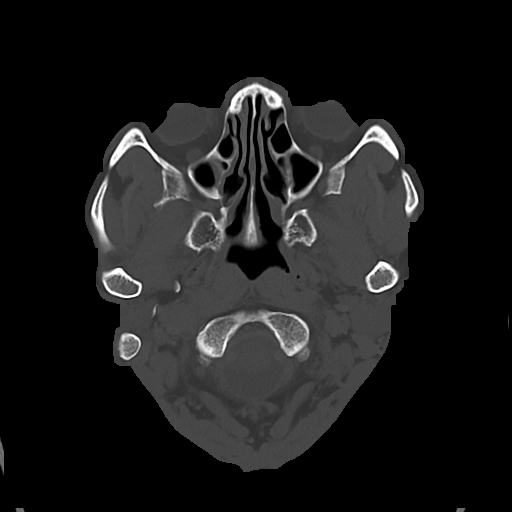
[im 16/79  bone]
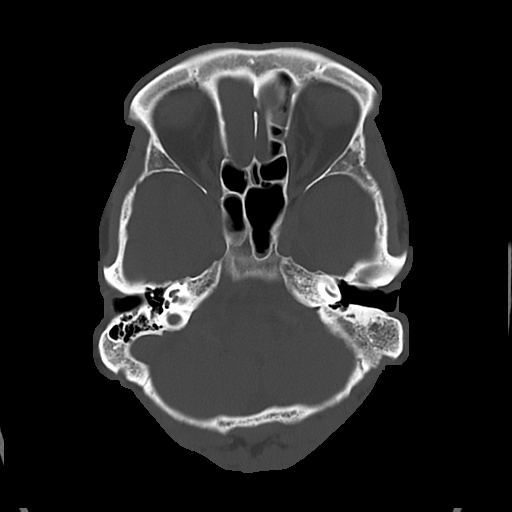
[im 24/79  bone]
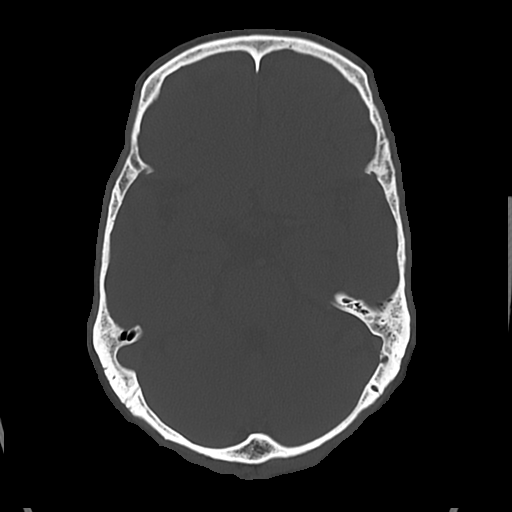
[im 36/79  bone]
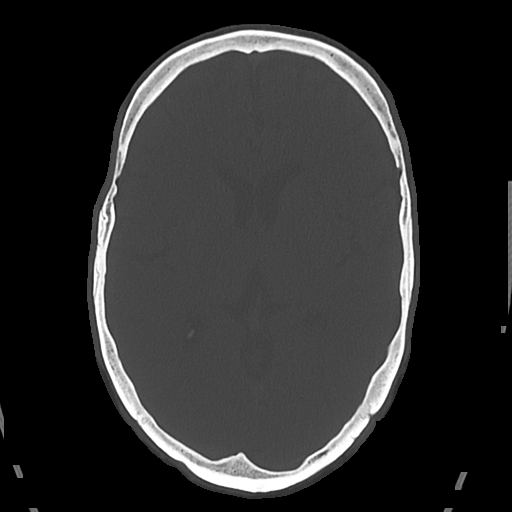

[Series 5: cor soft · coronal · 0.31mm/px · 3 of 68 slices shown]
[im 23/68  brain]
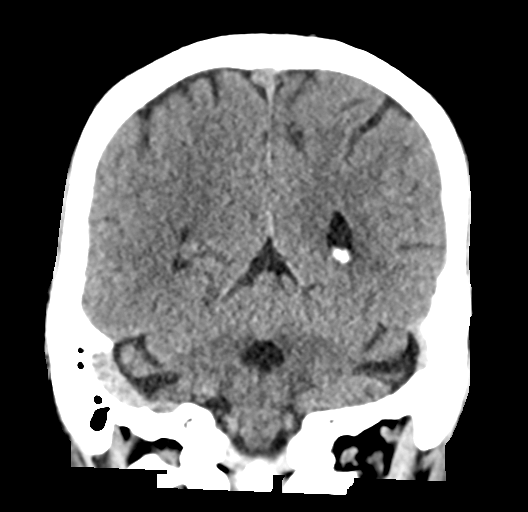
[im 30/68  brain]
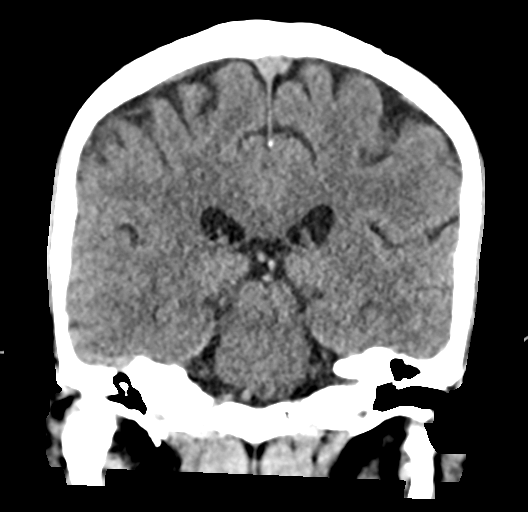
[im 38/68  brain]
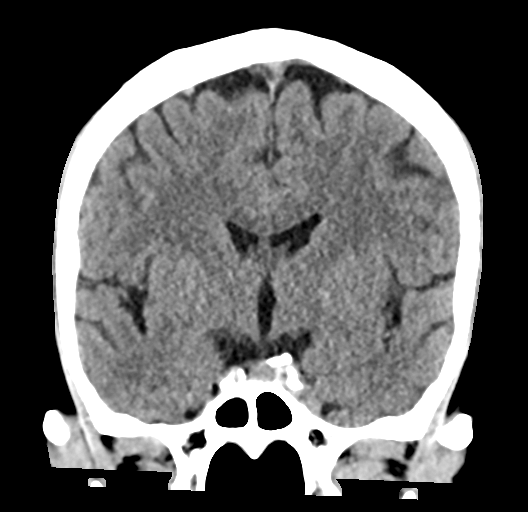

[Series 6: sag soft · sagittal · 0.31mm/px · 3 of 51 slices shown]
[im 17/51  brain]
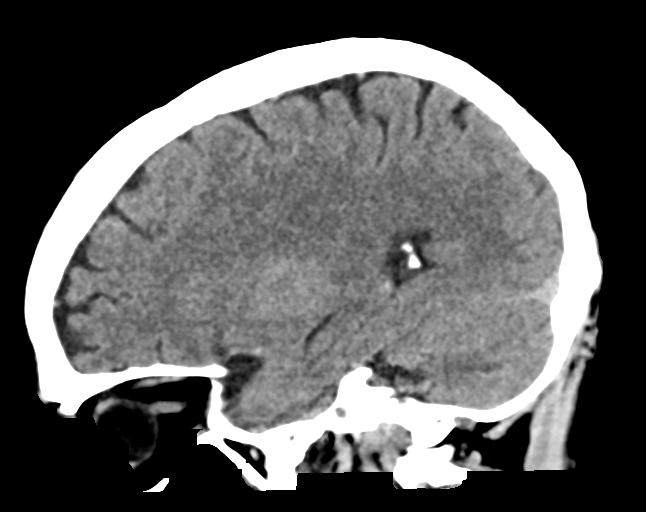
[im 26/51  brain]
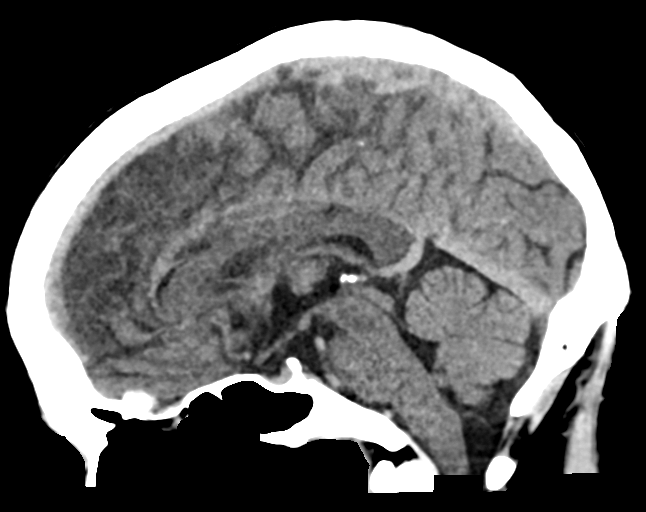
[im 34/51  brain]
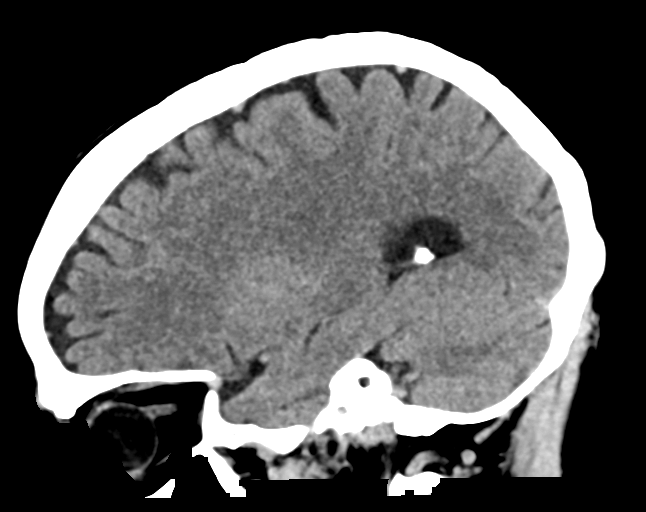

[17 of 47 positions shown; findings below may reference images not displayed]

FINDINGS: Brain: No acute infarct or hemorrhage. Lateral ventricles and
midline structures are unremarkable. No acute extra-axial fluid
collections. No mass effect.

Vascular: No hyperdense vessel or unexpected calcification.

Skull: Normal. Negative for fracture or focal lesion.

Sinuses/Orbits: Small gas fluid levels within the right maxillary
and sphenoid sinuses. Mucoperiosteal thickening throughout the
ethmoid air cells.

Other: None.
IMPRESSION: 1. No acute intracranial process.
2. Right maxillary and sphenoid sinusitis.

## 2023-02-20 ENCOUNTER — Encounter: Payer: Self-pay | Admitting: Family

## 2023-02-20 ENCOUNTER — Ambulatory Visit (INDEPENDENT_AMBULATORY_CARE_PROVIDER_SITE_OTHER): Payer: Self-pay | Admitting: Family

## 2023-02-20 ENCOUNTER — Other Ambulatory Visit: Payer: Self-pay

## 2023-02-20 VITALS — BP 132/86 | HR 76 | Wt 116.0 lb

## 2023-02-20 DIAGNOSIS — I2510 Atherosclerotic heart disease of native coronary artery without angina pectoris: Secondary | ICD-10-CM

## 2023-02-20 DIAGNOSIS — F17210 Nicotine dependence, cigarettes, uncomplicated: Secondary | ICD-10-CM

## 2023-02-20 DIAGNOSIS — F17200 Nicotine dependence, unspecified, uncomplicated: Secondary | ICD-10-CM

## 2023-02-20 DIAGNOSIS — Z113 Encounter for screening for infections with a predominantly sexual mode of transmission: Secondary | ICD-10-CM

## 2023-02-20 DIAGNOSIS — B20 Human immunodeficiency virus [HIV] disease: Secondary | ICD-10-CM

## 2023-02-20 DIAGNOSIS — M545 Low back pain, unspecified: Secondary | ICD-10-CM

## 2023-02-20 DIAGNOSIS — Z79899 Other long term (current) drug therapy: Secondary | ICD-10-CM

## 2023-02-20 DIAGNOSIS — J302 Other seasonal allergic rhinitis: Secondary | ICD-10-CM

## 2023-02-20 MED ORDER — ROSUVASTATIN CALCIUM 5 MG PO TABS: 5.0000 mg | ORAL_TABLET | Freq: Every day | ORAL | 5 refills | Status: DC

## 2023-02-20 MED ORDER — TIVICAY 50 MG PO TABS: 50.0000 mg | ORAL_TABLET | Freq: Every day | ORAL | 5 refills | Status: DC

## 2023-02-20 MED ORDER — SYMTUZA 800-150-200-10 MG PO TABS: 1.0000 | ORAL_TABLET | Freq: Every day | ORAL | 5 refills | Status: DC

## 2023-02-20 NOTE — Assessment & Plan Note (Signed)
Continues to smoke tobacco and discussed importance of tobacco cessation and reducing risk of cardiovascular, respiratory and malignant disease in the future. In the pre-contemplation stage of change and not ready to quit.

## 2023-02-20 NOTE — Patient Instructions (Addendum)
Nice to see you.  We will check your lab work today.  Continue to take your medication daily as prescribed.  Refills have been sent to the pharmacy.  Plan for follow up in 4 months or sooner if needed with lab work on the same day.  Have a great day and stay safe!  For your back ice/heat x 20 minutes every 2 hours as needed.  Stretches and exercise throughout the day.  For your seasonal allergies recommend Claratin, Allegra, Zyrtec or Xyzal or their generics.   Low Back Sprain or Strain Rehab Ask your health care provider which exercises are safe for you. Do exercises exactly as told by your health care provider and adjust them as directed. It is normal to feel mild stretching, pulling, tightness, or discomfort as you do these exercises. Stop right away if you feel sudden pain or your pain gets worse. Do not begin these exercises until told by your health care provider. Stretching and range-of-motion exercises These exercises warm up your muscles and joints and improve the movement and flexibility of your back. These exercises also help to relieve pain, numbness, and tingling. Lumbar rotation  Lie on your back on a firm bed or the floor with your knees bent. Straighten your arms out to your sides so each arm forms a 90-degree angle (right angle) with a side of your body. Slowly move (rotate) both of your knees to one side of your body until you feel a stretch in your lower back (lumbar). Try not to let your shoulders lift off the floor. Hold this position for __________ seconds. Tense your abdominal muscles and slowly move your knees back to the starting position. Repeat this exercise on the other side of your body. Repeat __________ times. Complete this exercise __________ times a day. Single knee to chest  Lie on your back on a firm bed or the floor with both legs straight. Bend one of your knees. Use your hands to move your knee up toward your chest until you feel a gentle  stretch in your lower back and buttock. Hold your leg in this position by holding on to the front of your knee. Keep your other leg as straight as possible. Hold this position for __________ seconds. Slowly return to the starting position. Repeat with your other leg. Repeat __________ times. Complete this exercise __________ times a day. Prone extension on elbows  Lie on your abdomen on a firm bed or the floor (prone position). Prop yourself up on your elbows. Use your arms to help lift your chest up until you feel a gentle stretch in your abdomen and your lower back. This will place some of your body weight on your elbows. If this is uncomfortable, try stacking pillows under your chest. Your hips should stay down, against the surface that you are lying on. Keep your hip and back muscles relaxed. Hold this position for __________ seconds. Slowly relax your upper body and return to the starting position. Repeat __________ times. Complete this exercise __________ times a day. Strengthening exercises These exercises build strength and endurance in your back. Endurance is the ability to use your muscles for a long time, even after they get tired. Pelvic tilt This exercise strengthens the muscles that lie deep in the abdomen. Lie on your back on a firm bed or the floor with your legs extended. Bend your knees so they are pointing toward the ceiling and your feet are flat on the floor. Tighten your lower abdominal  muscles to press your lower back against the floor. This motion will tilt your pelvis so your tailbone points up toward the ceiling instead of pointing to your feet or the floor. To help with this exercise, you may place a small towel under your lower back and try to push your back into the towel. Hold this position for __________ seconds. Let your muscles relax completely before you repeat this exercise. Repeat __________ times. Complete this exercise __________ times a  day. Alternating arm and leg raises  Get on your hands and knees on a firm surface. If you are on a hard floor, you may want to use padding, such as an exercise mat, to cushion your knees. Line up your arms and legs. Your hands should be directly below your shoulders, and your knees should be directly below your hips. Lift your left leg behind you. At the same time, raise your right arm and straighten it in front of you. Do not lift your leg higher than your hip. Do not lift your arm higher than your shoulder. Keep your abdominal and back muscles tight. Keep your hips facing the ground. Do not arch your back. Keep your balance carefully, and do not hold your breath. Hold this position for __________ seconds. Slowly return to the starting position. Repeat with your right leg and your left arm. Repeat __________ times. Complete this exercise __________ times a day. Abdominal set with straight leg raise  Lie on your back on a firm bed or the floor. Bend one of your knees and keep your other leg straight. Tense your abdominal muscles and lift your straight leg up, 4-6 inches (10-15 cm) off the ground. Keep your abdominal muscles tight and hold this position for __________ seconds. Do not hold your breath. Do not arch your back. Keep it flat against the ground. Keep your abdominal muscles tense as you slowly lower your leg back to the starting position. Repeat with your other leg. Repeat __________ times. Complete this exercise __________ times a day. Single leg lower with bent knees Lie on your back on a firm bed or the floor. Tense your abdominal muscles and lift your feet off the floor, one foot at a time, so your knees and hips are bent in 90-degree angles (right angles). Your knees should be over your hips and your lower legs should be parallel to the floor. Keeping your abdominal muscles tense and your knee bent, slowly lower one of your legs so your toe touches the ground. Lift your  leg back up to return to the starting position. Do not hold your breath. Do not let your back arch. Keep your back flat against the ground. Repeat with your other leg. Repeat __________ times. Complete this exercise __________ times a day. Posture and body mechanics Good posture and healthy body mechanics can help to relieve stress in your body's tissues and joints. Body mechanics refers to the movements and positions of your body while you do your daily activities. Posture is part of body mechanics. Good posture means: Your spine is in its natural S-curve position (neutral). Your shoulders are pulled back slightly. Your head is not tipped forward (neutral). Follow these guidelines to improve your posture and body mechanics in your everyday activities. Standing  When standing, keep your spine neutral and your feet about hip-width apart. Keep a slight bend in your knees. Your ears, shoulders, and hips should line up. When you do a task in which you stand in one place for  a long time, place one foot up on a stable object that is 2-4 inches (5-10 cm) high, such as a footstool. This helps keep your spine neutral. Sitting  When sitting, keep your spine neutral and keep your feet flat on the floor. Use a footrest, if necessary, and keep your thighs parallel to the floor. Avoid rounding your shoulders, and avoid tilting your head forward. When working at a desk or a computer, keep your desk at a height where your hands are slightly lower than your elbows. Slide your chair under your desk so you are close enough to maintain good posture. When working at a computer, place your monitor at a height where you are looking straight ahead and you do not have to tilt your head forward or downward to look at the screen. Resting When lying down and resting, avoid positions that are most painful for you. If you have pain with activities such as sitting, bending, stooping, or squatting, lie in a position in which  your body does not bend very much. For example, avoid curling up on your side with your arms and knees near your chest (fetal position). If you have pain with activities such as standing for a long time or reaching with your arms, lie with your spine in a neutral position and bend your knees slightly. Try the following positions: Lying on your side with a pillow between your knees. Lying on your back with a pillow under your knees. Lifting  When lifting objects, keep your feet at least shoulder-width apart and tighten your abdominal muscles. Bend your knees and hips and keep your spine neutral. It is important to lift using the strength of your legs, not your back. Do not lock your knees straight out. Always ask for help to lift heavy or awkward objects. This information is not intended to replace advice given to you by your health care provider. Make sure you discuss any questions you have with your health care provider. Document Revised: 01/04/2021 Document Reviewed: 01/04/2021 Elsevier Patient Education  2023 [employer].

## 2023-02-20 NOTE — Assessment & Plan Note (Signed)
Eric Jefferson back pain signs/symptoms appears consistent with muscular origin although cannot rule out underlying pathology. No neurological deficit. Treat conservatively with ice/heat and home exercise therapy (provided in AVS). May need additional evaluation if symptoms do not improve.

## 2023-02-20 NOTE — Assessment & Plan Note (Signed)
Mr. Loken symptoms and exam are consistent with exacerbation of seasonal allergies. Recommend OTC antihistamine as first line treatment with other OTC medications as needed for symptom relief and supportive care. Follow up if symptoms worsen or do not improve.

## 2023-02-20 NOTE — Progress Notes (Signed)
Brief Narrative   Patient ID: Eric Jefferson, male    DOB: 10/25/1961, 62 y.o.   MRN: [id number]  Eric Jefferson is a 62 y/o [place] male with HIV/AIDS diagnosed on 01/08/2008 with risk factor of heterosexual contact in the setting of TB infection with treatment completed in November 2009. Initial viral load was 52,400 and CD4 count 510. Genosure with Y181C, L210W, T215D, and A71V conferring resistance to zidovudine, didanosine, stavudine, delaviridine, efavirenz, nevirapine, and etravirine. Entered care at [facility] Stage 1. PRevious ART history with Stribild, Genvoya, Prezista/ritonavir, Prezcobix, Descovy, and currently with Symtuza and Tivicay.    Subjective:    Chief Complaint  Patient presents with   Follow-up    B20    HPI:  Eric Jefferson is a 62 y.o. male with HIV/AIDS last seen on 10/28/22 with well controlled virus and good adherence and tolerance to [place] and Tivicay. Viral load was undetectable and CD4 count 445. Renal function, liver function and electrolytes within normal ranges.Eric Jefferson primary preferred language is [place]agn[place] and medical translator is present to aid in communication. Here today for follow up.   Eric Jefferson has been been doing okay since his last office visit has a couple of concerns today. He is currently living in [place]. Has been having increased sneezing, coughing, and itchy, watery eyes over the last several weeks. No fevers, chills, sore throat, sinus pain/pressure. Other concern is for low back pain primarily located in the center with no radiation to the extremities described as sharp at time and is aggrevated upon standing and when seated for long periods of times. Has not attempted any treatments. Denies saddle anesthesia or changes to bowel or bladder habits. Continues to take Symtuza and Tivicay as prescribed with no adverse side effects or problems obtaining medication from the pharmacy. Condoms and STD testing offered. Healthcare maintenance due includes Shingrix.    Denies fevers, chills, night sweats, headaches, changes in vision, neck pain/stiffness, nausea, diarrhea, vomiting, lesions or rashes.  Allergies  Allergen Reactions   Soy Allergy     Upset stomach only- no other issues      Outpatient Medications Prior to Visit  Medication Sig Dispense Refill   gabapentin (NEURONTIN) 300 MG capsule Take 1 capsule (300 mg total) by mouth at bedtime. 30 capsule 11   Darunavir-Cobicistat-Emtricitabine-Tenofovir Alafenamide (SYMTUZA) 800-150-200-10 MG TABS Take 1 tablet by mouth daily with breakfast. Take with Tivicay. 30 tablet 5   dolutegravir (TIVICAY) 50 MG tablet Take 1 tablet (50 mg total) by mouth daily. Take with Symtuza. 30 tablet 5   megestrol (MEGACE) 400 MG/10ML suspension Take 10 mLs (400 mg total) by mouth daily. 240 mL 3   rosuvastatin (CRESTOR) 5 MG tablet Take 1 tablet (5 mg total) by mouth daily. 30 tablet 3   famotidine (PEPCID) 40 MG tablet TAKE 1 TABLET(40 MG) BY MOUTH DAILY (Patient not taking: Reported on 02/20/2023) 30 tablet 3   No facility-administered medications prior to visit.     Past Medical History:  Diagnosis Date   Arthritis    Diabetes    02-26-20 pt denies   Headache    HIV (human immunodeficiency virus infection)    TB (pulmonary tuberculosis)    dx in 2009- took years worth of medication for this     History reviewed. No pertinent surgical history.    Review of Systems  Constitutional:  Negative for appetite change, chills, fatigue, fever and unexpected weight change.  Eyes:  Negative for visual disturbance.  Respiratory:  Negative  for cough, chest tightness, shortness of breath and wheezing.   Cardiovascular:  Negative for chest pain and leg swelling.  Gastrointestinal:  Negative for abdominal pain, constipation, diarrhea, nausea and vomiting.  Genitourinary:  Negative for dysuria, flank pain, frequency, genital sores, hematuria and urgency.  Skin:  Negative for rash.  Allergic/Immunologic: Negative  for immunocompromised state.  Neurological:  Negative for dizziness and headaches.      Objective:    BP 132/86   Pulse 76   Wt 116 lb (52.6 kg)   BMI 20.55 kg/m  Nursing note and vital signs reviewed.  Physical Exam Constitutional:      General: He is not in acute distress.    Appearance: He is well-developed.  Eyes:     Conjunctiva/sclera: Conjunctivae normal.  Cardiovascular:     Rate and Rhythm: Normal rate and regular rhythm.     Heart sounds: Normal heart sounds. No murmur heard.    No friction rub. No gallop.  Pulmonary:     Effort: Pulmonary effort is normal. No respiratory distress.     Breath sounds: Normal breath sounds. No wheezing or rales.  Chest:     Chest wall: No tenderness.  Abdominal:     General: Bowel sounds are normal.     Palpations: Abdomen is soft.     Tenderness: There is no abdominal tenderness.  Musculoskeletal:     Cervical back: Neck supple.  Lymphadenopathy:     Cervical: No cervical adenopathy.  Skin:    General: Skin is warm and dry.     Findings: No rash.  Neurological:     Mental Status: He is alert and oriented to person, place, and time.  Psychiatric:        Behavior: Behavior normal.        Thought Content: Thought content normal.        Judgment: Judgment normal.         02/20/2023    9:21 AM 10/28/2022   11:00 AM 07/11/2022    9:20 AM 03/17/2022    1:51 PM 12/08/2020   10:56 AM  Depression screen PHQ 2/9  Decreased Interest 0 0 0 0 0  Down, Depressed, Hopeless 0 0 0 0 0  PHQ - 2 Score 0 0 0 0 0       Assessment & Plan:    Patient Active Problem List   Diagnosis Date Noted   Lumbar back pain 02/20/2023   Healthcare maintenance 07/11/2022   Cardiovascular disease 07/11/2022   Neck pain 03/17/2022   Fall at home, sequela 01/12/2021   Headache 12/25/2020   Acute metabolic encephalopathy 11/26/2020   Transaminitis 11/26/2020   Closed left arm fracture 12/23/2019   CKD (chronic kidney disease) stage 3, GFR 30-59  ml/min 12/23/2019   Insomnia 12/23/2019   Hyperglycemia 09/18/2018   Seasonal allergies 01/31/2018   Anorexia 07/15/2015   Otitis media 02/09/2015   Loss of weight 12/24/2012   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 Reviewed previous lab work and discussed plan of care. Check lab work. Continue current dose of Symtuza and Tivicay. Plan for follow up in 4 months or sooner if needed with lab work on the same day.       Relevant Medications   Darunavir-Cobicistat-Emtricitabine-Tenofovir Alafenamide (SYMTUZA) 800-150-200-10 MG TABS   dolutegravir (TIVICAY) 50 MG tablet   Other Relevant Orders   COMPLETE METABOLIC PANEL WITH GFR   HIV-1 RNA quant-no reflex-bld   T-helper cell (CD4)- ([facility] clinic only)   TOBACCO ABUSE    Continues to smoke tobacco and discussed importance of tobacco cessation and reducing risk of cardiovascular, respiratory and malignant disease in the future. In the pre-contemplation stage of change and not ready to quit.       Seasonal allergies    Eric Jefferson symptoms and exam are consistent with exacerbation of seasonal allergies. Recommend OTC antihistamine as first line treatment with other OTC medications as needed for symptom relief and supportive care. Follow up if symptoms worsen or do not improve.       Lumbar back pain    Eric Jefferson's back pain signs/symptoms appears consistent with muscular origin although cannot rule out underlying pathology. No  neurological deficit. Treat conservatively with ice/heat and home exercise therapy (provided in AVS). May need additional evaluation if symptoms do not improve.       Other Visit Diagnoses     Pharmacologic therapy       Relevant Orders   Lipid panel   Screening for STDs (sexually transmitted diseases)       Relevant Orders   RPR        I have discontinued Cort Ruff's megestrol and famotidine. I am also having him maintain his gabapentin, Symtuza, Tivicay, and rosuvastatin.   Meds ordered this encounter  Medications   Darunavir-Cobicistat-Emtricitabine-Tenofovir Alafenamide (SYMTUZA) 800-150-200-10 MG TABS    Sig: Take 1 tablet by mouth daily with breakfast. Take with Tivicay.    Dispense:  30 tablet    Refill:  5    Order Specific Question:   Supervising Provider    Answer:   Drue Second, CYNTHIA [[id number]]   dolutegravir (TIVICAY) 50 MG tablet    Sig: Take 1 tablet (50 mg total) by mouth daily. Take with Symtuza.    Dispense:  30 tablet    Refill:  5    Order Specific Question:   Supervising Provider    Answer:   Drue Second, CYNTHIA [[id number]]   rosuvastatin (CRESTOR) 5 MG tablet    Sig: Take 1 tablet (5 mg total) by mouth daily.    Dispense:  30 tablet    Refill:  5    Order Specific Question:   Supervising Provider    Answer:   Judyann Munson [[id number]]     Follow-up: Return in about 4 months (around 06/22/2023), or if symptoms worsen or fail to improve.   Marcos Eke, MSN, FNP-C Nurse Practitioner [facility] for Infectious Disease [facility] Main number: [phone]

## 2023-02-20 NOTE — Assessment & Plan Note (Signed)
Eric Jefferson continues to have well controlled virus with good adherence and tolerance to [place] and Tivicay. Reviewed previous lab work and discussed plan of care. Check lab work. Continue current dose of Symtuza and Tivicay. Plan for follow up in 4 months or sooner if needed with lab work on the same day.

## 2023-02-21 LAB — T-HELPER CELL (CD4) - (RCID CLINIC ONLY)
CD4 % Helper T Cell: 14 % — ABNORMAL LOW (ref 33–65)
CD4 T Cell Abs: 471 /uL (ref 400–1790)

## 2023-02-23 LAB — COMPLETE METABOLIC PANEL WITH GFR
AG Ratio: 1.3 (calc) (ref 1.0–2.5)
ALT: 10 U/L (ref 9–46)
AST: 18 U/L (ref 10–35)
Albumin: 4.8 g/dL (ref 3.6–5.1)
Alkaline phosphatase (APISO): 107 U/L (ref 35–144)
BUN: 23 mg/dL (ref 7–25)
CO2: 27 mmol/L (ref 20–32)
Calcium: 9.6 mg/dL (ref 8.6–10.3)
Chloride: 104 mmol/L (ref 98–110)
Creat: 1.22 mg/dL (ref 0.70–1.35)
Globulin: 3.7 g/dL (calc) (ref 1.9–3.7)
Glucose, Bld: 84 mg/dL (ref 65–99)
Potassium: 4.5 mmol/L (ref 3.5–5.3)
Sodium: 138 mmol/L (ref 135–146)
Total Bilirubin: 0.3 mg/dL (ref 0.2–1.2)
Total Protein: 8.5 g/dL — ABNORMAL HIGH (ref 6.1–8.1)
eGFR: 66 mL/min/{1.73_m2} (ref >=60)

## 2023-02-23 LAB — RPR: RPR Ser Ql: NONREACTIVE

## 2023-02-23 LAB — HIV-1 RNA QUANT-NO REFLEX-BLD
HIV 1 RNA Quant: NOT DETECTED Copies/mL
HIV-1 RNA Quant, Log: NOT DETECTED Log cps/mL

## 2023-02-23 LAB — LIPID PANEL
Cholesterol: 279 mg/dL — ABNORMAL HIGH (ref <200)
HDL: 52 mg/dL (ref >=40)
LDL Cholesterol (Calc): 186 mg/dL (calc) — ABNORMAL HIGH
Non-HDL Cholesterol (Calc): 227 mg/dL (calc) — ABNORMAL HIGH (ref <130)
Total CHOL/HDL Ratio: 5.4 (calc) — ABNORMAL HIGH (ref <5.0)
Triglycerides: 222 mg/dL — ABNORMAL HIGH (ref <150)

## 2023-05-30 ENCOUNTER — Other Ambulatory Visit: Payer: Self-pay

## 2023-05-30 DIAGNOSIS — B20 Human immunodeficiency virus [HIV] disease: Secondary | ICD-10-CM

## 2023-05-30 DIAGNOSIS — Z113 Encounter for screening for infections with a predominantly sexual mode of transmission: Secondary | ICD-10-CM

## 2023-06-01 ENCOUNTER — Ambulatory Visit: Payer: Self-pay

## 2023-06-01 ENCOUNTER — Other Ambulatory Visit: Payer: Self-pay

## 2023-06-01 DIAGNOSIS — Z113 Encounter for screening for infections with a predominantly sexual mode of transmission: Secondary | ICD-10-CM

## 2023-06-01 DIAGNOSIS — B20 Human immunodeficiency virus [HIV] disease: Secondary | ICD-10-CM

## 2023-06-01 LAB — CBC WITH DIFFERENTIAL/PLATELET
Absolute Monocytes: 704 cells/uL (ref 200–950)
Basophils Absolute: 77 cells/uL (ref 0–200)
Basophils Relative: 0.7 %
Eosinophils Absolute: 242 cells/uL (ref 15–500)
Eosinophils Relative: 2.2 %
HCT: 45.6 % (ref 38.5–50.0)
Hemoglobin: 14.9 g/dL (ref 13.2–17.1)
Lymphs Abs: 3773 cells/uL (ref 850–3900)
MCH: 28 pg (ref 27.0–33.0)
MCHC: 32.7 g/dL (ref 32.0–36.0)
MCV: 85.6 fL (ref 80.0–100.0)
MPV: 10.2 fL (ref 7.5–12.5)
Monocytes Relative: 6.4 %
Neutro Abs: 6204 cells/uL (ref 1500–7800)
Neutrophils Relative %: 56.4 %
Platelets: 297 10*3/uL (ref 140–400)
RBC: 5.33 10*6/uL (ref 4.20–5.80)
RDW: 13.7 % (ref 11.0–15.0)
Total Lymphocyte: 34.3 %
WBC: 11 10*3/uL — ABNORMAL HIGH (ref 3.8–10.8)

## 2023-06-14 ENCOUNTER — Encounter: Payer: Self-pay | Admitting: Family

## 2023-06-15 ENCOUNTER — Encounter: Payer: Self-pay | Admitting: Family

## 2023-06-15 ENCOUNTER — Other Ambulatory Visit: Payer: Self-pay

## 2023-06-15 ENCOUNTER — Ambulatory Visit (INDEPENDENT_AMBULATORY_CARE_PROVIDER_SITE_OTHER): Payer: Self-pay | Admitting: Family

## 2023-06-15 VITALS — BP 123/80 | HR 71 | Temp 97.8°F | Wt 114.0 lb

## 2023-06-15 DIAGNOSIS — B20 Human immunodeficiency virus [HIV] disease: Secondary | ICD-10-CM

## 2023-06-15 DIAGNOSIS — Z0000 Encounter for general adult medical examination without abnormal findings: Secondary | ICD-10-CM

## 2023-06-15 DIAGNOSIS — I2510 Atherosclerotic heart disease of native coronary artery without angina pectoris: Secondary | ICD-10-CM

## 2023-06-15 DIAGNOSIS — Z79899 Other long term (current) drug therapy: Secondary | ICD-10-CM

## 2023-06-15 DIAGNOSIS — F17210 Nicotine dependence, cigarettes, uncomplicated: Secondary | ICD-10-CM

## 2023-06-15 DIAGNOSIS — F17200 Nicotine dependence, unspecified, uncomplicated: Secondary | ICD-10-CM

## 2023-06-15 DIAGNOSIS — M542 Cervicalgia: Secondary | ICD-10-CM

## 2023-06-15 MED ORDER — SYMTUZA 800-150-200-10 MG PO TABS
1.0000 | ORAL_TABLET | Freq: Every day | ORAL | 5 refills | Status: DC
Start: 2023-06-15 — End: 2023-11-28

## 2023-06-15 MED ORDER — TIVICAY 50 MG PO TABS
50.0000 mg | ORAL_TABLET | Freq: Every day | ORAL | 5 refills | Status: DC
Start: 2023-06-15 — End: 2023-11-28

## 2023-06-15 MED ORDER — ROSUVASTATIN CALCIUM 5 MG PO TABS: 5.0000 mg | ORAL_TABLET | Freq: Every day | ORAL | 5 refills | Status: DC

## 2023-06-15 NOTE — Assessment & Plan Note (Addendum)
Discussed importance of safe sexual practice and condom use. Condoms and STD testing offered.  Colon cancer screening up to date.  Declines Shingrix. Routine dental care up to date.

## 2023-06-15 NOTE — Assessment & Plan Note (Signed)
Continues to have neck and back pain and maintained on gabapentin via Neurology.  Requested refill, however has appointment scheduled for Monday, 8/19.

## 2023-06-15 NOTE — Patient Instructions (Addendum)
Nice to see you.  Continue to take your medication daily as prescribed.  Refills have been sent to the pharmacy.  Plan for follow up in 4 months or sooner if needed with lab work 1-2 weeks prior to appointment.   Have a great day and stay safe!  

## 2023-06-15 NOTE — Assessment & Plan Note (Signed)
Eric Jefferson continues to have well controlled virus with good adherence and tolerance to [place] and Tivicay. Reviewed lab work and discussed plan of care and U equals U. Financial assistance renewed. Continue current dose of Symtuza and Tivicay. Plan for follow up in 4 months or sooner if needed with lab work 1-2 weeks prior to appointment.

## 2023-06-15 NOTE — Progress Notes (Signed)
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   Gabapentin 300mg  at bedtime Follow up one year or as needed   Subjective:  Eric Jefferson is a 62 year old right-handed [place] male with HIV/AIDS, diabetes, and history of TB who follows up for headache  He is accompanied by an interpreter.   UPDATE: Headaches are mild and occur usually once to twice a week, sometimes 3 times a week.  Usually lasts 30-60 minutes.  Sometimes takes Tylenol but not always.     Current medication:  gabapentin 300mg  at bedtime, acetaminophen as needed.     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  Presented with headache, weakness and questionable altered mental status.  Reported fever at home.  CT brain showed maxillary and sphenoid sinusitis.  MRI of brain with and without contrast were negative for acute intracranial abnormality.  He was started on acyclovir empirically.  Underwent LP - CSF cell count was 14 (lymphocyte predominance) with mildly elevated protein of 62, glucose 63, negative gram stain/culture. CSF Cryptococcal antigen, fungal culture, HSV PCR and VDRL were negative.  UDS was negative.  TSH, B1, B12 and TSH were normal.  EEG was normal.  MRI of cervical and thoracic spine with and without contrast showed some degenerative changes with foraminal impingement on right at C4-5 and left at C5-6 but no cord abnormalities.     Since hospital discharge, he reports daily headaches.  It is a dull pain starting in the right paraspinal region radiating up the head and into the right eye with associated blurred vision and lacrimation in the right eye.  No nausea, vomiting, photophobia, phonophobia, ptosis, conjunctival injection, numbness or weakness.  They occur twice a day, usually in  the morning and then in the afternoon.  Treats with Tylenol or Advil, but not daily.  No prior history of headache.  PAST MEDICAL HISTORY: Past Medical History:  Diagnosis Date   Arthritis    Diabetes (HCC)    02-26-20 pt denies   Headache    HIV (human immunodeficiency virus infection) (HCC)    TB (pulmonary tuberculosis)    dx in 2009- took years worth of medication for this    MEDICATIONS: Current Outpatient Medications on File Prior to Visit  Medication Sig Dispense Refill   Darunavir-Cobicistat-Emtricitabine-Tenofovir Alafenamide (SYMTUZA) 800-150-200-10 MG TABS Take 1 tablet by mouth daily with breakfast. Take with Tivicay. 30 tablet 5   dolutegravir (TIVICAY) 50 MG tablet Take 1 tablet (50 mg total) by mouth daily. Take with Symtuza. 30 tablet 5   gabapentin (NEURONTIN) 300 MG capsule Take 1 capsule (300 mg total) by mouth at bedtime. 30 capsule 11   rosuvastatin (CRESTOR) 5 MG tablet Take 1 tablet (5 mg total) by mouth daily. 30 tablet 5   [DISCONTINUED] atorvastatin (LIPITOR) 10 MG tablet TAKE 1 TABLET(10 MG) BY MOUTH DAILY (Patient not taking: Reported on 03/04/2020) 30 tablet 0   [DISCONTINUED] traZODone (DESYREL) 50 MG tablet Take 0.5 tablets (25 mg total) by mouth at bedtime as needed for sleep. (Patient not taking: Reported on 03/04/2020) 20 tablet 1   No current facility-administered medications on file prior to visit.    ALLERGIES: Allergies  Allergen Reactions   Soy Allergy     Upset stomach only- no other issues    FAMILY  HISTORY: Family History  Problem Relation Age of Onset   Healthy Mother    Healthy Father    Colon cancer Neg Hx    Esophageal cancer Neg Hx    Rectal cancer Neg Hx    Stomach cancer Neg Hx       Objective:  Blood pressure (!) 144/84, pulse 75, height 5\' 6"  (1.676 m), weight 116 lb (52.6 kg), SpO2 93%. General: No acute distress.  Patient appears well-groomed.   Head:  Normocephalic/atraumatic Eyes:  Fundi examined but not  visualized Neck: supple, right sided paraspinal tenderness, full range of motion Heart:  Regular rate and rhythm Neurological Exam: alert and oriented.  Speech fluent and not dysarthric, language intact.  CN II-XII intact. Bulk and tone normal, muscle strength 5/5 throughout.  Cannot flex elbow due to remote elbow injury.  Sensation to light touch intact.  Deep tendon reflexes 2+ throughout.  Finger to nose testing intact.  Gait normal, Romberg negative.   [name], DO

## 2023-06-15 NOTE — Assessment & Plan Note (Signed)
Eric Jefferson continues to take rosuvastatin as prescribed with no adverse side effects or myalgias. Will check lipid profile with next lab work. Continue current dose of rosuvastain.

## 2023-06-15 NOTE — Progress Notes (Signed)
Brief Narrative   Patient ID: Eric Jefferson, male    DOB: 1961/08/17, 62 y.o.   MRN: [id number]  Mr. Randlett is a 62 y/o [place] male with HIV/AIDS diagnosed on 01/08/2008 with risk factor of heterosexual contact in the setting of TB infection with treatment completed in November 2009. Initial viral load was 52,400 and CD4 count 510. Genosure with Y181C, L210W, T215D, and A71V conferring resistance to zidovudine, didanosine, stavudine, delaviridine, efavirenz, nevirapine, and etravirine. Entered care at [facility] Stage 1. PRevious ART history with Stribild, Genvoya, Prezista/ritonavir, Prezcobix, Descovy, and currently with Symtuza and Tivicay.    Subjective:    Chief Complaint  Patient presents with   Follow-up   HIV Positive/AIDS    HPI:  Aarik Bellofatto is a 62 y.o. male with HIV disease last seen on 02/20/23 with well controlled virus and good adherence and tolerance to [place] and Tivicay. Viral load was undetectable with CD4 count 471. Kidney function, liver function and electrolytes within normal ranged. Most recent lab work completed on 06/01/23 with undetectable viral load and CD4 count 413. Kidney function, liver function and electrolytes within normal ranges. Mr. Matovich primary preferred language is Montagn[place] and a medical interpreter is present to aid in communication. Here today for routine follow up.  Mr. Sipe has been doing well since his last office visit and continues to have neck and back pain. Has follow up with Neurology on Monday 8/19. Taking Symtuza and Tivicay as prescribed with no adverse side effects or problems obtaining medication from the pharmacy. Financial assistance renewed. No other concerns/complaints. Condoms and STD testing offered. Reviewed vaccines and due for Shingrix. Colon cancer screening up to date.   Denies fevers, chills, night sweats, headaches, changes in vision, neck pain/stiffness, nausea, diarrhea, vomiting, lesions or rashes.    Allergies  Allergen Reactions    Soy Allergy     Upset stomach only- no other issues      Outpatient Medications Prior to Visit  Medication Sig Dispense Refill   gabapentin (NEURONTIN) 300 MG capsule Take 1 capsule (300 mg total) by mouth at bedtime. 30 capsule 11   Pyridoxine HCl (VITAMIN B-6 PO) Take by mouth.     Darunavir-Cobicistat-Emtricitabine-Tenofovir Alafenamide (SYMTUZA) 800-150-200-10 MG TABS Take 1 tablet by mouth daily with breakfast. Take with Tivicay. 30 tablet 5   dolutegravir (TIVICAY) 50 MG tablet Take 1 tablet (50 mg total) by mouth daily. Take with Symtuza. 30 tablet 5   rosuvastatin (CRESTOR) 5 MG tablet Take 1 tablet (5 mg total) by mouth daily. 30 tablet 5   No facility-administered medications prior to visit.     Past Medical History:  Diagnosis Date   Arthritis    Diabetes (HCC)    02-26-20 pt denies   Headache    HIV (human immunodeficiency virus infection) (HCC)    TB (pulmonary tuberculosis)    dx in 2009- took years worth of medication for this     No past surgical history on file.    Review of Systems  Constitutional:  Negative for appetite change, chills, fatigue, fever and unexpected weight change.  Eyes:  Negative for visual disturbance.  Respiratory:  Negative for cough, chest tightness, shortness of breath and wheezing.   Cardiovascular:  Negative for chest pain and leg swelling.  Gastrointestinal:  Negative for abdominal pain, constipation, diarrhea, nausea and vomiting.  Genitourinary:  Negative for dysuria, flank pain, frequency, genital sores, hematuria and urgency.  Skin:  Negative for rash.  Allergic/Immunologic: Negative for immunocompromised state.  Neurological:  Negative for dizziness and headaches.      Objective:    BP 123/80   Pulse 71   Temp 97.8 F (36.6 C) (Temporal)   Wt 114 lb (51.7 kg)   SpO2 99%   BMI 20.19 kg/m  Nursing note and vital signs reviewed.  Physical Exam Constitutional:      General: He is not in acute distress.     Appearance: He is well-developed.  Eyes:     Conjunctiva/sclera: Conjunctivae normal.  Cardiovascular:     Rate and Rhythm: Normal rate and regular rhythm.     Heart sounds: Normal heart sounds. No murmur heard.    No friction rub. No gallop.  Pulmonary:     Effort: Pulmonary effort is normal. No respiratory distress.     Breath sounds: Normal breath sounds. No wheezing or rales.  Chest:     Chest wall: No tenderness.  Abdominal:     General: Bowel sounds are normal.     Palpations: Abdomen is soft.     Tenderness: There is no abdominal tenderness.  Musculoskeletal:     Cervical back: Neck supple.  Lymphadenopathy:     Cervical: No cervical adenopathy.  Skin:    General: Skin is warm and dry.     Findings: No rash.  Neurological:     Mental Status: He is alert and oriented to person, place, and time.  Psychiatric:        Behavior: Behavior normal.        Thought Content: Thought content normal.        Judgment: Judgment normal.         02/20/2023    9:21 AM 10/28/2022   11:00 AM 07/11/2022    9:20 AM 03/17/2022    1:51 PM 12/08/2020   10:56 AM  Depression screen PHQ 2/9  Decreased Interest 0 0 0 0 0  Down, Depressed, Hopeless 0 0 0 0 0  PHQ - 2 Score 0 0 0 0 0       Assessment & Plan:    Patient Active Problem List   Diagnosis Date Noted   Lumbar back pain 02/20/2023   Healthcare maintenance 07/11/2022   Cardiovascular disease 07/11/2022   Neck pain 03/17/2022   Fall at home, sequela 01/12/2021   Headache 12/25/2020   Acute metabolic encephalopathy 11/26/2020   Transaminitis 11/26/2020   Closed left arm fracture 12/23/2019   CKD (chronic kidney disease) stage 3, GFR 30-59 ml/min (HCC) 12/23/2019   Insomnia 12/23/2019   Hyperglycemia 09/18/2018   Seasonal allergies 01/31/2018   Anorexia 07/15/2015   Otitis media 02/09/2015   Loss of weight 12/24/2012   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 Farnworth continues to take rosuvastatin as prescribed with no adverse side effects or myalgias. Will check lipid profile with next lab work. Continue current dose of rosuvastain.       Relevant Medications   rosuvastatin (CRESTOR) 5 MG tablet   Other Relevant Orders   Lipid panel     Other   AIDS (acquired immune deficiency syndrome) (HCC) - Primary    Mr. Schoenherr continues to have well controlled virus with good adherence and  tolerance to [place] and Tivicay. Reviewed lab work and discussed plan of care and U equals U. Financial assistance renewed. Continue current dose of Symtuza and Tivicay. Plan for follow up in 4 months or sooner if needed with lab work 1-2 weeks prior to appointment.       Relevant Medications   Darunavir-Cobicistat-Emtricitabine-Tenofovir Alafenamide (SYMTUZA) 800-150-200-10 MG TABS   dolutegravir (TIVICAY) 50 MG tablet   Other Relevant Orders   COMPLETE METABOLIC PANEL WITH GFR   HIV-1 RNA quant-no reflex-bld   T-helper cell (CD4)- ([facility] clinic only)   TOBACCO ABUSE    Mr. Beauchemin continues to smoke tobacco which increases his risk for cardiovascular disease and discussed recommendations for tobacco cessation to reduce risk of cardiovascular, respiratory and malignant disease in the future. Not ready to quit at this time.       Neck pain    Continues to have neck and back pain and maintained on gabapentin via Neurology.  Requested refill, however has appointment scheduled for Monday, 8/19.       Healthcare maintenance    Discussed importance of safe sexual practice and condom use. Condoms and STD testing offered.  Colon cancer screening up to date.  Declines Shingrix. Routine dental care up to date.       Other Visit  Diagnoses     Pharmacologic therapy       Relevant Orders   Lipid panel        I am having Merrick Castanon maintain his gabapentin, Pyridoxine HCl (VITAMIN B-6 PO), Symtuza, Tivicay, and rosuvastatin.   Meds ordered this encounter  Medications   Darunavir-Cobicistat-Emtricitabine-Tenofovir Alafenamide (SYMTUZA) 800-150-200-10 MG TABS    Sig: Take 1 tablet by mouth daily with breakfast. Take with Tivicay.    Dispense:  30 tablet    Refill:  5    Order Specific Question:   Supervising Provider    Answer:   Drue Second, CYNTHIA [[id number]]   dolutegravir (TIVICAY) 50 MG tablet    Sig: Take 1 tablet (50 mg total) by mouth daily. Take with Symtuza.    Dispense:  30 tablet    Refill:  5    Order Specific Question:   Supervising Provider    Answer:   Drue Second, CYNTHIA [[id number]]   rosuvastatin (CRESTOR) 5 MG tablet    Sig: Take 1 tablet (5 mg total) by mouth daily.    Dispense:  30 tablet    Refill:  5    Order Specific Question:   Supervising Provider    Answer:   Judyann Munson [[id number]]     Follow-up: Return in about 4 months (around 10/15/2023), or if symptoms worsen or fail to improve.   Marcos Eke, MSN, FNP-C Nurse Practitioner [facility] Main number: [phone]

## 2023-06-15 NOTE — Assessment & Plan Note (Signed)
Mr. Szafran continues to smoke tobacco which increases his risk for cardiovascular disease and discussed recommendations for tobacco cessation to reduce risk of cardiovascular, respiratory and malignant disease in the future. Not ready to quit at this time.

## 2023-06-19 ENCOUNTER — Encounter: Payer: Self-pay | Admitting: Neurology

## 2023-06-19 ENCOUNTER — Ambulatory Visit (INDEPENDENT_AMBULATORY_CARE_PROVIDER_SITE_OTHER): Payer: Self-pay | Admitting: Neurology

## 2023-06-19 VITALS — BP 144/84 | HR 75 | Ht 66.0 in | Wt 116.0 lb

## 2023-06-19 DIAGNOSIS — G4486 Cervicogenic headache: Secondary | ICD-10-CM

## 2023-06-19 MED ORDER — GABAPENTIN 300 MG PO CAPS: 300.0000 mg | ORAL_CAPSULE | Freq: Every day | ORAL | 11 refills | Status: AC

## 2023-06-19 NOTE — Patient Instructions (Addendum)
Help finding a primary care provider within [place]. If you do not have access to a computer or the Internet you can call [phone] and  they will help you scheduled with a provider.  Or you can go to: [email or web address]     Continue gabapentin 300mg  at bedtime

## 2023-09-25 ENCOUNTER — Ambulatory Visit (HOSPITAL_COMMUNITY)
Admission: EM | Admit: 2023-09-25 | Discharge: 2023-09-25 | Disposition: A | Discharge status: Home / Self Care | Payer: Medicaid Other | Attending: Family Medicine | Admitting: Family Medicine

## 2023-09-25 ENCOUNTER — Ambulatory Visit (INDEPENDENT_AMBULATORY_CARE_PROVIDER_SITE_OTHER): Payer: Self-pay

## 2023-09-25 ENCOUNTER — Other Ambulatory Visit: Payer: Self-pay

## 2023-09-25 ENCOUNTER — Encounter (HOSPITAL_COMMUNITY): Payer: Self-pay | Admitting: *Deleted

## 2023-09-25 DIAGNOSIS — J019 Acute sinusitis, unspecified: Secondary | ICD-10-CM

## 2023-09-25 DIAGNOSIS — J189 Pneumonia, unspecified organism: Secondary | ICD-10-CM | POA: Insufficient documentation

## 2023-09-25 LAB — CBC WITH DIFFERENTIAL/PLATELET
Abs Immature Granulocytes: 0.06 10*3/uL (ref 0.00–0.07)
Basophils Absolute: 0.1 10*3/uL (ref 0.0–0.1)
Basophils Relative: 1 %
Eosinophils Absolute: 0.7 10*3/uL — ABNORMAL HIGH (ref 0.0–0.5)
Eosinophils Relative: 5 %
HCT: 38.1 % — ABNORMAL LOW (ref 39.0–52.0)
Hemoglobin: 12.7 g/dL — ABNORMAL LOW (ref 13.0–17.0)
Immature Granulocytes: 1 %
Lymphocytes Relative: 26 %
Lymphs Abs: 3.4 10*3/uL (ref 0.7–4.0)
MCH: 29.7 pg (ref 26.0–34.0)
MCHC: 33.3 g/dL (ref 30.0–36.0)
MCV: 89 fL (ref 80.0–100.0)
Monocytes Absolute: 1.2 10*3/uL — ABNORMAL HIGH (ref 0.1–1.0)
Monocytes Relative: 9 %
Neutro Abs: 7.6 10*3/uL (ref 1.7–7.7)
Neutrophils Relative %: 58 %
Platelets: 321 10*3/uL (ref 150–400)
RBC: 4.28 MIL/uL (ref 4.22–5.81)
RDW: 13.5 % (ref 11.5–15.5)
WBC: 13 10*3/uL — ABNORMAL HIGH (ref 4.0–10.5)
nRBC: 0 % (ref 0.0–0.2)

## 2023-09-25 LAB — COMPREHENSIVE METABOLIC PANEL
ALT: 17 U/L (ref 0–44)
AST: 25 U/L (ref 15–41)
Albumin: 3.6 g/dL (ref 3.5–5.0)
Alkaline Phosphatase: 97 U/L (ref 38–126)
Anion gap: 8 (ref 5–15)
BUN: 13 mg/dL (ref 8–23)
CO2: 25 mmol/L (ref 22–32)
Calcium: 9.3 mg/dL (ref 8.9–10.3)
Chloride: 103 mmol/L (ref 98–111)
Creatinine, Ser: 1.12 mg/dL (ref 0.61–1.24)
GFR, Estimated: >60 mL/min (ref >60)
Glucose, Bld: 91 mg/dL (ref 70–99)
Potassium: 5 mmol/L (ref 3.5–5.1)
Sodium: 136 mmol/L (ref 135–145)
Total Bilirubin: 0.6 mg/dL (ref <1.2)
Total Protein: 7.9 g/dL (ref 6.5–8.1)

## 2023-09-25 MED ORDER — BENZONATATE 100 MG PO CAPS: 100.0000 mg | ORAL_CAPSULE | Freq: Three times a day (TID) | ORAL | 0 refills | Status: DC | PRN

## 2023-09-25 MED ORDER — LEVOFLOXACIN 500 MG PO TABS: 500.0000 mg | ORAL_TABLET | Freq: Every day | ORAL | 0 refills | Status: AC

## 2023-09-25 NOTE — ED Notes (Signed)
PLease call emergency # for lab results

## 2023-09-25 NOTE — ED Triage Notes (Signed)
Pt has had a HA ,vomiting ,chills for one week. Pt also had a nose bleed. Patient has also had a cough.

## 2023-09-25 NOTE — Discharge Instructions (Signed)
The chest x-ray shows a possible pneumonia on the right lower lung by my review.  Levaquin 500 mg--take 1 tablet daily for 5 days  Take benzonatate 100 mg, 1 tab every 8 hours as needed for cough.  We have drawn blood to check your blood counts and your electrolytes and kidney and liver function numbers.  Our staff will call you if there is anything significantly abnormal.  Please follow-up with your primary care and your infectious disease specialist.

## 2023-09-25 NOTE — ED Provider Notes (Signed)
[facility]    CSN: [id number] Arrival date & time: 09/25/23  1211      History   Chief Complaint Chief Complaint  Patient presents with   Headache   Chills   Emesis    HPI Eric Jefferson is a 62 y.o. male.    Headache Associated symptoms: vomiting   Emesis Associated symptoms: headaches   Here for cough and congestion and headache.  Symptoms began about 3 weeks ago.  He has been throwing up about once or twice a day, for the most part after he is coughed really hard.  No fever but he has had some chills in the last few days.    He does take his meds for his HIV faithfully.  Last viral load was undetectable.    Past Medical History:  Diagnosis Date   Arthritis    Diabetes (HCC)    02-26-20 pt denies   Headache    HIV (human immunodeficiency virus infection) (HCC)    TB (pulmonary tuberculosis)    dx in 2009- took years worth of medication for this    Patient Active Problem List   Diagnosis Date Noted   Lumbar back pain 02/20/2023   Healthcare maintenance 07/11/2022   Cardiovascular disease 07/11/2022   Neck pain 03/17/2022   Fall at home, sequela 01/12/2021   Headache 12/25/2020   Acute metabolic encephalopathy 11/26/2020   Transaminitis 11/26/2020   Closed left arm fracture 12/23/2019   CKD (chronic kidney disease) stage 3, GFR 30-59 ml/min (HCC) 12/23/2019   Insomnia 12/23/2019   Hyperglycemia 09/18/2018   Seasonal allergies 01/31/2018   Anorexia 07/15/2015   Otitis media 02/09/2015   Loss of weight 12/24/2012   Pneumonia due to Histoplasma capsulatum fungus (HCC) 12/08/2012   Dental caries 09/09/2009   AIDS (acquired immune deficiency syndrome) (HCC) 05/06/2009   Pulmonary tuberculosis 05/09/2008   TOBACCO ABUSE 02/04/2008   GERD 02/04/2008    History reviewed. No pertinent surgical history.     Home Medications    Prior to Admission medications   Medication Sig Start Date End Date Taking? Authorizing Provider  benzonatate  (TESSALON) 100 MG capsule Take 1 capsule (100 mg total) by mouth 3 (three) times daily as needed for cough. 09/25/23  Yes Zenia Resides, MD  Darunavir-Cobicistat-Emtricitabine-Tenofovir Alafenamide (SYMTUZA) 800-150-200-10 MG TABS Take 1 tablet by mouth daily with breakfast. Take with Tivicay. 06/15/23  Yes Veryl Speak, FNP  dolutegravir (TIVICAY) 50 MG tablet Take 1 tablet (50 mg total) by mouth daily. Take with Symtuza. 06/15/23  Yes Veryl Speak, FNP  gabapentin (NEURONTIN) 300 MG capsule Take 1 capsule (300 mg total) by mouth at bedtime. 06/19/23  Yes Jaffe, Adam R, DO  levofloxacin (LEVAQUIN) 500 MG tablet Take 1 tablet (500 mg total) by mouth daily for 7 days. 09/25/23 10/02/23 Yes Zenia Resides, MD  Pyridoxine HCl (VITAMIN B-6 PO) Take by mouth.   Yes [provider]  rosuvastatin (CRESTOR) 5 MG tablet Take 1 tablet (5 mg total) by mouth daily. 06/15/23  Yes Veryl Speak, FNP  atorvastatin (LIPITOR) 10 MG tablet TAKE 1 TABLET(10 MG) BY MOUTH DAILY Patient not taking: Reported on 03/04/2020 09/03/18 07/07/20  Ginnie Smart, MD  traZODone (DESYREL) 50 MG tablet Take 0.5 tablets (25 mg total) by mouth at bedtime as needed for sleep. Patient not taking: Reported on 03/04/2020 12/23/19 07/07/20  Ginnie Smart, MD    Family History Family History  Problem Relation Age of Onset  Healthy Mother    Healthy Father    Colon cancer Neg Hx    Esophageal cancer Neg Hx    Rectal cancer Neg Hx    Stomach cancer Neg Hx     Social History Social History   Tobacco Use   Smoking status: Every Day    Current packs/day: 0.25    Average packs/day: 0.3 packs/day for 30.0 years (7.5 ttl pk-yrs)    Types: Cigarettes   Smokeless tobacco: Never   Tobacco comments:    3-4 cigarettes a day  Vaping Use   Vaping status: Never Used  Substance Use Topics   Alcohol use: No    Alcohol/week: 0.0 standard drinks of alcohol    Comment: former   Drug use: No     Allergies    Soy allergy   Review of Systems Review of Systems  Gastrointestinal:  Positive for vomiting.  Neurological:  Positive for headaches.     Physical Exam Triage Vital Signs ED Triage Vitals  Encounter Vitals Group     BP 09/25/23 1417 118/80     Systolic BP Percentile --      Diastolic BP Percentile --      Pulse Rate 09/25/23 1417 72     Resp 09/25/23 1417 20     Temp 09/25/23 1417 98.5 F (36.9 C)     Temp src --      SpO2 09/25/23 1417 98 %     Weight --      Height --      Head Circumference --      Peak Flow --      Pain Score 09/25/23 1415 0     Pain Loc --      Pain Education --      Exclude from Growth Chart --    No data found.  Updated Vital Signs BP 118/80   Pulse 72   Temp 98.5 F (36.9 C)   Resp 20   SpO2 98%   Visual Acuity Right Eye Distance:   Left Eye Distance:   Bilateral Distance:    Right Eye Near:   Left Eye Near:    Bilateral Near:     Physical Exam Vitals reviewed.  Constitutional:      General: He is not in acute distress.    Appearance: He is not ill-appearing, toxic-appearing or diaphoretic.  HENT:     Right Ear: Tympanic membrane and ear canal normal.     Left Ear: Tympanic membrane and ear canal normal.     Nose: Congestion present.     Mouth/Throat:     Mouth: Mucous membranes are moist.     Comments: There is clear mucus draining in the oropharynx Eyes:     Extraocular Movements: Extraocular movements intact.     Conjunctiva/sclera: Conjunctivae normal.     Pupils: Pupils are equal, round, and reactive to light.  Cardiovascular:     Rate and Rhythm: Normal rate and regular rhythm.     Heart sounds: No murmur heard. Pulmonary:     Effort: Pulmonary effort is normal. No respiratory distress.     Breath sounds: Normal breath sounds. No stridor. No wheezing, rhonchi or rales.  Musculoskeletal:     Cervical back: Neck supple.  Lymphadenopathy:     Cervical: No cervical adenopathy.  Skin:    Capillary Refill:  Capillary refill takes less than 2 seconds.     Coloration: Skin is not jaundiced or pale.  Neurological:  General: No focal deficit present.     Mental Status: He is alert and oriented to person, place, and time.  Psychiatric:        Behavior: Behavior normal.      UC Treatments / Results  Labs (all labs ordered are listed, but only abnormal results are displayed) Labs Reviewed  COMPREHENSIVE METABOLIC PANEL  CBC WITH DIFFERENTIAL/PLATELET    EKG   Radiology No results found.  Procedures Procedures (including critical care time)  Medications Ordered in UC Medications - No data to display  Initial Impression / Assessment and Plan / UC Course  I have reviewed the triage vital signs and the nursing notes.  Pertinent labs & imaging results that were available during my care of the patient were reviewed by me and considered in my medical decision making (see chart for details).     By my review there is a possible infiltrate in his right lower lobe.  Levaquin is sent in to treat with Jerilynn Som for cough.  CBC and CMP are drawn since he is having this illness about 3 weeks.  I have asked him to follow-up with his primary care and his infectious disease specialist Final Clinical Impressions(s) / UC Diagnoses   Final diagnoses:  Community acquired pneumonia of right lower lobe of lung  Acute sinusitis, recurrence not specified, unspecified location     Discharge Instructions      The chest x-ray shows a possible pneumonia on the right lower lung by my review.  Levaquin 500 mg--take 1 tablet daily for 5 days  Take benzonatate 100 mg, 1 tab every 8 hours as needed for cough.  We have drawn blood to check your blood counts and your electrolytes and kidney and liver function numbers.  Our staff will call you if there is anything significantly abnormal.  Please follow-up with your primary care and your infectious disease specialist.       ED  Prescriptions     Medication Sig Dispense Auth. Provider   levofloxacin (LEVAQUIN) 500 MG tablet Take 1 tablet (500 mg total) by mouth daily for 7 days. 7 tablet Dashawna Delbridge, Janace Aris, MD   benzonatate (TESSALON) 100 MG capsule Take 1 capsule (100 mg total) by mouth 3 (three) times daily as needed for cough. 21 capsule Zenia Resides, MD      PDMP not reviewed this encounter.   Zenia Resides, MD 09/25/23 1515

## 2023-10-16 ENCOUNTER — Other Ambulatory Visit: Payer: Self-pay

## 2023-10-16 DIAGNOSIS — I2510 Atherosclerotic heart disease of native coronary artery without angina pectoris: Secondary | ICD-10-CM

## 2023-10-16 DIAGNOSIS — Z79899 Other long term (current) drug therapy: Secondary | ICD-10-CM

## 2023-10-16 DIAGNOSIS — B20 Human immunodeficiency virus [HIV] disease: Secondary | ICD-10-CM

## 2023-10-17 LAB — T-HELPER CELL (CD4) - (RCID CLINIC ONLY)
CD4 % Helper T Cell: 16 % — ABNORMAL LOW (ref 33–65)
CD4 T Cell Abs: 528 /uL (ref 400–1790)

## 2023-10-18 LAB — HIV-1 RNA QUANT-NO REFLEX-BLD
HIV 1 RNA Quant: 26 {copies}/mL — ABNORMAL HIGH
HIV-1 RNA Quant, Log: 1.42 {Log} — ABNORMAL HIGH

## 2023-10-18 LAB — LIPID PANEL
Cholesterol: 252 mg/dL — ABNORMAL HIGH (ref <200)
HDL: 50 mg/dL (ref >=40)
LDL Cholesterol (Calc): 164 mg/dL — ABNORMAL HIGH
Non-HDL Cholesterol (Calc): 202 mg/dL — ABNORMAL HIGH (ref <130)
Total CHOL/HDL Ratio: 5 (calc) — ABNORMAL HIGH (ref <5.0)
Triglycerides: 214 mg/dL — ABNORMAL HIGH (ref <150)

## 2023-10-18 LAB — COMPLETE METABOLIC PANEL WITH GFR
AG Ratio: 1.3 (calc) (ref 1.0–2.5)
ALT: 11 U/L (ref 9–46)
AST: 17 U/L (ref 10–35)
Albumin: 4.4 g/dL (ref 3.6–5.1)
Alkaline phosphatase (APISO): 88 U/L (ref 35–144)
BUN: 20 mg/dL (ref 7–25)
CO2: 26 mmol/L (ref 20–32)
Calcium: 9.2 mg/dL (ref 8.6–10.3)
Chloride: 103 mmol/L (ref 98–110)
Creat: 1.29 mg/dL (ref 0.70–1.35)
Globulin: 3.4 g/dL (ref 1.9–3.7)
Glucose, Bld: 166 mg/dL — ABNORMAL HIGH (ref 65–99)
Potassium: 4.2 mmol/L (ref 3.5–5.3)
Sodium: 138 mmol/L (ref 135–146)
Total Bilirubin: 0.4 mg/dL (ref 0.2–1.2)
Total Protein: 7.8 g/dL (ref 6.1–8.1)
eGFR: 62 mL/min/{1.73_m2} (ref >=60)

## 2023-10-30 ENCOUNTER — Ambulatory Visit: Payer: Medicaid Other | Admitting: Family

## 2023-11-28 ENCOUNTER — Other Ambulatory Visit: Payer: Self-pay

## 2023-11-28 ENCOUNTER — Ambulatory Visit (INDEPENDENT_AMBULATORY_CARE_PROVIDER_SITE_OTHER): Payer: Medicare Other | Admitting: Family

## 2023-11-28 ENCOUNTER — Other Ambulatory Visit (HOSPITAL_COMMUNITY): Payer: Self-pay

## 2023-11-28 ENCOUNTER — Encounter: Payer: Self-pay | Admitting: Family

## 2023-11-28 VITALS — BP 112/77 | HR 81 | Temp 95.5°F | Wt 120.0 lb

## 2023-11-28 DIAGNOSIS — Z0000 Encounter for general adult medical examination without abnormal findings: Secondary | ICD-10-CM

## 2023-11-28 DIAGNOSIS — F17200 Nicotine dependence, unspecified, uncomplicated: Secondary | ICD-10-CM | POA: Diagnosis not present

## 2023-11-28 DIAGNOSIS — B20 Human immunodeficiency virus [HIV] disease: Secondary | ICD-10-CM | POA: Diagnosis present

## 2023-11-28 DIAGNOSIS — Z23 Encounter for immunization: Secondary | ICD-10-CM

## 2023-11-28 MED ORDER — SYMTUZA 800-150-200-10 MG PO TABS: 1.0000 | ORAL_TABLET | Freq: Every day | ORAL | 5 refills | Status: DC

## 2023-11-28 MED ORDER — ROSUVASTATIN CALCIUM 10 MG PO TABS: 10.0000 mg | ORAL_TABLET | Freq: Every day | ORAL | 5 refills | Status: DC

## 2023-11-28 MED ORDER — TIVICAY 50 MG PO TABS: 50.0000 mg | ORAL_TABLET | Freq: Every day | ORAL | 5 refills | Status: DC

## 2023-11-28 NOTE — Assessment & Plan Note (Signed)
Eric Jefferson continues to have well controlled virus with good adherence and tolerance to [place] and Tivicay. Reviewed lab work and discussed plan of care and U equals U. Met with financial team for application for ADAP. Continue current dose of Symtuza and Tivicay. Plan for follow up in 4 months or sooner if needed with lab work on the same day.

## 2023-11-28 NOTE — Assessment & Plan Note (Signed)
Eric Jefferson continues to smoke tobacco daily.  Discussed importance of tobacco cessation to reduce risk of disease development and complications in the future.  Reviewed available options including patches, gum, and medication.  Not ready quit smoking at this time.

## 2023-11-28 NOTE — Progress Notes (Signed)
Brief Narrative   Patient ID: Eric Jefferson, male    DOB: 1960-12-18, 63 y.o.   MRN: [id number]  Eric Jefferson is a 63 y/o [place] male with HIV/AIDS diagnosed on 01/08/2008 with risk factor of heterosexual contact in the setting of TB infection with treatment completed in November 2009. Initial viral load was 52,400 and CD4 count 510. Genosure with Y181C, L210W, T215D, and A71V conferring resistance to zidovudine, didanosine, stavudine, delaviridine, efavirenz, nevirapine, and etravirine. Entered care at [facility] Stage 1. PRevious ART history with Stribild, Genvoya, Prezista/ritonavir, Prezcobix, Descovy, and currently with Symtuza and Tivicay.    Subjective:    Chief Complaint  Patient presents with   HIV Positive/AIDS    HPI:  Eric Jefferson is a 63 y.o. male with HIV disease last seen on 06/15/2023 with well-controlled virus and good adherence and tolerance to [place] and Tivicay.  Viral load was undetectable with CD4 count 528.  Kidney function, liver function, electrolytes within normal ranges.  Here today for routine follow-up.  Eric Jefferson speaks [facility] and a medical interpreter is present to aid in communication. Has been doing well since his last office visit with no new concerns/complaints. Taking medications as prescribed with no adverse side effects or problems obtaining medication from the pharmacy. Not currently working. Housing and access to food is stable. Condoms and site specific STD testing offered. Healthcare maintenance reviewed and will receive influenza vaccination.   Denies fevers, chills, night sweats, headaches, changes in vision, neck pain/stiffness, nausea, diarrhea, vomiting, lesions or rashes.  Lab Results  Component Value Date   CD4TCELL 16 (L) 10/16/2023   CD4TABS 528 10/16/2023   Lab Results  Component Value Date   HIV1RNAQUANT 26 (H) 10/16/2023     Allergies  Allergen Reactions   Soy Allergy (Do Not Select)     Upset stomach only- no other issues       Outpatient Medications Prior to Visit  Medication Sig Dispense Refill   benzonatate (TESSALON) 100 MG capsule Take 1 capsule (100 mg total) by mouth 3 (three) times daily as needed for cough. 21 capsule 0   gabapentin (NEURONTIN) 300 MG capsule Take 1 capsule (300 mg total) by mouth at bedtime. 30 capsule 11   Pyridoxine HCl (VITAMIN B-6 PO) Take by mouth.     Darunavir-Cobicistat-Emtricitabine-Tenofovir Alafenamide (SYMTUZA) 800-150-200-10 MG TABS Take 1 tablet by mouth daily with breakfast. Take with Tivicay. 30 tablet 5   dolutegravir (TIVICAY) 50 MG tablet Take 1 tablet (50 mg total) by mouth daily. Take with Symtuza. 30 tablet 5   rosuvastatin (CRESTOR) 5 MG tablet Take 1 tablet (5 mg total) by mouth daily. 30 tablet 5   No facility-administered medications prior to visit.     Past Medical History:  Diagnosis Date   Arthritis    Diabetes (HCC)    02-26-20 pt denies   Headache    HIV (human immunodeficiency virus infection) (HCC)    TB (pulmonary tuberculosis)    dx in 2009- took years worth of medication for this     History reviewed. No pertinent surgical history.    Review of Systems  Constitutional:  Negative for appetite change, chills, fatigue, fever and unexpected weight change.  Eyes:  Negative for visual disturbance.  Respiratory:  Negative for cough, chest tightness, shortness of breath and wheezing.   Cardiovascular:  Negative for chest pain and leg swelling.  Gastrointestinal:  Negative for abdominal pain, constipation, diarrhea, nausea and vomiting.  Genitourinary:  Negative for dysuria, flank pain,  frequency, genital sores, hematuria and urgency.  Skin:  Negative for rash.  Allergic/Immunologic: Negative for immunocompromised state.  Neurological:  Negative for dizziness and headaches.      Objective:    BP 112/77   Pulse 81   Temp (!) 95.5 F (35.3 C) (Oral)   Wt 120 lb (54.4 kg)   SpO2 98%   BMI 19.37 kg/m  Nursing note and vital signs  reviewed.  Physical Exam Constitutional:      General: He is not in acute distress.    Appearance: He is well-developed.  Eyes:     Conjunctiva/sclera: Conjunctivae normal.  Cardiovascular:     Rate and Rhythm: Normal rate and regular rhythm.     Heart sounds: Normal heart sounds. No murmur heard.    No friction rub. No gallop.  Pulmonary:     Effort: Pulmonary effort is normal. No respiratory distress.     Breath sounds: Normal breath sounds. No wheezing or rales.  Chest:     Chest wall: No tenderness.  Abdominal:     General: Bowel sounds are normal.     Palpations: Abdomen is soft.     Tenderness: There is no abdominal tenderness.  Musculoskeletal:     Cervical back: Neck supple.  Lymphadenopathy:     Cervical: No cervical adenopathy.  Skin:    General: Skin is warm and dry.     Findings: No rash.  Neurological:     Mental Status: He is alert and oriented to person, place, and time.  Psychiatric:        Behavior: Behavior normal.        Thought Content: Thought content normal.        Judgment: Judgment normal.         02/20/2023    9:21 AM 10/28/2022   11:00 AM 07/11/2022    9:20 AM 03/17/2022    1:51 PM 12/08/2020   10:56 AM  Depression screen PHQ 2/9  Decreased Interest 0 0 0 0 0  Down, Depressed, Hopeless 0 0 0 0 0  PHQ - 2 Score 0 0 0 0 0       Assessment & Plan:    Patient Active Problem List   Diagnosis Date Noted   Lumbar back pain 02/20/2023   Healthcare maintenance 07/11/2022   Cardiovascular disease 07/11/2022   Neck pain 03/17/2022   Fall at home, sequela 01/12/2021   Headache 12/25/2020   Acute metabolic encephalopathy 11/26/2020   Transaminitis 11/26/2020   Closed left arm fracture 12/23/2019   CKD (chronic kidney disease) stage 3, GFR 30-59 ml/min (HCC) 12/23/2019   Insomnia 12/23/2019   Hyperglycemia 09/18/2018   Seasonal allergies 01/31/2018   Anorexia 07/15/2015   Otitis media 02/09/2015   Loss of weight 12/24/2012   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 Reviewed lab work and discussed plan of care and U equals U. Met with financial team for application for ADAP. Continue current dose of Symtuza and Tivicay. Plan for follow up in 4 months or sooner if needed with lab  work on the same day.       Relevant Medications   dolutegravir (TIVICAY) 50 MG tablet   Darunavir-Cobicistat-Emtricitabine-Tenofovir Alafenamide (SYMTUZA) 800-150-200-10 MG TABS   TOBACCO ABUSE   Eric Jefferson continues to smoke tobacco daily.  Discussed importance of tobacco cessation to reduce risk of disease development and complications in the future.  Reviewed available options including patches, gum, and medication.  Not ready quit smoking at this time.      Healthcare maintenance   Discussed importance of safe sexual practice and condom use. Condoms and site specific STD testing offered.  Vaccinations reviewed - influenza updated. Due for routine dental care which he will schedule.  Colon cancer screening up to date.       Other Visit Diagnoses       Encounter for immunization       Relevant Orders   Flu vaccine trivalent PF, 6mos and older(Flulaval,Afluria,Fluarix,Fluzone) (Completed)     Need for pneumococcal 20-valent conjugate vaccination       Relevant Orders   Pneumococcal conjugate vaccine 20-valent (Completed)        I have changed Miki Sing's rosuvastatin. I am also having him maintain his Pyridoxine HCl (VITAMIN B-6 PO), gabapentin, benzonatate, Tivicay, and Symtuza.   Meds ordered this encounter   Medications   rosuvastatin (CRESTOR) 10 MG tablet    Sig: Take 1 tablet (10 mg total) by mouth daily.    Dispense:  30 tablet    Refill:  5    Supervising Provider:   Drue Second, CYNTHIA [[id number]]   dolutegravir (TIVICAY) 50 MG tablet    Sig: Take 1 tablet (50 mg total) by mouth daily. Take with Symtuza.    Dispense:  30 tablet    Refill:  5    Fill with symtuza    Supervising Provider:   Judyann Munson [[id number]]   Darunavir-Cobicistat-Emtricitabine-Tenofovir Alafenamide (SYMTUZA) 800-150-200-10 MG TABS    Sig: Take 1 tablet by mouth daily with breakfast. Take with Tivicay.    Dispense:  30 tablet    Refill:  5    Fill with tivicay    Supervising Provider:   Judyann Munson [phone]    Prescription Type::   Renewal     Follow-up: Return in about 4 months (around 03/27/2024), or if symptoms worsen or fail to improve. or sooner if needed.    Marcos Eke, MSN, FNP-C Nurse Practitioner [facility] for Infectious Disease [facility] Main number: [phone]

## 2023-11-28 NOTE — Patient Instructions (Addendum)
Nice to see you.  Continue to take your medication daily as prescribed.  Refills have been sent to the pharmacy.  Plan for follow up in 4 months or sooner if needed with lab work on the same day.  Have a great day and stay safe!

## 2023-11-28 NOTE — Assessment & Plan Note (Signed)
Discussed importance of safe sexual practice and condom use. Condoms and site specific STD testing offered.  Vaccinations reviewed - influenza updated. Due for routine dental care which he will schedule.  Colon cancer screening up to date.

## 2024-03-12 NOTE — Progress Notes (Signed)
 The 10-year ASCVD risk score (Arnett DK, et al., 2019) is: 16.4%   Values used to calculate the score:     Age: 63 years     Sex: Male     Is Non-Hispanic African American: No     Diabetic: No     Tobacco smoker: Yes     Systolic Blood Pressure: 112 mmHg     Is BP treated: No     HDL Cholesterol: 50 mg/dL     Total Cholesterol: 252 mg/dL  Arlon Bergamo, BSN, RN

## 2024-03-20 ENCOUNTER — Ambulatory Visit: Payer: Medicare Other | Admitting: Family

## 2024-03-22 ENCOUNTER — Ambulatory Visit (INDEPENDENT_AMBULATORY_CARE_PROVIDER_SITE_OTHER): Payer: Medicare Other | Admitting: Family

## 2024-03-22 ENCOUNTER — Encounter: Payer: Self-pay | Admitting: Family

## 2024-03-22 ENCOUNTER — Other Ambulatory Visit: Payer: Self-pay

## 2024-03-22 VITALS — BP 118/84 | HR 86 | Ht 66.0 in | Wt 108.0 lb

## 2024-03-22 DIAGNOSIS — I2510 Atherosclerotic heart disease of native coronary artery without angina pectoris: Secondary | ICD-10-CM | POA: Diagnosis not present

## 2024-03-22 DIAGNOSIS — Z0000 Encounter for general adult medical examination without abnormal findings: Secondary | ICD-10-CM

## 2024-03-22 DIAGNOSIS — F17210 Nicotine dependence, cigarettes, uncomplicated: Secondary | ICD-10-CM | POA: Diagnosis not present

## 2024-03-22 DIAGNOSIS — B20 Human immunodeficiency virus [HIV] disease: Secondary | ICD-10-CM | POA: Diagnosis present

## 2024-03-22 DIAGNOSIS — F17200 Nicotine dependence, unspecified, uncomplicated: Secondary | ICD-10-CM

## 2024-03-22 MED ORDER — SYMTUZA 800-150-200-10 MG PO TABS: 1.0000 | ORAL_TABLET | Freq: Every day | ORAL | 5 refills | Status: DC

## 2024-03-22 MED ORDER — ROSUVASTATIN CALCIUM 10 MG PO TABS: 10.0000 mg | ORAL_TABLET | Freq: Every day | ORAL | 5 refills | Status: DC

## 2024-03-22 MED ORDER — TIVICAY 50 MG PO TABS
50.0000 mg | ORAL_TABLET | Freq: Every day | ORAL | 5 refills | Status: DC
Start: 2024-03-22 — End: 2024-09-23

## 2024-03-22 MED ORDER — ZOSTER VAC RECOMB ADJUVANTED 50 MCG/0.5ML IM SUSR: 0.5000 mL | Freq: Once | INTRAMUSCULAR | 1 refills | Status: AC

## 2024-03-22 NOTE — Assessment & Plan Note (Signed)
 Mr. Tesoro continues to have well-controlled virus with good adherence and tolerance to Symtuza  and Tivicay .  Reviewed previous lab work and discussed plan of care and U equals U.  Social determinants of health reviewed with no interventions indicated.  Covered by Medicare with no problems obtaining medication.  Check blood work.  Continue current dose of Symtuza  and Tivicay .  Plan for follow-up in 6 months or sooner if needed with lab work on the same day.

## 2024-03-22 NOTE — Assessment & Plan Note (Signed)
 Eric Jefferson continues to smoke tobacco. Counseled on the dangers of tobacco not ready to quit at this time.  Reviewed strategies to maximize success, including removing cigarettes and smoking materials from environment, stress management, substitution of other forms of reinforcement, support of family/friends, and written materials.

## 2024-03-22 NOTE — Patient Instructions (Addendum)
 Nice to see you.  We will check your lab work today.  Continue to take your medication daily as prescribed.  Refills have been sent to the pharmacy.  Plan for follow up in 6 months or sooner if needed with lab work on the same day.  Have a great day and stay safe!   Smoking Cessation: QuitlineNC 1-800-QUIT-NOW (775)012-8931); Espaol: 1-855-Djelo-Ya (1-(905)002-5895) http://carroll-castaneda.info/

## 2024-03-22 NOTE — Assessment & Plan Note (Signed)
 Discussed importance of safe sexual practice and condom use. Condoms and site specific STD testing offered.  Vaccinations reviewed and due for Shingrix which was sent to the pharmacy Dental care is up to date Colon cancer screening up to date.

## 2024-03-22 NOTE — Assessment & Plan Note (Signed)
 Eric Jefferson remains at increased risk for cardiovascular disease with 10-year ASCVD risk of 17.9%.  Continues to take rosuvastatin  as prescribed with no adverse side effects or myalgias.  Counseled that this will help reduce risk of cardiovascular disease and HIV associated inflammation. Tobacco cessation encouraged with information in AVS.

## 2024-03-22 NOTE — Progress Notes (Signed)
 Brief Narrative   Patient ID: Eric Jefferson, male    DOB: 12/08/1960, 63 y.o.   MRN: [id number]  Eric Jefferson is a 63 y/o [place] male with HIV/AIDS diagnosed on 01/08/2008 with risk factor of heterosexual contact in the setting of TB infection with treatment completed in November 2009. Initial viral load was 52,400 and CD4 count 510. Genosure with Y181C, L210W, T215D, and A71V conferring resistance to zidovudine, didanosine, stavudine, delaviridine, efavirenz, nevirapine, and etravirine. Entered care at [facility] Stage 1. PRevious ART history with Stribild , Genvoya, Prezista /ritonavir, Prezcobix , Descovy, and currently with Symtuza  and Tivicay .    Subjective:   Chief Complaint  Patient presents with   Follow-up    HPI:  Eric Jefferson is a 63 y.o. male with HIV disease last seen on 11/28/2023 with well-controlled virus and good adherence and tolerance to Symtuza  and Tivicay .  Viral load was undetectable with CD4 count 528.  Kidney function, liver function, and electrolytes within normal ranges.  Glucose was elevated at 166.  Lipid profile with triglycerides 214, LDL 164, and HDL 50.  Here today for routine follow-up.  Eric Jefferson has been doing well since his last office visit and continues to take Symtuza  and Tivicay  as prescribed with no adverse side effects or problems obtaining medication from the pharmacy.  Covered by Medicare.  No new concerns/complaints.  Housing, access to food, and transportation are stable.  Smokes approximately half a pack of cigarettes per day.  Condoms and site-specific STD testing offered.  Denies fevers, chills, night sweats, headaches, changes in vision, neck pain/stiffness, nausea, diarrhea, vomiting, lesions or rashes.  Lab Results  Component Value Date   CD4TCELL 16 (L) 10/16/2023   CD4TABS 528 10/16/2023   Lab Results  Component Value Date   HIV1RNAQUANT 26 (H) 10/16/2023     Allergies  Allergen Reactions   Soy Allergy (Obsolete)     Upset stomach only- no other  issues      Outpatient Medications Prior to Visit  Medication Sig Dispense Refill   benzonatate  (TESSALON ) 100 MG capsule Take 1 capsule (100 mg total) by mouth 3 (three) times daily as needed for cough. (Patient not taking: Reported on 03/22/2024) 21 capsule 0   gabapentin  (NEURONTIN ) 300 MG capsule Take 1 capsule (300 mg total) by mouth at bedtime. 30 capsule 11   Pyridoxine HCl (VITAMIN B-6 PO) Take by mouth.     Darunavir -Cobicistat -Emtricitabine -Tenofovir  Alafenamide (SYMTUZA ) 800-150-200-10 MG TABS Take 1 tablet by mouth daily with breakfast. Take with Tivicay . 30 tablet 5   dolutegravir  (TIVICAY ) 50 MG tablet Take 1 tablet (50 mg total) by mouth daily. Take with Symtuza . 30 tablet 5   rosuvastatin  (CRESTOR ) 10 MG tablet Take 1 tablet (10 mg total) by mouth daily. 30 tablet 5   No facility-administered medications prior to visit.     Past Medical History:  Diagnosis Date   Arthritis    Diabetes (HCC)    02-26-20 pt denies   Headache    HIV (human immunodeficiency virus infection) (HCC)    TB (pulmonary tuberculosis)    dx in 2009- took years worth of medication for this     No past surgical history on file.   Review of Systems  Constitutional:  Negative for appetite change, chills, fatigue, fever and unexpected weight change.  Eyes:  Negative for visual disturbance.  Respiratory:  Negative for cough, chest tightness, shortness of breath and wheezing.   Cardiovascular:  Negative for chest pain and leg swelling.  Gastrointestinal:  Negative  for abdominal pain, constipation, diarrhea, nausea and vomiting.  Genitourinary:  Negative for dysuria, flank pain, frequency, genital sores, hematuria and urgency.  Skin:  Negative for rash.  Allergic/Immunologic: Negative for immunocompromised state.  Neurological:  Negative for dizziness and headaches.     Objective:   BP 118/84   Pulse 86   Ht 5\' 6"  (1.676 m)   Wt 108 lb (49 kg)   SpO2 96%   BMI 17.43 kg/m  Nursing note  and vital signs reviewed.  Physical Exam Constitutional:      General: He is not in acute distress.    Appearance: He is well-developed.  Eyes:     Conjunctiva/sclera: Conjunctivae normal.  Cardiovascular:     Rate and Rhythm: Normal rate and regular rhythm.     Heart sounds: Normal heart sounds. No murmur heard.    No friction rub. No gallop.  Pulmonary:     Effort: Pulmonary effort is normal. No respiratory distress.     Breath sounds: Normal breath sounds. No wheezing or rales.  Chest:     Chest wall: No tenderness.  Abdominal:     General: Bowel sounds are normal.     Palpations: Abdomen is soft.     Tenderness: There is no abdominal tenderness.  Musculoskeletal:     Cervical back: Neck supple.  Lymphadenopathy:     Cervical: No cervical adenopathy.  Skin:    General: Skin is warm and dry.     Findings: No rash.  Neurological:     Mental Status: He is alert and oriented to person, place, and time.  Psychiatric:        Behavior: Behavior normal.        Thought Content: Thought content normal.        Judgment: Judgment normal.          02/20/2023    9:21 AM 10/28/2022   11:00 AM 07/11/2022    9:20 AM 03/17/2022    1:51 PM 12/08/2020   10:56 AM  Depression screen PHQ 2/9  Decreased Interest 0 0 0 0 0  Down, Depressed, Hopeless 0 0 0 0 0  PHQ - 2 Score 0 0 0 0 0         No data to display           The 10-year ASCVD risk score (Arnett DK, et al., 2019) is: 17.9%   Values used to calculate the score:     Age: 63 years     Sex: Male     Is Non-Hispanic African American: No     Diabetic: No     Tobacco smoker: Yes     Systolic Blood Pressure: 118 mmHg     Is BP treated: No     HDL Cholesterol: 50 mg/dL     Total Cholesterol: 252 mg/dL      Assessment & Plan:    Patient Active Problem List   Diagnosis Date Noted   Lumbar back pain 02/20/2023   Healthcare maintenance 07/11/2022   Cardiovascular disease 07/11/2022   Neck pain 03/17/2022   Fall at  home, sequela 01/12/2021   Headache 12/25/2020   Acute metabolic encephalopathy 11/26/2020   Transaminitis 11/26/2020   Closed left arm fracture 12/23/2019   CKD (chronic kidney disease) stage 3, GFR 30-59 ml/min (HCC) 12/23/2019   Insomnia 12/23/2019   Hyperglycemia 09/18/2018   Seasonal allergies 01/31/2018   Anorexia 07/15/2015   Otitis media 02/09/2015   Loss of weight 12/24/2012  Pneumonia due to Histoplasma capsulatum fungus (HCC) 12/08/2012   Dental caries 09/09/2009   AIDS (acquired immune deficiency syndrome) (HCC) 05/06/2009   Pulmonary tuberculosis 05/09/2008   TOBACCO ABUSE 02/04/2008   GERD 02/04/2008     Problem List Items Addressed This Visit       Cardiovascular and Mediastinum   Cardiovascular disease   Eric Jefferson remains at increased risk for cardiovascular disease with 10-year ASCVD risk of 17.9%.  Continues to take rosuvastatin  as prescribed with no adverse side effects or myalgias.  Counseled that this will help reduce risk of cardiovascular disease and HIV associated inflammation. Tobacco cessation encouraged with information in AVS.       Relevant Medications   rosuvastatin  (CRESTOR ) 10 MG tablet     Other   AIDS (acquired immune deficiency syndrome) (HCC) - Primary   Eric Jefferson continues to have well-controlled virus with good adherence and tolerance to Symtuza  and Tivicay .  Reviewed previous lab work and discussed plan of care and U equals U.  Social determinants of health reviewed with no interventions indicated.  Covered by Medicare with no problems obtaining medication.  Check blood work.  Continue current dose of Symtuza  and Tivicay .  Plan for follow-up in 6 months or sooner if needed with lab work on the same day.      Relevant Medications   Darunavir -Cobicistat -Emtricitabine -Tenofovir  Alafenamide (SYMTUZA ) 800-150-200-10 MG TABS   dolutegravir  (TIVICAY ) 50 MG tablet   Zoster Vaccine Adjuvanted [facility]) injection   Other Relevant Orders    Comprehensive metabolic panel with GFR   HIV-1 RNA quant-no reflex-bld   T-helper cells (CD4) count (not at [facility])   TOBACCO ABUSE   Eric Jefferson continues to smoke tobacco. Counseled on the dangers of tobacco not ready to quit at this time.  Reviewed strategies to maximize success, including removing cigarettes and smoking materials from environment, stress management, substitution of other forms of reinforcement, support of family/friends, and written materials.        Healthcare maintenance   Discussed importance of safe sexual practice and condom use. Condoms and site specific STD testing offered.  Vaccinations reviewed and due for Shingrix which was sent to the pharmacy Dental care is up to date Colon cancer screening up to date.         I am having Eric Jefferson start on Zoster Vaccine Adjuvanted. I am also having him maintain his Pyridoxine HCl (VITAMIN B-6 PO), gabapentin , benzonatate , Symtuza , Tivicay , and rosuvastatin .   Meds ordered this encounter  Medications   Darunavir -Cobicistat -Emtricitabine -Tenofovir  Alafenamide (SYMTUZA ) 800-150-200-10 MG TABS    Sig: Take 1 tablet by mouth daily with breakfast. Take with Tivicay .    Dispense:  30 tablet    Refill:  5    Fill with tivicay     Supervising Provider:   SNIDER, CYNTHIA [phone]    Prescription Type::   Renewal   dolutegravir  (TIVICAY ) 50 MG tablet    Sig: Take 1 tablet (50 mg total) by mouth daily. Take with Symtuza .    Dispense:  30 tablet    Refill:  5    Fill with symtuza     Supervising Provider:   Liane Redman [phone]    Prescription Type::   Renewal   rosuvastatin  (CRESTOR ) 10 MG tablet    Sig: Take 1 tablet (10 mg total) by mouth daily.    Dispense:  30 tablet    Refill:  5    Supervising Provider:   SNIDER, CYNTHIA [[id number]]   Zoster Vaccine Adjuvanted [facility]) injection  Sig: Inject 0.5 mLs into the muscle once for 1 dose. Repeat in 2-6 months    Dispense:  0.5 mL    Refill:  1    Supervising Provider:    Liane Redman [phone]     Follow-up: No follow-ups on file. or sooner if needed.    Eric Silva, MSN, FNP-C Nurse Practitioner [facility] for Infectious Disease [facility] Main number: [phone]

## 2024-03-25 LAB — COMPREHENSIVE METABOLIC PANEL WITH GFR
AG Ratio: 1.4 (calc) (ref 1.0–2.5)
ALT: 9 U/L (ref 9–46)
AST: 16 U/L (ref 10–35)
Albumin: 4.6 g/dL (ref 3.6–5.1)
Alkaline phosphatase (APISO): 89 U/L (ref 35–144)
BUN/Creatinine Ratio: 9 (calc) (ref 6–22)
BUN: 13 mg/dL (ref 7–25)
CO2: 26 mmol/L (ref 20–32)
Calcium: 9.8 mg/dL (ref 8.6–10.3)
Chloride: 104 mmol/L (ref 98–110)
Creat: 1.38 mg/dL — ABNORMAL HIGH (ref 0.70–1.35)
Globulin: 3.2 g/dL (ref 1.9–3.7)
Glucose, Bld: 78 mg/dL (ref 65–99)
Potassium: 4.8 mmol/L (ref 3.5–5.3)
Sodium: 140 mmol/L (ref 135–146)
Total Bilirubin: 0.5 mg/dL (ref 0.2–1.2)
Total Protein: 7.8 g/dL (ref 6.1–8.1)
eGFR: 57 mL/min/{1.73_m2} — ABNORMAL LOW (ref >=60)

## 2024-03-25 LAB — T-HELPER CELLS (CD4) COUNT (NOT AT ARMC)
Absolute CD4: 861 {cells}/uL (ref 490–1740)
CD4 T Helper %: 16 % — ABNORMAL LOW (ref 30–61)
Total lymphocyte count: 5232 {cells}/uL — ABNORMAL HIGH (ref 850–3900)

## 2024-03-25 LAB — HIV-1 RNA QUANT-NO REFLEX-BLD
HIV 1 RNA Quant: NOT DETECTED {copies}/mL
HIV-1 RNA Quant, Log: NOT DETECTED {Log_copies}/mL

## 2024-03-26 ENCOUNTER — Ambulatory Visit: Payer: Self-pay | Admitting: Family

## 2024-03-27 NOTE — Progress Notes (Signed)
 Attempted to call patient with results. [employer] does not have a Counselling psychologist interpreter available at this time.   Elana Jian, BSN, RN

## 2024-04-01 NOTE — Progress Notes (Signed)
 Second attempt to call patient with results. [employer] does not have [employer] interpreter available at this time.   Sanaz Scarlett, BSN, RN

## 2024-04-04 NOTE — Telephone Encounter (Signed)
 Third attempt to request interpreter. [employer] states they do not service this language.   Kenishia Plack, BSN, RN

## 2024-05-28 ENCOUNTER — Telehealth: Payer: Self-pay | Admitting: Family

## 2024-05-28 NOTE — Telephone Encounter (Signed)
 Eric Jefferson requesting a callback. I returned the call but no VM was available and no MyChart.

## 2024-05-29 ENCOUNTER — Other Ambulatory Visit: Payer: Self-pay

## 2024-05-29 ENCOUNTER — Ambulatory Visit

## 2024-06-05 ENCOUNTER — Other Ambulatory Visit: Payer: Self-pay

## 2024-06-05 ENCOUNTER — Ambulatory Visit

## 2024-06-17 NOTE — Progress Notes (Deleted)
 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   Gabapentin  300mg  at bedtime Follow up one year or as needed   Subjective:  Eric Jefferson is a 63 year old right-handed [place] male with HIV/AIDS, diabetes, and history of TB who follows up for headache  He is accompanied by an interpreter.   UPDATE: Headaches are mild and occur usually once to twice a week, sometimes 3 times a week.  Usually lasts 30-60 minutes.  Sometimes takes Tylenol  but not always.     Current medication:  gabapentin  300mg  at bedtime, acetaminophen  as needed.     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  Presented with headache, weakness and questionable altered mental status.  Reported fever at home.  CT brain showed maxillary and sphenoid sinusitis.  MRI of brain with and without contrast were negative for acute intracranial abnormality.  He was started on acyclovir  empirically.  Underwent LP - CSF cell count was 14 (lymphocyte predominance) with mildly elevated protein of 62, glucose 63, negative gram stain/culture. CSF Cryptococcal antigen, fungal culture, HSV PCR and VDRL were negative.  UDS was negative.  TSH, B1, B12 and TSH were normal.  EEG was normal.  MRI of cervical and thoracic spine with and without contrast showed some degenerative changes with foraminal impingement on right at C4-5 and left at C5-6 but no cord abnormalities.     Since hospital discharge, he reports daily headaches.  It is a dull pain starting in the right paraspinal region radiating up the head and into the right eye with associated blurred vision and lacrimation in the right eye.  No nausea, vomiting, photophobia, phonophobia, ptosis, conjunctival injection, numbness or weakness.  They occur twice a day, usually in  the morning and then in the afternoon.  Treats with Tylenol  or Advil , but not daily.  No prior history of headache.  PAST MEDICAL HISTORY: Past Medical History:  Diagnosis Date   Arthritis    Diabetes (HCC)    02-26-20 pt denies   Headache    HIV (human immunodeficiency virus infection) (HCC)    TB (pulmonary tuberculosis)    dx in 2009- took years worth of medication for this    MEDICATIONS: Current Outpatient Medications on File Prior to Visit  Medication Sig Dispense Refill   benzonatate  (TESSALON ) 100 MG capsule Take 1 capsule (100 mg total) by mouth 3 (three) times daily as needed for cough. (Patient not taking: Reported on 03/22/2024) 21 capsule 0   Darunavir -Cobicistat -Emtricitabine -Tenofovir  Alafenamide (SYMTUZA ) 800-150-200-10 MG TABS Take 1 tablet by mouth daily with breakfast. Take with Tivicay . 30 tablet 5   dolutegravir  (TIVICAY ) 50 MG tablet Take 1 tablet (50 mg total) by mouth daily. Take with Symtuza . 30 tablet 5   gabapentin  (NEURONTIN ) 300 MG capsule Take 1 capsule (300 mg total) by mouth at bedtime. 30 capsule 11   Pyridoxine HCl (VITAMIN B-6 PO) Take by mouth.     rosuvastatin  (CRESTOR ) 10 MG tablet Take 1 tablet (10 mg total) by mouth daily. 30 tablet 5   [DISCONTINUED] atorvastatin  (LIPITOR) 10 MG tablet TAKE 1 TABLET(10 MG) BY MOUTH DAILY (Patient not taking: Reported on 03/04/2020) 30 tablet 0   [DISCONTINUED] traZODone  (DESYREL ) 50 MG tablet Take 0.5 tablets (25 mg total) by mouth at bedtime as needed for sleep. (Patient not  taking: Reported on 03/04/2020) 20 tablet 1   No current facility-administered medications on file prior to visit.    ALLERGIES: Allergies  Allergen Reactions   Soy Allergy (Obsolete)     Upset stomach only- no other issues    FAMILY HISTORY: Family History  Problem Relation Age of Onset   Healthy Mother    Healthy Father    Colon cancer Neg Hx    Esophageal cancer Neg Hx    Rectal cancer Neg Hx    Stomach cancer Neg Hx        Objective:  *** General: No acute distress.  Patient appears well-groomed.   Head:  Normocephalic/atraumatic Neck:  Supple.  No paraspinal tenderness.  Full range of motion. Heart:  Regular rate and rhythm. Neuro:  Alert and oriented.  Speech fluent and not dysarthric.  Language intact.  CN II-XII intact.  Bulk and tone normal.  Muscle strength 5/5 throughout.  Cannot flex *** elbow due to remote elbow injury.  Sensation to light touch intact.  Deep tendon reflexes 2+ throughout, toes downgoing.  Gait normal.  Romberg negative.    Juliene Dunnings, DO

## 2024-06-18 ENCOUNTER — Ambulatory Visit: Payer: Self-pay | Admitting: Neurology

## 2024-06-18 ENCOUNTER — Encounter: Payer: Self-pay | Admitting: Neurology

## 2024-07-29 ENCOUNTER — Other Ambulatory Visit: Payer: Self-pay

## 2024-07-29 MED ORDER — ROSUVASTATIN CALCIUM 10 MG PO TABS: 10.0000 mg | ORAL_TABLET | Freq: Every day | ORAL | 1 refills | Status: AC

## 2024-08-07 ENCOUNTER — Telehealth: Payer: Self-pay

## 2024-08-07 NOTE — Telephone Encounter (Signed)
 Patient called requesting appt this Friday for itching. States that this has been going on for a week. Informed pt that there are no open appts this week. Recommended he reach out to pcp or go to urgent care for evaluation. Lorenda CHRISTELLA Code, RMA

## 2024-08-09 ENCOUNTER — Ambulatory Visit (HOSPITAL_COMMUNITY): Admission: EM | Admit: 2024-08-09 | Discharge: 2024-08-09 | Disposition: A | Discharge status: Home / Self Care

## 2024-08-09 ENCOUNTER — Encounter (HOSPITAL_COMMUNITY): Payer: Self-pay

## 2024-08-09 DIAGNOSIS — B360 Pityriasis versicolor: Secondary | ICD-10-CM | POA: Diagnosis not present

## 2024-08-09 MED ORDER — HYDROXYZINE HCL 25 MG PO TABS: 25.0000 mg | ORAL_TABLET | Freq: Four times a day (QID) | ORAL | 0 refills | Status: DC

## 2024-08-09 MED ORDER — KETOCONAZOLE 2 % EX SHAM: MEDICATED_SHAMPOO | CUTANEOUS | 0 refills | Status: DC

## 2024-08-09 NOTE — ED Triage Notes (Signed)
 Pt present with c/o a facial rash. Pt states it has been going on for one month. States it does not hurt or itches. Pt states the rash has spread to the arms and back. Reports the spots there itch.

## 2024-08-09 NOTE — ED Provider Notes (Signed)
 [facility]    CSN: [id number] Arrival date & time: 08/09/24  0830      History   Chief Complaint Chief Complaint  Patient presents with   Rash    HPI Eric Jefferson is a 63 y.o. male.   Patient presents today due to hypopigmented and pruritic eruption of face for the past week or so.  Patient states that he is also having diffuse itching as well.  Patient denies use of anything for symptoms.  Patient denies contact with plants or changes in medications.  The history is provided by the patient.  Rash   Past Medical History:  Diagnosis Date   Arthritis    Diabetes (HCC)    02-26-20 pt denies   Headache    HIV (human immunodeficiency virus infection) (HCC)    TB (pulmonary tuberculosis)    dx in 2009- took years worth of medication for this    Patient Active Problem List   Diagnosis Date Noted   Lumbar back pain 02/20/2023   Healthcare maintenance 07/11/2022   Cardiovascular disease 07/11/2022   Neck pain 03/17/2022   Fall at home, sequela 01/12/2021   Headache 12/25/2020   Acute metabolic encephalopathy 11/26/2020   Transaminitis 11/26/2020   Closed left arm fracture 12/23/2019   CKD (chronic kidney disease) stage 3, GFR 30-59 ml/min (HCC) 12/23/2019   Insomnia 12/23/2019   Hyperglycemia 09/18/2018   Seasonal allergies 01/31/2018   Anorexia 07/15/2015   Otitis media 02/09/2015   Loss of weight 12/24/2012   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 No pertinent surgical history.     Home Medications    Prior to Admission medications   Medication Sig Start Date End Date Taking? Authorizing Provider  hydrOXYzine (ATARAX) 25 MG tablet Take 1 tablet (25 mg total) by mouth every 6 (six) hours. 08/09/24  Yes Andra Corean BROCKS, PA-C   ketoconazole (NIZORAL) 2 % shampoo apply 2% shampoo TOPICALLY to damp skin and a wide surrounding margin, lather, leave on skin for 5 minutes then rinse , repeat 3 times 08/12/24  Yes Andra Corean C, PA-C  benzonatate  (TESSALON ) 100 MG capsule Take 1 capsule (100 mg total) by mouth 3 (three) times daily as needed for cough. Patient not taking: Reported on 03/22/2024 09/25/23   Vonna Sharlet POUR, MD  Darunavir -Cobicistat -Emtricitabine -Tenofovir  Alafenamide (SYMTUZA ) 800-150-200-10 MG TABS Take 1 tablet by mouth daily with breakfast. Take with Tivicay . 03/22/24   Calone, Gregory D, FNP  dolutegravir  (TIVICAY ) 50 MG tablet Take 1 tablet (50 mg total) by mouth daily. Take with Symtuza . 03/22/24   Calone, Gregory D, FNP  gabapentin  (NEURONTIN ) 300 MG capsule Take 1 capsule (300 mg total) by mouth at bedtime. 06/19/23   Skeet Juliene SAUNDERS, DO  Pyridoxine HCl (VITAMIN B-6 PO) Take by mouth.    [provider]  rosuvastatin  (CRESTOR ) 10 MG tablet Take 1 tablet (10 mg total) by mouth daily. 07/29/24   Calone, Gregory D, FNP  atorvastatin  (LIPITOR) 10 MG tablet TAKE 1 TABLET(10 MG) BY MOUTH DAILY Patient not taking: Reported on 03/04/2020 09/03/18 07/07/20  Eben Reyes BROCKS, MD  traZODone  (DESYREL ) 50 MG tablet Take 0.5 tablets (25 mg total) by mouth at bedtime as needed for sleep. Patient not taking: Reported on 03/04/2020 12/23/19 07/07/20  Eben Reyes BROCKS, MD  Family History Family History  Problem Relation Age of Onset   Healthy Mother    Healthy Father    Colon cancer Neg Hx    Esophageal cancer Neg Hx    Rectal cancer Neg Hx    Stomach cancer Neg Hx     Social History Social History   Tobacco Use   Smoking status: Every Day    Current packs/day: 0.25    Average packs/day: 0.3 packs/day for 30.0 years (7.5 ttl pk-yrs)    Types: Cigarettes   Smokeless tobacco: Never   Tobacco comments:    3-4 cigarettes a day  Vaping Use   Vaping status: Never Used  Substance Use Topics    Alcohol use: No    Alcohol/week: 0.0 standard drinks of alcohol    Comment: former   Drug use: No     Allergies   Soy allergy (obsolete)   Review of Systems Review of Systems  Skin:  Positive for rash.     Physical Exam Triage Vital Signs ED Triage Vitals  Encounter Vitals Group     BP 08/09/24 0928 125/85     Girls Systolic BP Percentile --      Girls Diastolic BP Percentile --      Boys Systolic BP Percentile --      Boys Diastolic BP Percentile --      Pulse Rate 08/09/24 0926 73     Resp 08/09/24 0926 16     Temp 08/09/24 0926 97.6 F (36.4 C)     Temp Source 08/09/24 0926 Oral     SpO2 08/09/24 0926 97 %     Weight --      Height --      Head Circumference --      Peak Flow --      Pain Score 08/09/24 0926 0     Pain Loc --      Pain Education --      Exclude from Growth Chart --    No data found.  Updated Vital Signs BP 125/85   Pulse 73   Temp 97.6 F (36.4 C) (Oral)   Resp 16   SpO2 97%   Visual Acuity Right Eye Distance:   Left Eye Distance:   Bilateral Distance:    Right Eye Near:   Left Eye Near:    Bilateral Near:     Physical Exam Vitals and nursing note reviewed.  Constitutional:      General: He is not in acute distress.    Appearance: Normal appearance. He is not ill-appearing, toxic-appearing or diaphoretic.  Eyes:     General: No scleral icterus. Cardiovascular:     Rate and Rhythm: Normal rate and regular rhythm.     Heart sounds: Normal heart sounds.  Pulmonary:     Effort: Pulmonary effort is normal. No respiratory distress.     Breath sounds: Normal breath sounds. No wheezing or rhonchi.  Skin:    General: Skin is warm.     Comments: Hypopigmented patches noted on face  Neurological:     Mental Status: He is alert and oriented to person, place, and time.  Psychiatric:        Mood and Affect: Mood normal.        Behavior: Behavior normal.      UC Treatments / Results  Labs (all labs ordered are listed, but  only abnormal results are displayed) Labs Reviewed - No data to display  EKG   Radiology No results  found.  Procedures Procedures (including critical care time)  Medications Ordered in UC Medications - No data to display  Initial Impression / Assessment and Plan / UC Course  I have reviewed the triage vital signs and the nursing notes.  Pertinent labs & imaging results that were available during my care of the patient were reviewed by me and considered in my medical decision making (see chart for details).     Tinea versicolor-patient treated with ketoconazole  2% to be lather on head to toe, sit for 5 minutes, then rinse.  Patient advised to give 2 weeks for improvement if not and follow-up with dermatology. Final Clinical Impressions(s) / UC Diagnoses   Final diagnoses:  Tinea versicolor   Discharge Instructions   None    ED Prescriptions     Medication Sig Dispense Auth. Provider   ketoconazole (NIZORAL) 2 % shampoo apply 2% shampoo TOPICALLY to damp skin and a wide surrounding margin, lather, leave on skin for 5 minutes then rinse , repeat 3 times 120 mL Andra Krabbe C, PA-C   hydrOXYzine (ATARAX) 25 MG tablet Take 1 tablet (25 mg total) by mouth every 6 (six) hours. 30 tablet Andra Krabbe BROCKS, PA-C      PDMP not reviewed this encounter.   Andra Krabbe BROCKS, PA-C 08/09/24 1038

## 2024-09-14 ENCOUNTER — Other Ambulatory Visit: Payer: Self-pay

## 2024-09-14 ENCOUNTER — Ambulatory Visit (HOSPITAL_COMMUNITY)
Admission: EM | Admit: 2024-09-14 | Discharge: 2024-09-14 | Disposition: A | Discharge status: Home / Self Care | Attending: Emergency Medicine | Admitting: Emergency Medicine

## 2024-09-14 ENCOUNTER — Encounter (HOSPITAL_COMMUNITY): Payer: Self-pay | Admitting: *Deleted

## 2024-09-14 DIAGNOSIS — B360 Pityriasis versicolor: Secondary | ICD-10-CM | POA: Diagnosis not present

## 2024-09-14 DIAGNOSIS — R21 Rash and other nonspecific skin eruption: Secondary | ICD-10-CM

## 2024-09-14 DIAGNOSIS — L299 Pruritus, unspecified: Secondary | ICD-10-CM | POA: Diagnosis not present

## 2024-09-14 MED ORDER — DEXAMETHASONE SOD PHOSPHATE PF 10 MG/ML IJ SOLN
10.0000 mg | Freq: Once | INTRAMUSCULAR | Status: AC
  Administered 2024-09-14: 10 mg via INTRAMUSCULAR

## 2024-09-14 MED ORDER — KETOCONAZOLE 2 % EX CREA: 1.0000 | TOPICAL_CREAM | Freq: Every day | CUTANEOUS | 0 refills | Status: AC

## 2024-09-14 MED ORDER — PREDNISONE 20 MG PO TABS: 40.0000 mg | ORAL_TABLET | Freq: Every day | ORAL | 0 refills | Status: AC

## 2024-09-14 NOTE — ED Triage Notes (Signed)
 Pt states he can use [place] interpreter; via [employer] Interpreter:  Pt c/o pruritic generalized pruritus to bilat eyes, lips, and body. States same sxs as he had during previous visit on 08/09/24; states has been taking hydroxyzine and using ketoconazole shampoo, and had only slight improvement but continues with sxs.

## 2024-09-14 NOTE — ED Notes (Signed)
 [employer] interpreter services paged again.

## 2024-09-14 NOTE — ED Triage Notes (Signed)
 Pt brought to [facility] exam room; awaiting response for [facility] interpreter.

## 2024-09-14 NOTE — ED Provider Notes (Signed)
 [facility]    CSN: [id number] Arrival date & time: 09/14/24  9060      History   Chief Complaint Chief Complaint  Patient presents with   Pruritis    HPI Eric Jefferson is a 63 y.o. male.   Patient is with pruritus to bilateral eyelids, lips, chest, and legs that began more than a month ago.  Patient states that he was previously seen on 10/10 and was prescribed ketoconazole shampoo and hydroxyzine which she has been using with minimal improvement.  Patient states that his symptoms did seem to get better, but then became worse and seems to be spreading.  The history is provided by the patient and medical records. The history is limited by a language barrier. A language interpreter was used ([place]namese interpreter).    Past Medical History:  Diagnosis Date   Arthritis    Diabetes (HCC)    02-26-20 pt denies   Headache    HIV (human immunodeficiency virus infection) (HCC)    TB (pulmonary tuberculosis)    dx in 2009- took years worth of medication for this    Patient Active Problem List   Diagnosis Date Noted   Lumbar back pain 02/20/2023   Healthcare maintenance 07/11/2022   Cardiovascular disease 07/11/2022   Neck pain 03/17/2022   Fall at home, sequela 01/12/2021   Headache 12/25/2020   Acute metabolic encephalopathy 11/26/2020   Transaminitis 11/26/2020   Closed left arm fracture 12/23/2019   CKD (chronic kidney disease) stage 3, GFR 30-59 ml/min (HCC) 12/23/2019   Insomnia 12/23/2019   Hyperglycemia 09/18/2018   Seasonal allergies 01/31/2018   Anorexia 07/15/2015   Otitis media 02/09/2015   Loss of weight 12/24/2012   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 No pertinent surgical history.     Home Medications    Prior to Admission  medications   Medication Sig Start Date End Date Taking? Authorizing Provider  Darunavir -Cobicistat -Emtricitabine -Tenofovir  Alafenamide (SYMTUZA ) 800-150-200-10 MG TABS Take 1 tablet by mouth daily with breakfast. Take with Tivicay . 03/22/24  Yes Calone, Gregory D, FNP  dolutegravir  (TIVICAY ) 50 MG tablet Take 1 tablet (50 mg total) by mouth daily. Take with Symtuza . 03/22/24  Yes Calone, Gregory D, FNP  gabapentin  (NEURONTIN ) 300 MG capsule Take 1 capsule (300 mg total) by mouth at bedtime. 06/19/23  Yes Jaffe, Adam R, DO  hydrOXYzine (ATARAX) 25 MG tablet Take 1 tablet (25 mg total) by mouth every 6 (six) hours. 08/09/24  Yes Andra Krabbe C, PA-C  ketoconazole (NIZORAL) 2 % cream Apply 1 Application topically daily. 09/14/24  Yes Johnie, Juleen Sorrels A, NP  predniSONE  (DELTASONE ) 20 MG tablet Take 2 tablets (40 mg total) by mouth daily for 5 days. 09/14/24 09/19/24 Yes Lazer Wollard A, NP  Pyridoxine HCl (VITAMIN B-6 PO) Take by mouth.   Yes [provider]  rosuvastatin  (CRESTOR ) 10 MG tablet Take 1 tablet (10 mg total) by mouth daily. 07/29/24  Yes Calone, Gregory D, FNP  benzonatate  (TESSALON ) 100 MG capsule Take 1 capsule (100 mg total) by mouth 3 (three) times daily as needed for cough. Patient not taking: Reported on 03/22/2024 09/25/23   Vonna Sharlet POUR, MD  atorvastatin  (LIPITOR) 10 MG tablet TAKE 1 TABLET(10 MG) BY MOUTH DAILY Patient not taking: Reported on 03/04/2020 09/03/18 07/07/20  Eben Purchase  C, MD  traZODone  (DESYREL ) 50 MG tablet Take 0.5 tablets (25 mg total) by mouth at bedtime as needed for sleep. Patient not taking: Reported on 03/04/2020 12/23/19 07/07/20  Eben Reyes BROCKS, MD    Family History Family History  Problem Relation Age of Onset   Healthy Mother    Healthy Father    Colon cancer Neg Hx    Esophageal cancer Neg Hx    Rectal cancer Neg Hx    Stomach cancer Neg Hx     Social History Social History   Tobacco Use   Smoking status: Every Day     Current packs/day: 0.25    Average packs/day: 0.3 packs/day for 30.0 years (7.5 ttl pk-yrs)    Types: Cigarettes   Smokeless tobacco: Never   Tobacco comments:    3-4 cigarettes a day  Vaping Use   Vaping status: Never Used  Substance Use Topics   Alcohol use: Not Currently   Drug use: No     Allergies   Soy allergy (obsolete)   Review of Systems Review of Systems  Per HPI  Physical Exam Triage Vital Signs ED Triage Vitals  Encounter Vitals Group     BP 09/14/24 1112 122/83     Girls Systolic BP Percentile --      Girls Diastolic BP Percentile --      Boys Systolic BP Percentile --      Boys Diastolic BP Percentile --      Pulse Rate 09/14/24 1112 70     Resp 09/14/24 1112 16     Temp 09/14/24 1112 98.1 F (36.7 C)     Temp Source 09/14/24 1112 Oral     SpO2 09/14/24 1112 96 %     Weight --      Height --      Head Circumference --      Peak Flow --      Pain Score 09/14/24 1116 0     Pain Loc --      Pain Education --      Exclude from Growth Chart --    No data found.  Updated Vital Signs BP 122/83   Pulse 70   Temp 98.1 F (36.7 C) (Oral)   Resp 16   SpO2 96%   Visual Acuity Right Eye Distance:   Left Eye Distance:   Bilateral Distance:    Right Eye Near:   Left Eye Near:    Bilateral Near:     Physical Exam Vitals and nursing note reviewed.  Constitutional:      General: He is awake. He is not in acute distress.    Appearance: Normal appearance. He is well-developed and well-groomed. He is not ill-appearing.  Skin:    General: Skin is warm and dry.     Findings: Rash present. Rash is papular.     Comments: Patchy hypopigmented stated areas noted to face and a few noted to his chest and legs as well. Patient also has a mildly erythematous papular rash noted to his chest as well.  Neurological:     Mental Status: He is alert.  Psychiatric:        Behavior: Behavior is cooperative.      UC Treatments / Results  Labs (all labs  ordered are listed, but only abnormal results are displayed) Labs Reviewed - No data to display  EKG   Radiology No results found.  Procedures Procedures (including critical care time)  Medications Ordered in UC Medications  dexamethasone (DECADRON) injection 10 mg (has no administration in time range)    Initial Impression / Assessment and Plan / UC Course  I have reviewed the triage vital signs and the nursing notes.  Pertinent labs & imaging results that were available during my care of the patient were reviewed by me and considered in my medical decision making (see chart for details).     Patient is overall well-appearing.  Vitals are stable.  Prescribed ketoconazole cream for tinea versicolor coverage of the face.  Given IM Decadron in clinic to help with rash and itching.  Prescribed a short course of prednisone  for additional relief of this.  Recommended continuing with hydroxyzine as needed for itching.    Given dermatology follow-up.  Discussed follow-up and return precautions. Final Clinical Impressions(s) / UC Diagnoses   Final diagnoses:  Pruritus  Rash and nonspecific skin eruption  Tinea versicolor     Discharge Instructions      B?n c th? bi kem ketoconazole ln m?t m?i ngy m?t l?n. Hm nay b?n ? ???c tim Decadron t?i phng khm, ?y l m?t lo?i steroid. Ngy mai b?t ??u u?ng 2 vin prednisone  m?i ngy m?t l?n trong 5 ngy. B?n c th? ti?p t?c u?ng hydroxyzine m?i 8 gi? n?u c?n ?? gi?m ng?a. Hy ??n g?p bc s? da li?u n?u cc tri?u ch?ng c?a b?n v?n ti?p di?n ?? ?nh gi thm.  You can apply ketoconazole cream once daily to your face. You were given an injection of Decadron in clinic today which is a steroid. Tomorrow start taking 2 tablets of prednisone  once daily for 5 days. You can continue taking hydroxyzine every 8 hours as needed for itching. Follow-up with dermatologist if your symptoms continue for further evaluation.   ED  Prescriptions     Medication Sig Dispense Auth. Provider   ketoconazole (NIZORAL) 2 % cream Apply 1 Application topically daily. 15 g Johnie Flaming A, NP   predniSONE  (DELTASONE ) 20 MG tablet Take 2 tablets (40 mg total) by mouth daily for 5 days. 10 tablet Johnie Flaming A, NP      PDMP not reviewed this encounter.   Johnie Flaming A, NP 09/14/24 1146

## 2024-09-14 NOTE — ED Notes (Signed)
 [employer] interpreter services paged.

## 2024-09-14 NOTE — Discharge Instructions (Signed)
 B?n c th? bi kem ketoconazole ln m?t m?i ngy m?t l?n. Hm nay b?n ? ???c tim Decadron t?i phng khm, ?y l m?t lo?i steroid. Ngy mai b?t ??u u?ng 2 vin prednisone  m?i ngy m?t l?n trong 5 ngy. B?n c th? ti?p t?c u?ng hydroxyzine m?i 8 gi? n?u c?n ?? gi?m ng?a. Hy ??n g?p bc s? da li?u n?u cc tri?u ch?ng c?a b?n v?n ti?p di?n ?? ?nh gi thm.  You can apply ketoconazole cream once daily to your face. You were given an injection of Decadron in clinic today which is a steroid. Tomorrow start taking 2 tablets of prednisone  once daily for 5 days. You can continue taking hydroxyzine every 8 hours as needed for itching. Follow-up with dermatologist if your symptoms continue for further evaluation.

## 2024-09-21 ENCOUNTER — Ambulatory Visit (HOSPITAL_COMMUNITY)
Admission: EM | Admit: 2024-09-21 | Discharge: 2024-09-21 | Disposition: A | Discharge status: Home / Self Care | Attending: Nurse Practitioner | Admitting: Nurse Practitioner

## 2024-09-21 ENCOUNTER — Ambulatory Visit (INDEPENDENT_AMBULATORY_CARE_PROVIDER_SITE_OTHER)

## 2024-09-21 ENCOUNTER — Encounter (HOSPITAL_COMMUNITY): Payer: Self-pay | Admitting: Emergency Medicine

## 2024-09-21 DIAGNOSIS — M25511 Pain in right shoulder: Secondary | ICD-10-CM | POA: Diagnosis not present

## 2024-09-21 MED ORDER — IBUPROFEN 800 MG PO TABS
ORAL_TABLET | ORAL | Status: AC
  Filled 2024-09-21: qty 1

## 2024-09-21 MED ORDER — IBUPROFEN 800 MG PO TABS: 800.0000 mg | ORAL_TABLET | Freq: Three times a day (TID) | ORAL | 0 refills | Status: DC | PRN

## 2024-09-21 MED ORDER — IBUPROFEN 800 MG PO TABS
800.0000 mg | ORAL_TABLET | Freq: Once | ORAL | Status: AC
  Administered 2024-09-21: 800 mg via ORAL

## 2024-09-21 NOTE — ED Provider Notes (Signed)
 [facility]    CSN: [id number] Arrival date & time: 09/21/24  0845      History   Chief Complaint Chief Complaint  Patient presents with   Shoulder Injury   Back Pain    HPI Eric Jefferson is a 63 y.o. male.   Discussed the use of AI scribe software for clinical note transcription with the patient, who gave verbal consent to proceed.   Patient presents with right shoulder pain following a fall. The patient tripped and missed a step, resulting in the fall. The patient denies dizziness, lightheadedness, or other precipitating factors prior to the fall. The patient is able to move the arm despite pain and reports normal sensation without numbness. There is some mild associated right sided neck pain but no stiffness or swelling noted. The patient did not lose consciousness or sustain head trauma during the fall. No medications, ice, or heat have been tried for symptom management. The patient reports no known medication allergies.  The following sections of the patient's history were reviewed and updated as appropriate: allergies, current medications, past family history, past medical history, past social history, past surgical history, and problem list.               Past Medical History:  Diagnosis Date   Arthritis    Diabetes (HCC)    02-26-20 pt denies   Headache    HIV (human immunodeficiency virus infection) (HCC)    TB (pulmonary tuberculosis)    dx in 2009- took years worth of medication for this    Patient Active Problem List   Diagnosis Date Noted   Lumbar back pain 02/20/2023   Healthcare maintenance 07/11/2022   Cardiovascular disease 07/11/2022   Neck pain 03/17/2022   Fall at home, sequela 01/12/2021   Headache 12/25/2020   Acute metabolic encephalopathy 11/26/2020   Transaminitis 11/26/2020   Closed left arm fracture 12/23/2019   CKD (chronic kidney disease) stage 3, GFR 30-59 ml/min (HCC) 12/23/2019   Insomnia 12/23/2019   Hyperglycemia  09/18/2018   Seasonal allergies 01/31/2018   Anorexia 07/15/2015   Otitis media 02/09/2015   Loss of weight 12/24/2012   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 No pertinent surgical history.     Home Medications    Prior to Admission medications   Medication Sig Start Date End Date Taking? Authorizing Provider  ibuprofen  (ADVIL ) 800 MG tablet Take 1 tablet (800 mg total) by mouth every 8 (eight) hours as needed (pain). Take with food to avoid stomach upset. Do not take any additional NSAIDs while on this. You may take tylenol  in addition to this if needed for extra pain relief. 09/21/24  Yes Artasia Thang, FNP  Darunavir -Cobicistat -Emtricitabine -Tenofovir  Alafenamide (SYMTUZA ) 800-150-200-10 MG TABS Take 1 tablet by mouth daily with breakfast. Take with Tivicay . 03/22/24   Calone, Gregory D, FNP  dolutegravir  (TIVICAY ) 50 MG tablet Take 1 tablet (50 mg total) by mouth daily. Take with Symtuza . 03/22/24   Calone, Gregory D, FNP  gabapentin  (NEURONTIN ) 300 MG capsule Take 1 capsule (300 mg total) by mouth at bedtime. 06/19/23   Skeet Juliene SAUNDERS, DO  hydrOXYzine  (ATARAX ) 25 MG tablet Take 1 tablet (25 mg total) by mouth every 6 (six) hours. 08/09/24   Andra Corean BROCKS, PA-C  ketoconazole  (NIZORAL ) 2 % cream  Apply 1 Application topically daily. 09/14/24   Johnie Flaming A, NP  Pyridoxine HCl (VITAMIN B-6 PO) Take by mouth.    [provider]  rosuvastatin  (CRESTOR ) 10 MG tablet Take 1 tablet (10 mg total) by mouth daily. 07/29/24   Calone, Gregory D, FNP  atorvastatin  (LIPITOR) 10 MG tablet TAKE 1 TABLET(10 MG) BY MOUTH DAILY Patient not taking: Reported on 03/04/2020 09/03/18 07/07/20  Eben Reyes BROCKS, MD  traZODone  (DESYREL ) 50 MG tablet Take 0.5 tablets (25 mg total)  by mouth at bedtime as needed for sleep. Patient not taking: Reported on 03/04/2020 12/23/19 07/07/20  Eben Reyes BROCKS, MD    Family History Family History  Problem Relation Age of Onset   Healthy Mother    Healthy Father    Colon cancer Neg Hx    Esophageal cancer Neg Hx    Rectal cancer Neg Hx    Stomach cancer Neg Hx     Social History Social History   Tobacco Use   Smoking status: Every Day    Current packs/day: 0.25    Average packs/day: 0.3 packs/day for 30.0 years (7.5 ttl pk-yrs)    Types: Cigarettes   Smokeless tobacco: Never   Tobacco comments:    3-4 cigarettes a day  Vaping Use   Vaping status: Never Used  Substance Use Topics   Alcohol use: Not Currently   Drug use: No     Allergies   Soy allergy (obsolete)   Review of Systems Review of Systems  Musculoskeletal:  Positive for neck pain (right).       Right shoulder pain   Neurological:  Negative for numbness.  All other systems reviewed and are negative.    Physical Exam Triage Vital Signs ED Triage Vitals [09/21/24 0859]  Encounter Vitals Group     BP 108/74     Girls Systolic BP Percentile      Girls Diastolic BP Percentile      Boys Systolic BP Percentile      Boys Diastolic BP Percentile      Pulse Rate 81     Resp 17     Temp 98.1 F (36.7 C)     Temp Source Oral     SpO2 96 %     Weight      Height      Head Circumference      Peak Flow      Pain Score 8     Pain Loc      Pain Education      Exclude from Growth Chart    No data found.  Updated Vital Signs BP 108/74 (BP Location: Left Arm)   Pulse 81   Temp 98.1 F (36.7 C) (Oral)   Resp 17   SpO2 96%   Visual Acuity Right Eye Distance:   Left Eye Distance:   Bilateral Distance:    Right Eye Near:   Left Eye Near:    Bilateral Near:     Physical Exam Vitals reviewed.  Constitutional:      General: He is awake. He is not in acute distress.    Appearance: Normal appearance. He is well-developed. He is not  ill-appearing, toxic-appearing or diaphoretic.  HENT:     Head: Normocephalic.     Right Ear: Hearing normal.     Left Ear: Hearing normal.     Nose: Nose normal.     Mouth/Throat:     Mouth: Mucous membranes are moist.  Eyes:  General: Vision grossly intact.     Conjunctiva/sclera: Conjunctivae normal.  Neck:      Comments: Right lateral muscle tenderness noted with arm movement, associated with right shoulder pain. Cardiovascular:     Rate and Rhythm: Normal rate and regular rhythm.     Heart sounds: Normal heart sounds.  Pulmonary:     Effort: Pulmonary effort is normal.     Breath sounds: Normal breath sounds and air entry.  Musculoskeletal:     Right shoulder: Tenderness present. No swelling, deformity, effusion or crepitus. Decreased range of motion. Normal strength.       Arms:     Cervical back: Normal range of motion and neck supple. No edema, erythema, rigidity, torticollis or crepitus. Pain with movement and muscular tenderness present. No spinous process tenderness. Normal range of motion.     Comments: Pain is noted over the anterior aspect of the right shoulder with reduced range of motion due to discomfort. No deformity is observed. Strength and sensation in the right upper extremity are intact, and neurovascular status is normal.  Lymphadenopathy:     Cervical: No cervical adenopathy.  Skin:    General: Skin is warm and dry.  Neurological:     General: No focal deficit present.     Mental Status: He is alert and oriented to person, place, and time.     Sensory: Sensation is intact. No sensory deficit.     Motor: Motor function is intact. No weakness.  Psychiatric:        Mood and Affect: Mood and affect normal.        Speech: Speech normal.        Behavior: Behavior is cooperative.      UC Treatments / Results  Labs (all labs ordered are listed, but only abnormal results are displayed) Labs Reviewed - No data to display  EKG   Radiology DG  Shoulder Right Result Date: 09/21/2024 EXAM: 1 VIEW(S) XRAY OF THE RIGHT SHOULDER 09/21/2024 09:57:51 AM COMPARISON: 11/29/2016 CLINICAL HISTORY: right shoulder pain s/p fall 3 days ago FINDINGS: BONES AND JOINTS: Glenohumeral joint is normally aligned. No acute fracture or dislocation. The [facility] joint is unremarkable in appearance. SOFT TISSUES: No abnormal calcifications. Visualized lung is unremarkable. IMPRESSION: 1. No acute fracture or dislocation. Electronically signed by: Waddell Calk MD 09/21/2024 10:02 AM EST RP Workstation: [id number]    Procedures Procedures (including critical care time)  Medications Ordered in UC Medications  ibuprofen  (ADVIL ) tablet 800 mg (800 mg Oral Given 09/21/24 0956)    Initial Impression / Assessment and Plan / UC Course  I have reviewed the triage vital signs and the nursing notes.  Pertinent labs & imaging results that were available during my care of the patient were reviewed by me and considered in my medical decision making (see chart for details).     The patient presents with right shoulder pain after a mechanical fall in which she tripped and missed a step. She denies dizziness, lightheadedness, head injury, or loss of consciousness. She is able to move the arm despite pain, with normal sensation and no numbness or swelling. Exam shows localized tenderness of the right shoulder with movement and palpation. X-ray imaging shows normal glenohumeral alignment with no evidence of fracture or dislocation, and the AC joint appears normal. Findings are most consistent with a musculoskeletal strain. She was prescribed ibuprofen  800 mg for pain and instructed to avoid any additional over-the-counter NSAIDs while taking this medication. Conservative treatment was advised, including  icing the shoulder three times daily for 20 minutes using a towel as a barrier, performing gentle range-of-motion exercises, and avoiding heavy lifting or pulling with the right arm.  She should follow up with orthopedics if her symptoms do not improve within 7-10 days or sooner if pain significantly worsens. Emergency evaluation is recommended for severe swelling, loss of movement, numbness, or sudden worsening pain.  Today's evaluation has revealed no signs of a dangerous process. Discussed diagnosis with patient and/or guardian. Patient and/or guardian aware of their diagnosis, possible red flag symptoms to watch out for and need for close follow up. Patient and/or guardian understands verbal and written discharge instructions. Patient and/or guardian comfortable with plan and disposition.  Patient and/or guardian has a clear mental status at this time, good insight into illness (after discussion and teaching) and has clear judgment to make decisions regarding their care  Documentation was completed with the aid of voice recognition software. Transcription may contain typographical errors.  Final Clinical Impressions(s) / UC Diagnoses   Final diagnoses:  Acute pain of right shoulder     Discharge Instructions      [facility]ney adrui hrh m?ng amah nao  Hrao adrei an hri?n  a k? tha pk nao hri?. Hru?h X-ray adrei hl?ng  k? adrei nao  Hrao k? sit  an, mo dlh, mo hl hri?m. Adui ko mlah  ibuprofen  800 mg mo ko p n?h adrei k? adu hlu, hlei mo mlah thm  thu?c NSAIDs ngoi qu?y hl?i Advil  ho?k Aleve mo ko mlah thun nao. N?h  h?, ko hl?i mlah Tylenol  (acetaminophen ) 1000 mg hl?i b?k  dr ch?p hr?h  adrei. N?h adrei  dua 500 mg  at?k. Hl?i m?ng hl?ng h?h hl?i m? k? 4000 mg Tylenol   m?ng 24 gi?.  ??ng amah p?ng s?n, ko ??ng bang  adrei nao  Hrao  m??n 20 pht  mu, adrei 3 mu hma. Ko hl?ng  kh?n adrei k? tav?  bang hri? b?h hri?t k? da. Hlei ko t?ng k? adu khan, k? rk, k? pk  Hrao, b?h mo amah hlim hl?i hl?n h?h  Hrao nao.  No la ko m?ng hl?i 7-10 h?h b?h hlim, ko hl?i k?p k dr marla? bc s? x??ng kh?p ho?k bc s? ch?m [place] ban ??u. Ko hl?i ?i  b?nh vi?n ngay la ko adrei m?ng hl?ng h?h  k? t?k, hri?t hlim, t?k b?h t?ng, t?k rk, ho?k no ko b?h adu k? hrao nao hlim.  Your shoulder pain today appears to be from a muscle strain after your fall. The X-ray showed that your shoulder joint is in the normal position and there are no fractures or dislocations. You may take the prescribed ibuprofen  800 mg as directed for pain, but do not take any additional over-the-counter NSAIDs like Advil  or Aleve while using this medication. If needed, you may take Tylenol  (acetaminophen ) 1000 mg every six hours for additional pain relief. This equals two 500 mg tablets at a time. Be careful not to take more than 4000 mg of Tylenol  in a 24-hour period. To help with healing, apply ice to your shoulder for 20 minutes at a time, three times a day, making sure to place a towel between the ice and your skin. Gentle range-of-motion movements can help prevent stiffness, but avoid heavy lifting, pushing, or pulling with your right arm until the pain improves. If your symptoms are not better within 7-10 days, schedule a visit with an orthopedist or your primary care provider. Go to  the emergency department right away if you develop severe swelling, worsening pain, numbness, weakness, or if you suddenly cannot move the arm.     ED Prescriptions     Medication Sig Dispense Auth. Provider   ibuprofen  (ADVIL ) 800 MG tablet Take 1 tablet (800 mg total) by mouth every 8 (eight) hours as needed (pain). Take with food to avoid stomach upset. Do not take any additional NSAIDs while on this. You may take tylenol  in addition to this if needed for extra pain relief. 21 tablet Iola Lukes, FNP      PDMP not reviewed this encounter.   Iola Lukes, [place] 09/21/24 1047

## 2024-09-21 NOTE — ED Triage Notes (Signed)
 Pt reports fell 3 days ago and hurt right shoulder. Pt c/o back pain as well. Hasn't tried anything to help with pain

## 2024-09-21 NOTE — Discharge Instructions (Addendum)
 Klei adrui hrh m?ng amah nao  Hrao adrei an hri?n  a k? tha pk nao hri?. Hru?h X-ray adrei hl?ng  k? adrei nao  Hrao k? sit  an, mo dlh, mo hl hri?m. Adui ko mlah  ibuprofen  800 mg mo ko p n?h adrei k? adu hlu, hlei mo mlah thm  thu?c NSAIDs ngoi qu?y hl?i Advil  ho?k Aleve mo ko mlah thun nao. N?h  h?, ko hl?i mlah Tylenol  (acetaminophen ) 1000 mg hl?i b?k  dr ch?p hr?h  adrei. N?h adrei  dua 500 mg  at?k. Hl?i m?ng hl?ng h?h hl?i m? k? 4000 mg Tylenol   m?ng 24 gi?.  ??ng amah p?ng s?n, ko ??ng bang  adrei nao  Hrao  m??n 20 pht  mu, adrei 3 mu hma. Ko hl?ng  kh?n adrei k? tav?  bang hri? b?h hri?t k? da. Hlei ko t?ng k? adu khan, k? rk, k? pk  Hrao, b?h mo amah hlim hl?i hl?n h?h  Hrao nao.  No la ko m?ng hl?i 7-10 h?h b?h hlim, ko hl?i k?p k dr marla? bc s? x??ng kh?p ho?k bc s? ch?m [place] ban ??u. Ko hl?i ?i b?nh vi?n ngay la ko adrei m?ng hl?ng h?h  k? t?k, hri?t hlim, t?k b?h t?ng, t?k rk, ho?k no ko b?h adu k? hrao nao hlim.  Your shoulder pain today appears to be from a muscle strain after your fall. The X-ray showed that your shoulder joint is in the normal position and there are no fractures or dislocations. You may take the prescribed ibuprofen  800 mg as directed for pain, but do not take any additional over-the-counter NSAIDs like Advil  or Aleve while using this medication. If needed, you may take Tylenol  (acetaminophen ) 1000 mg every six hours for additional pain relief. This equals two 500 mg tablets at a time. Be careful not to take more than 4000 mg of Tylenol  in a 24-hour period. To help with healing, apply ice to your shoulder for 20 minutes at a time, three times a day, making sure to place a towel between the ice and your skin. Gentle range-of-motion movements can help prevent stiffness, but avoid heavy lifting, pushing, or pulling with your right arm until the pain improves. If your symptoms are not better within 7-10 days, schedule  a visit with an orthopedist or your primary care provider. Go to the emergency department right away if you develop severe swelling, worsening pain, numbness, weakness, or if you suddenly cannot move the arm.

## 2024-09-23 ENCOUNTER — Ambulatory Visit (INDEPENDENT_AMBULATORY_CARE_PROVIDER_SITE_OTHER): Admitting: Family

## 2024-09-23 ENCOUNTER — Other Ambulatory Visit: Payer: Self-pay

## 2024-09-23 ENCOUNTER — Encounter: Payer: Self-pay | Admitting: Family

## 2024-09-23 VITALS — BP 138/92 | HR 75 | Temp 97.5°F | Wt 110.0 lb

## 2024-09-23 DIAGNOSIS — F17210 Nicotine dependence, cigarettes, uncomplicated: Secondary | ICD-10-CM

## 2024-09-23 DIAGNOSIS — B20 Human immunodeficiency virus [HIV] disease: Secondary | ICD-10-CM

## 2024-09-23 DIAGNOSIS — Z79899 Other long term (current) drug therapy: Secondary | ICD-10-CM

## 2024-09-23 DIAGNOSIS — F17200 Nicotine dependence, unspecified, uncomplicated: Secondary | ICD-10-CM

## 2024-09-23 DIAGNOSIS — Z0000 Encounter for general adult medical examination without abnormal findings: Secondary | ICD-10-CM

## 2024-09-23 MED ORDER — TIVICAY 50 MG PO TABS: 50.0000 mg | ORAL_TABLET | Freq: Every day | ORAL | 5 refills | Status: AC

## 2024-09-23 MED ORDER — NICOTINE 14 MG/24HR TD PT24
14.0000 mg | MEDICATED_PATCH | Freq: Every day | TRANSDERMAL | 0 refills | Status: AC
Start: 2024-09-23 — End: ?

## 2024-09-23 MED ORDER — SYMTUZA 800-150-200-10 MG PO TABS: 1.0000 | ORAL_TABLET | Freq: Every day | ORAL | 5 refills | Status: AC

## 2024-09-23 NOTE — Progress Notes (Signed)
 Brief Narrative   Patient ID: Leonidas Atchley, male    DOB: Jun 13, 1961, 63 y.o.   MRN: [id number]  Mr. Lamere is a 63 y/o Vietnamese male with HIV/AIDS diagnosed on 01/08/2008 with risk factor of heterosexual contact in the setting of TB infection with treatment completed in November 2009. Initial viral load was 52,400 and CD4 count 510. Genosure with Y181C, L210W, T215D, and A71V conferring resistance to zidovudine, didanosine, stavudine, delaviridine, efavirenz, nevirapine, and etravirine. Entered care at [facility] Stage 1. PRevious ART history with Stribild , Genvoya, Prezista /ritonavir, Prezcobix , Descovy, and currently with Symtuza  and Tivicay .    Subjective:   Chief Complaint  Patient presents with   Follow-up    Flu vaccine-     HPI:  Bannon Dise is a 63 y.o. male with HIV disease last seen on 03/22/2024 with well-controlled virus and good adherence and tolerance to Symtuza  and Tivicay .  Viral load was undetectable with CD4 count 861.  Renal function mildly decreased with creatinine of 1.38 and estimated GFR 57 indicating chronic kidney disease stage III.  Liver function, electrolytes within normal ranges.  Here today for routine follow-up.  Mr. Xiang primary preferred language is Montagnard and a medical interpreter is present to aid in communication.  Doing well since last office visit and continues to take Symtuza  and Tivicay  as prescribed with no adverse side effects or problems obtaining medication from the pharmacy.  Covered by Medicare.  Working full-time with housing, transportation, and access to food stable.  Interested in quitting smoking.  Healthcare maintenance reviewed.  Condoms and site-specific STD testing offered.  Denies fevers, chills, night sweats, headaches, changes in vision, neck pain/stiffness, nausea, diarrhea, vomiting, lesions or rashes.  Lab Results  Component Value Date   CD4TCELL 16 (L) 03/22/2024   CD4TABS 528 10/16/2023   Lab Results  Component Value Date    HIV1RNAQUANT NOT DETECTED 03/22/2024     Allergies  Allergen Reactions   Soy Allergy (Obsolete)     Upset stomach only- no other issues      Outpatient Medications Prior to Visit  Medication Sig Dispense Refill   gabapentin  (NEURONTIN ) 300 MG capsule Take 1 capsule (300 mg total) by mouth at bedtime. 30 capsule 11   hydrOXYzine  (ATARAX ) 25 MG tablet Take 1 tablet (25 mg total) by mouth every 6 (six) hours. 30 tablet 0   ibuprofen  (ADVIL ) 800 MG tablet Take 1 tablet (800 mg total) by mouth every 8 (eight) hours as needed (pain). Take with food to avoid stomach upset. Do not take any additional NSAIDs while on this. You may take tylenol  in addition to this if needed for extra pain relief. 21 tablet 0   ketoconazole  (NIZORAL ) 2 % cream Apply 1 Application topically daily. 15 g 0   Pyridoxine HCl (VITAMIN B-6 PO) Take by mouth.     rosuvastatin  (CRESTOR ) 10 MG tablet Take 1 tablet (10 mg total) by mouth daily. 90 tablet 1   Darunavir -Cobicistat -Emtricitabine -Tenofovir  Alafenamide (SYMTUZA ) 800-150-200-10 MG TABS Take 1 tablet by mouth daily with breakfast. Take with Tivicay . 30 tablet 5   dolutegravir  (TIVICAY ) 50 MG tablet Take 1 tablet (50 mg total) by mouth daily. Take with Symtuza . 30 tablet 5   No facility-administered medications prior to visit.     Past Medical History:  Diagnosis Date   Arthritis    Diabetes (HCC)    02-26-20 pt denies   Headache    HIV (human immunodeficiency virus infection) (HCC)    TB (pulmonary tuberculosis)  dx in 2009- took years worth of medication for this     No past surgical history on file.   Review of Systems  Constitutional:  Negative for appetite change, chills, fatigue, fever and unexpected weight change.  Eyes:  Negative for visual disturbance.  Respiratory:  Negative for cough, chest tightness, shortness of breath and wheezing.   Cardiovascular:  Negative for chest pain and leg swelling.  Gastrointestinal:  Negative for abdominal  pain, constipation, diarrhea, nausea and vomiting.  Genitourinary:  Negative for dysuria, flank pain, frequency, genital sores, hematuria and urgency.  Skin:  Negative for rash.  Allergic/Immunologic: Negative for immunocompromised state.  Neurological:  Negative for dizziness and headaches.     Objective:   BP (!) 138/92   Pulse 75   Temp (!) 97.5 F (36.4 C) (Oral)   Wt 110 lb (49.9 kg)   SpO2 99%   BMI 17.75 kg/m  Nursing note and vital signs reviewed.  Physical Exam Constitutional:      General: He is not in acute distress.    Appearance: He is well-developed.  Eyes:     Conjunctiva/sclera: Conjunctivae normal.  Cardiovascular:     Rate and Rhythm: Normal rate and regular rhythm.     Heart sounds: Normal heart sounds. No murmur heard.    No friction rub. No gallop.  Pulmonary:     Effort: Pulmonary effort is normal. No respiratory distress.     Breath sounds: Normal breath sounds. No wheezing or rales.  Chest:     Chest wall: No tenderness.  Abdominal:     General: Bowel sounds are normal.     Palpations: Abdomen is soft.     Tenderness: There is no abdominal tenderness.  Musculoskeletal:     Cervical back: Neck supple.  Lymphadenopathy:     Cervical: No cervical adenopathy.  Skin:    General: Skin is warm and dry.     Findings: No rash.  Neurological:     Mental Status: He is alert and oriented to person, place, and time.          02/20/2023    9:21 AM 10/28/2022   11:00 AM 07/11/2022    9:20 AM 03/17/2022    1:51 PM 12/08/2020   10:56 AM  Depression screen PHQ 2/9  Decreased Interest 0 0 0 0 0  Down, Depressed, Hopeless 0 0 0 0 0  PHQ - 2 Score 0 0 0 0 0         No data to display           The 10-year ASCVD risk score (Arnett DK, et al., 2019) is: 22.9%   Values used to calculate the score:     Age: 59 years     Clincally relevant sex: Male     Is Non-Hispanic African American: No     Diabetic: No     Tobacco smoker: Yes     Systolic  Blood Pressure: 138 mmHg     Is BP treated: No     HDL Cholesterol: 50 mg/dL     Total Cholesterol: 252 mg/dL      Assessment & Plan:    Patient Active Problem List   Diagnosis Date Noted   Lumbar back pain 02/20/2023   Healthcare maintenance 07/11/2022   Cardiovascular disease 07/11/2022   Neck pain 03/17/2022   Fall at home, sequela 01/12/2021   Headache 12/25/2020   Acute metabolic encephalopathy 11/26/2020   Transaminitis 11/26/2020   Closed left arm  fracture 12/23/2019   CKD (chronic kidney disease) stage 3, GFR 30-59 ml/min (HCC) 12/23/2019   Insomnia 12/23/2019   Hyperglycemia 09/18/2018   Seasonal allergies 01/31/2018   Anorexia 07/15/2015   Otitis media 02/09/2015   Loss of weight 12/24/2012   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 Flannagan continues to have well-controlled virus with good adherence and tolerance to Symtuza  and Tivicay .  Reviewed previous lab work and discussed plan of care and U equals U.  No problems obtaining medication from the pharmacy and covered by Medicare.  Social determinants of health reviewed with no interventions indicated.  Check blood work.  Continue current dose of Tivicay  and Symtuza .  Plan for follow-up in 4 months or sooner if needed with lab work on the same day.      Relevant Medications   dolutegravir  (TIVICAY ) 50 MG tablet   Darunavir -Cobicistat -Emtricitabine -Tenofovir  Alafenamide (SYMTUZA ) 800-150-200-10 MG TABS   Other Relevant Orders   Comprehensive metabolic panel with GFR   HIV-1 RNA quant-no reflex-bld   T-helper cell (CD4)- ([facility] clinic only)   TOBACCO ABUSE   Mr. Fouse continues to smoke tobacco out approximately 1/4  pack/day.  Interested in tobacco cessation through nicotine  patches following discussion.  These may or may not be covered by insurance with instructions provided in after visit summary on how to progress.  If unsuccessful may need to consider bupropion as alternative.  Resources provided in after visit summary.      Healthcare maintenance   Discussed importance of safe sexual practice and condom use. Condoms and site specific STD testing offered.  Recently updated COVID and influenza vaccines at [facility].  No additional vaccines needed at this time. Routine dental care up-to-date. Colon cancer screening up-to-date.      Other Visit Diagnoses       Cigarette nicotine  dependence, uncomplicated    -  Primary   Relevant Medications   nicotine  (NICODERM CQ  - DOSED IN MG/24 HOURS) 14 mg/24hr patch     Pharmacologic therapy       Relevant Orders   Lipid Profile        I am having Lucy Santor start on nicotine . I am also having him maintain his Pyridoxine HCl (VITAMIN B-6 PO), gabapentin , rosuvastatin , hydrOXYzine , ketoconazole , ibuprofen , Tivicay , and Symtuza .   Meds ordered this encounter  Medications   dolutegravir  (TIVICAY ) 50 MG tablet    Sig: Take 1 tablet (50 mg total) by mouth daily. Take with Symtuza .    Dispense:  30 tablet    Refill:  5    Fill with symtuza     Supervising Provider:   SNIDER, CYNTHIA [phone]    Prescription Type::   Renewal   Darunavir -Cobicistat -Emtricitabine -Tenofovir  Alafenamide (SYMTUZA ) 800-150-200-10 MG TABS    Sig: Take 1 tablet by mouth daily with breakfast. Take with Tivicay .    Dispense:  30 tablet    Refill:  5    Fill with tivicay     Supervising Provider:   SNIDER, CYNTHIA [phone]    Prescription Type::   Renewal   nicotine  (NICODERM CQ  - DOSED IN MG/24 HOURS) 14 mg/24hr patch    Sig: Place 1 patch (14 mg total) onto the skin  daily.    Dispense:  42 patch    Refill:  0    Substitution for [facility] permissible    Supervising Provider:    LUIZ CHANNEL [[id number]]     Follow-up: Return in about 4 months (around 01/21/2025). or sooner if needed.    Cathlyn July, MSN, FNP-C Nurse Practitioner [facility] for Infectious Disease [facility] Main number: [phone]

## 2024-09-23 NOTE — Assessment & Plan Note (Signed)
 Discussed importance of safe sexual practice and condom use. Condoms and site specific STD testing offered.  Recently updated COVID and influenza vaccines at [facility].  No additional vaccines needed at this time. Routine dental care up-to-date. Colon cancer screening up-to-date.

## 2024-09-23 NOTE — Assessment & Plan Note (Signed)
 Mr. Kolek continues to smoke tobacco out approximately 1/4 pack/day.  Interested in tobacco cessation through nicotine  patches following discussion.  These may or may not be covered by insurance with instructions provided in after visit summary on how to progress.  If unsuccessful may need to consider bupropion as alternative.  Resources provided in after visit summary.

## 2024-09-23 NOTE — Patient Instructions (Addendum)
 Nice to see you.  We will check your lab work today.  Continue to take your medication daily as prescribed.  Refills have been sent to the pharmacy.  For nicotiene patches start with 14 mg (Stage 2) for 6 weeks then go down to 7 mg (Stage 3) for 2 weeks.   Plan for follow up in 4 months or sooner if needed with lab work on the same day.  Have a great day and stay safe!

## 2024-09-23 NOTE — Assessment & Plan Note (Signed)
 Mr. Galeno continues to have well-controlled virus with good adherence and tolerance to Symtuza  and Tivicay .  Reviewed previous lab work and discussed plan of care and U equals U.  No problems obtaining medication from the pharmacy and covered by Medicare.  Social determinants of health reviewed with no interventions indicated.  Check blood work.  Continue current dose of Tivicay  and Symtuza .  Plan for follow-up in 4 months or sooner if needed with lab work on the same day.

## 2024-09-24 LAB — T-HELPER CELL (CD4) - (RCID CLINIC ONLY)
CD4 % Helper T Cell: 18 % — ABNORMAL LOW (ref 33–65)
CD4 T Cell Abs: 427 /uL (ref 400–1790)

## 2024-09-26 LAB — LIPID PANEL
Cholesterol: 285 mg/dL — ABNORMAL HIGH (ref <200)
HDL: 65 mg/dL (ref >=40)
LDL Cholesterol (Calc): 187 mg/dL — ABNORMAL HIGH
Non-HDL Cholesterol (Calc): 220 mg/dL — ABNORMAL HIGH (ref <130)
Total CHOL/HDL Ratio: 4.4 (calc) (ref <5.0)
Triglycerides: 161 mg/dL — ABNORMAL HIGH (ref <150)

## 2024-09-26 LAB — COMPREHENSIVE METABOLIC PANEL WITH GFR
AG Ratio: 1.5 (calc) (ref 1.0–2.5)
ALT: 10 U/L (ref 9–46)
AST: 17 U/L (ref 10–35)
Albumin: 4.4 g/dL (ref 3.6–5.1)
Alkaline phosphatase (APISO): 85 U/L (ref 35–144)
BUN: 21 mg/dL (ref 7–25)
CO2: 27 mmol/L (ref 20–32)
Calcium: 9.4 mg/dL (ref 8.6–10.3)
Chloride: 102 mmol/L (ref 98–110)
Creat: 1.13 mg/dL (ref 0.70–1.35)
Globulin: 3 g/dL (ref 1.9–3.7)
Glucose, Bld: 114 mg/dL — ABNORMAL HIGH (ref 65–99)
Potassium: 4.6 mmol/L (ref 3.5–5.3)
Sodium: 138 mmol/L (ref 135–146)
Total Bilirubin: 0.3 mg/dL (ref 0.2–1.2)
Total Protein: 7.4 g/dL (ref 6.1–8.1)
eGFR: 72 mL/min/1.73m2 (ref >=60)

## 2024-09-26 LAB — HIV-1 RNA QUANT-NO REFLEX-BLD
HIV 1 RNA Quant: NOT DETECTED {copies}/mL
HIV-1 RNA Quant, Log: NOT DETECTED {Log_copies}/mL

## 2024-10-07 ENCOUNTER — Ambulatory Visit

## 2024-10-07 ENCOUNTER — Other Ambulatory Visit: Payer: Self-pay

## 2024-10-26 ENCOUNTER — Other Ambulatory Visit: Payer: Self-pay | Admitting: Neurology

## 2024-11-02 ENCOUNTER — Encounter (HOSPITAL_COMMUNITY): Payer: Self-pay | Admitting: Emergency Medicine

## 2024-11-02 ENCOUNTER — Ambulatory Visit (HOSPITAL_COMMUNITY): Admission: EM | Admit: 2024-11-02 | Discharge: 2024-11-02 | Disposition: A | Discharge status: Home / Self Care

## 2024-11-02 DIAGNOSIS — R21 Rash and other nonspecific skin eruption: Secondary | ICD-10-CM | POA: Diagnosis not present

## 2024-11-02 MED ORDER — CETIRIZINE HCL 10 MG PO TABS: 10.0000 mg | ORAL_TABLET | Freq: Every day | ORAL | 1 refills | Status: AC

## 2024-11-02 MED ORDER — HYDROXYZINE HCL 25 MG PO TABS: 25.0000 mg | ORAL_TABLET | Freq: Four times a day (QID) | ORAL | 2 refills | Status: AC

## 2024-11-02 MED ORDER — PREDNISONE 20 MG PO TABS: ORAL_TABLET | ORAL | 0 refills | Status: AC

## 2024-11-02 NOTE — Discharge Instructions (Signed)
" °  1. Rash and nonspecific skin eruption (Primary) - hydrOXYzine  (ATARAX ) 25 MG tablet; Take 1 tablet (25 mg total) by mouth every 6 (six) hours.  Dispense: 30 tablet; Refill: 2 - cetirizine  (ZYRTEC ) 10 MG tablet; Take 1 tablet (10 mg total) by mouth daily.  Dispense: 60 tablet; Refill: 1 - predniSONE  (DELTASONE ) 20 MG tablet; Take 3 tablets (60 mg total) by mouth daily for 3 days, THEN 2 tablets (40 mg total) daily for 4 days, THEN 1 tablet (20 mg total) daily for 3 days.  Dispense: 20 tablet; Refill: 0  -Continue to monitor symptoms for any change in severity if there is any escalation of current symptoms or development of new symptoms follow-up in ER for further evaluation and management. "

## 2024-11-02 NOTE — ED Provider Notes (Signed)
 " [facility] [place]  Note:  This document was prepared using Dragon voice recognition software and may include unintentional dictation errors.  MRN: [id number] DOB: 09/15/1961  Subjective:   Eric Jefferson is a 64 y.o. male presenting for refill of hydroxyzine  and prednisone  for pruritic rash x 2 months.  Patient reports that he had been taking medication which seemed to help symptoms.  Patient ran out of medication and rash returned.  Patient reports that rash is over his back, bilateral upper legs, abdomen and is extremely pruritic.  Patient denies any pain, fever, body aches, sore throat, chest pain, shortness of breath.  Current Medications[1]   Allergies[2]  Past Medical History:  Diagnosis Date   Arthritis    Diabetes (HCC)    02-26-20 pt denies   Headache    HIV (human immunodeficiency virus infection) (HCC)    TB (pulmonary tuberculosis)    dx in 2009- took years worth of medication for this     History reviewed. No pertinent surgical history.  Family History  Problem Relation Age of Onset   Healthy Mother    Healthy Father    Colon cancer Neg Hx    Esophageal cancer Neg Hx    Rectal cancer Neg Hx    Stomach cancer Neg Hx     Social History[3]  ROS Refer to HPI for ROS details.  Objective:    Vitals: BP 117/76 (BP Location: Right Arm)   Pulse 70   Temp 98.3 F (36.8 C) (Oral)   Resp 15   SpO2 98%   Physical Exam Vitals and nursing note reviewed.  Constitutional:      General: He is not in acute distress.    Appearance: Normal appearance. He is well-developed. He is not ill-appearing, toxic-appearing or diaphoretic.  HENT:     Head: Normocephalic.  Cardiovascular:     Rate and Rhythm: Normal rate.  Pulmonary:     Effort: Pulmonary effort is normal. No respiratory distress.     Breath sounds: No stridor. No wheezing.  Skin:    General: Skin is warm and dry.     Capillary Refill: Capillary refill takes less than 2 seconds.     Findings:  Erythema and rash present. Rash is urticarial.  Neurological:     General: No focal deficit present.     Mental Status: He is alert and oriented to person, place, and time.  Psychiatric:        Mood and Affect: Mood normal.        Behavior: Behavior normal.     Procedures  No results found for this or any previous visit (from the past 24 hours).  Assessment and Plan :     Discharge Instructions       1. Rash and nonspecific skin eruption (Primary) - hydrOXYzine  (ATARAX ) 25 MG tablet; Take 1 tablet (25 mg total) by mouth every 6 (six) hours.  Dispense: 30 tablet; Refill: 2 - cetirizine  (ZYRTEC ) 10 MG tablet; Take 1 tablet (10 mg total) by mouth daily.  Dispense: 60 tablet; Refill: 1 - predniSONE  (DELTASONE ) 20 MG tablet; Take 3 tablets (60 mg total) by mouth daily for 3 days, THEN 2 tablets (40 mg total) daily for 4 days, THEN 1 tablet (20 mg total) daily for 3 days.  Dispense: 20 tablet; Refill: 0  -Continue to monitor symptoms for any change in severity if there is any escalation of current symptoms or development of new symptoms follow-up in ER for further evaluation and management.  Artavius Stearns B [place]    [1] No current facility-administered medications for this encounter.  Current Outpatient Medications:    cetirizine  (ZYRTEC ) 10 MG tablet, Take 1 tablet (10 mg total) by mouth daily., Disp: 60 tablet, Rfl: 1   predniSONE  (DELTASONE ) 20 MG tablet, Take 3 tablets (60 mg total) by mouth daily for 3 days, THEN 2 tablets (40 mg total) daily for 4 days, THEN 1 tablet (20 mg total) daily for 3 days., Disp: 20 tablet, Rfl: 0   Darunavir -Cobicistat -Emtricitabine -Tenofovir  Alafenamide (SYMTUZA ) 800-150-200-10 MG TABS, Take 1 tablet by mouth daily with breakfast. Take with Tivicay ., Disp: 30 tablet, Rfl: 5   dolutegravir  (TIVICAY ) 50 MG tablet, Take 1 tablet (50 mg total) by mouth daily. Take with Symtuza ., Disp: 30 tablet, Rfl: 5   gabapentin  (NEURONTIN ) 300 MG capsule, Take 1  capsule (300 mg total) by mouth at bedtime., Disp: 30 capsule, Rfl: 11   hydrOXYzine  (ATARAX ) 25 MG tablet, Take 1 tablet (25 mg total) by mouth every 6 (six) hours., Disp: 30 tablet, Rfl: 2   ketoconazole  (NIZORAL ) 2 % cream, Apply 1 Application topically daily., Disp: 15 g, Rfl: 0   nicotine  (NICODERM CQ  - DOSED IN MG/24 HOURS) 14 mg/24hr patch, Place 1 patch (14 mg total) onto the skin daily., Disp: 42 patch, Rfl: 0   Pyridoxine HCl (VITAMIN B-6 PO), Take by mouth., Disp: , Rfl:    rosuvastatin  (CRESTOR ) 10 MG tablet, Take 1 tablet (10 mg total) by mouth daily., Disp: 90 tablet, Rfl: 1 [2]  Allergies Allergen Reactions   Soy Allergy (Obsolete)     Upset stomach only- no other issues  [3]  Social History Tobacco Use   Smoking status: Every Day    Current packs/day: 0.25    Average packs/day: 0.3 packs/day for 30.0 years (7.5 ttl pk-yrs)    Types: Cigarettes   Smokeless tobacco: Never   Tobacco comments:    3-4 cigarettes a day  Vaping Use   Vaping status: Never Used  Substance Use Topics   Alcohol use: Not Currently   Drug use: No     Aurea Goodell B, NP 11/02/24 1223  "

## 2024-11-02 NOTE — ED Triage Notes (Signed)
 Used interpretor [employer] [id number]  Pt requesting refills of Hydroxyzine  25mg  and Prednisone  for rash and itching that had for 2 months.  Rash on back, legs, and abdomen.

## 2025-01-21 ENCOUNTER — Ambulatory Visit: Admitting: Family
